# Patient Record
Sex: Male | Born: 1955 | Race: Black or African American | Hispanic: No | Smoking: Former smoker
Health system: Southern US, Community
[De-identification: ages and names within clinical notes are randomized; demographics above are authoritative.]

## PROBLEM LIST (undated history)

## (undated) DIAGNOSIS — M109 Gout, unspecified: Secondary | ICD-10-CM

## (undated) DIAGNOSIS — E119 Type 2 diabetes mellitus without complications: Secondary | ICD-10-CM

## (undated) DIAGNOSIS — N189 Chronic kidney disease, unspecified: Secondary | ICD-10-CM

## (undated) DIAGNOSIS — I10 Essential (primary) hypertension: Secondary | ICD-10-CM

## (undated) DIAGNOSIS — E785 Hyperlipidemia, unspecified: Secondary | ICD-10-CM

## (undated) DIAGNOSIS — N183 Chronic kidney disease, stage 3 (moderate): Secondary | ICD-10-CM

## (undated) DIAGNOSIS — I129 Hypertensive chronic kidney disease with stage 1 through stage 4 chronic kidney disease, or unspecified chronic kidney disease: Secondary | ICD-10-CM

## (undated) HISTORY — PX: PROSTATE SURGERY: SHX751

## (undated) HISTORY — DX: Hyperlipidemia, unspecified: E78.5

## (undated) HISTORY — DX: Type 2 diabetes mellitus without complications: E11.9

## (undated) HISTORY — DX: Essential (primary) hypertension: I10

## (undated) HISTORY — DX: Hypertensive chronic kidney disease with stage 1 through stage 4 chronic kidney disease, or unspecified chronic kidney disease: I12.9

## (undated) HISTORY — DX: Gout, unspecified: M10.9

## (undated) HISTORY — DX: Chronic kidney disease, stage 3 (moderate): N18.3

## (undated) HISTORY — DX: Chronic kidney disease, unspecified: N18.9

## (undated) HISTORY — PX: OTHER SURGICAL HISTORY: SHX169

---

## 2010-09-20 LAB — HM COLONOSCOPY

## 2013-10-26 ENCOUNTER — Ambulatory Visit: Payer: Self-pay | Admitting: Family Medicine

## 2013-10-26 LAB — BASIC METABOLIC PANEL
ANION GAP: 8 (ref 7–16)
BUN: 13 mg/dL (ref 7–18)
CO2: 25 mmol/L (ref 21–32)
CREATININE: 1.46 mg/dL — AB (ref 0.60–1.30)
Calcium, Total: 9 mg/dL (ref 8.5–10.1)
Chloride: 105 mmol/L (ref 98–107)
EGFR (African American): >60
EGFR (Non-African Amer.): 52 — ABNORMAL LOW
Glucose: 113 mg/dL — ABNORMAL HIGH (ref 65–99)
Osmolality: 277 (ref 275–301)
POTASSIUM: 4.2 mmol/L (ref 3.5–5.1)
Sodium: 138 mmol/L (ref 136–145)

## 2014-10-19 LAB — PSA

## 2015-03-06 ENCOUNTER — Other Ambulatory Visit: Payer: Self-pay | Admitting: Unknown Physician Specialty

## 2015-03-07 NOTE — Telephone Encounter (Signed)
Creatinine just under 1.5 for the last year and a half; reviewed last kidney doctor note; last visit Feb; next visit August; Rx approved

## 2015-04-19 ENCOUNTER — Ambulatory Visit: Payer: Self-pay | Admitting: Unknown Physician Specialty

## 2015-04-24 ENCOUNTER — Ambulatory Visit (INDEPENDENT_AMBULATORY_CARE_PROVIDER_SITE_OTHER): Payer: BLUE CROSS/BLUE SHIELD | Admitting: Unknown Physician Specialty

## 2015-04-24 ENCOUNTER — Encounter: Payer: Self-pay | Admitting: Unknown Physician Specialty

## 2015-04-24 VITALS — BP 152/94 | HR 97 | Temp 99.1°F | Ht 69.6 in | Wt 216.0 lb

## 2015-04-24 DIAGNOSIS — N183 Chronic kidney disease, stage 3 unspecified: Secondary | ICD-10-CM | POA: Insufficient documentation

## 2015-04-24 DIAGNOSIS — I129 Hypertensive chronic kidney disease with stage 1 through stage 4 chronic kidney disease, or unspecified chronic kidney disease: Secondary | ICD-10-CM | POA: Insufficient documentation

## 2015-04-24 DIAGNOSIS — R7301 Impaired fasting glucose: Secondary | ICD-10-CM

## 2015-04-24 DIAGNOSIS — E785 Hyperlipidemia, unspecified: Secondary | ICD-10-CM | POA: Diagnosis not present

## 2015-04-24 DIAGNOSIS — N189 Chronic kidney disease, unspecified: Secondary | ICD-10-CM | POA: Diagnosis not present

## 2015-04-24 DIAGNOSIS — E1169 Type 2 diabetes mellitus with other specified complication: Secondary | ICD-10-CM | POA: Insufficient documentation

## 2015-04-24 DIAGNOSIS — N1832 Chronic kidney disease, stage 3b: Secondary | ICD-10-CM | POA: Insufficient documentation

## 2015-04-24 DIAGNOSIS — E119 Type 2 diabetes mellitus without complications: Secondary | ICD-10-CM | POA: Diagnosis not present

## 2015-04-24 DIAGNOSIS — Z8739 Personal history of other diseases of the musculoskeletal system and connective tissue: Secondary | ICD-10-CM | POA: Insufficient documentation

## 2015-04-24 DIAGNOSIS — M109 Gout, unspecified: Secondary | ICD-10-CM

## 2015-04-24 DIAGNOSIS — E1159 Type 2 diabetes mellitus with other circulatory complications: Secondary | ICD-10-CM | POA: Insufficient documentation

## 2015-04-24 DIAGNOSIS — I10 Essential (primary) hypertension: Secondary | ICD-10-CM

## 2015-04-24 DIAGNOSIS — R7309 Other abnormal glucose: Secondary | ICD-10-CM

## 2015-04-24 HISTORY — DX: Hypertensive chronic kidney disease with stage 1 through stage 4 chronic kidney disease, or unspecified chronic kidney disease: I12.9

## 2015-04-24 HISTORY — DX: Type 2 diabetes mellitus without complications: E11.9

## 2015-04-24 HISTORY — DX: Chronic kidney disease, stage 3 unspecified: N18.30

## 2015-04-24 HISTORY — DX: Gout, unspecified: M10.9

## 2015-04-24 LAB — LIPID PANEL PICCOLO, WAIVED
CHOLESTEROL PICCOLO, WAIVED: 173 mg/dL (ref <200)
Chol/HDL Ratio Piccolo,Waive: 4.3 mg/dL
HDL Chol Piccolo, Waived: 40 mg/dL — ABNORMAL LOW (ref >59)
LDL Chol Calc Piccolo Waived: 61 mg/dL (ref <100)
TRIGLYCERIDES PICCOLO,WAIVED: 357 mg/dL — AB (ref <150)
VLDL Chol Calc Piccolo,Waive: 71 mg/dL — ABNORMAL HIGH (ref <30)

## 2015-04-24 LAB — BAYER DCA HB A1C WAIVED: HB A1C (BAYER DCA - WAIVED): 6 % (ref <7.0)

## 2015-04-24 NOTE — Progress Notes (Signed)
BP 152/94 mmHg  Pulse 97  Temp(Src) 99.1 F (37.3 C)  Ht 5' 9.6" (1.768 m)  Wt 216 lb (97.977 kg)  BMI 31.34 kg/m2  SpO2 97%   Subjective:    Patient ID: Fernando Frederick, male    DOB: 26-Nov-1955, 59 y.o.   MRN: [id number]  HPI: Fernando Frederick is a 59 y.o. male  Chief Complaint  Patient presents with  . Diabetes  . Hyperlipidemia  . Hypertension   1.  HYPERTENSION / HYPERLIPIDEMIA BP is elevated.  States it was a stressful day at work.   Patient is requesting refill(s). Satisfied with current treatment?  yes  H6  Duration of hypertension:  chronic  H4  BP monitoring frequency:  not checking.  H5  Duration of hyperlipidemia:  chronic  H4  Aspirin:  Aspirin Therapy Not applicable on 93/57/01 Recent stressors:  no  H6   Recurrent headaches:  no  R10 Visual changes:  no  R2  Palpitations:  no  R4  Dyspnea:  no  R5  Chest pain:  no  R4  Lower extremity edema:  no  R4 I do note a Creatnine of below 60 indicating CKD stage 3  Hyperglycemia Taking Metformin elevated blood sugar.  Hgb A1C is 6.0   Relevant past medical, surgical, family and social history reviewed and updated as indicated. Interim medical history since our last visit reviewed. Allergies and medications reviewed and updated.  Review of Systems  Per HPI unless specifically indicated above     Objective:    BP 152/94 mmHg  Pulse 97  Temp(Src) 99.1 F (37.3 C)  Ht 5' 9.6" (1.768 m)  Wt 216 lb (97.977 kg)  BMI 31.34 kg/m2  SpO2 97%  Wt Readings from Last 3 Encounters:  04/24/15 216 lb (97.977 kg)  10/19/14 211 lb (95.709 kg)    Physical Exam  Constitutional: He is oriented to person, place, and time. He appears well-developed and well-nourished. No distress.  HENT:  Head: Normocephalic and atraumatic.  Eyes: Conjunctivae and lids are normal. Right eye exhibits no discharge. Left eye exhibits no discharge. No scleral icterus.  Cardiovascular: Normal rate, regular rhythm and normal heart sounds.    Pulmonary/Chest: Effort normal and breath sounds normal. No respiratory distress.  Abdominal: Normal appearance and bowel sounds are normal. There is no splenomegaly or hepatomegaly.  Musculoskeletal: Normal range of motion.  Neurological: He is alert and oriented to person, place, and time.  Skin: Skin is intact. No rash noted. No pallor.  Psychiatric: He has a normal mood and affect. His behavior is normal. Judgment and thought content normal.      Assessment & Plan:   Problem List Items Addressed This Visit      Unprioritized   Benign hypertension with chronic kidney disease    Stable today.  Continue present medications      Relevant Orders   Comprehensive metabolic panel   Hyperlipidemia - Primary    Reviewed lipid level.  OK for non-fasting      Relevant Orders   Lipid Panel Piccolo, Waived   CKD (chronic kidney disease), stage III   Relevant Orders   Bayer DCA Hb A1c Waived   Diabetes    Hgb A1C is 6.0.  Continue present treatment       Other Visit Diagnoses    Impaired fasting glucose            Follow up plan: Return in about 6 months (around 10/24/2015) for physical.

## 2015-04-24 NOTE — Assessment & Plan Note (Signed)
Hgb A1C is 6.0.  Continue present treatment

## 2015-04-24 NOTE — Assessment & Plan Note (Signed)
Reviewed lipid level.  OK for non-fasting

## 2015-04-24 NOTE — Assessment & Plan Note (Signed)
Stable today.  Continue present medications 

## 2015-04-25 LAB — COMPREHENSIVE METABOLIC PANEL
A/G RATIO: 1.4 (ref 1.1–2.5)
ALT: 21 IU/L (ref 0–44)
AST: 21 IU/L (ref 0–40)
Albumin: 4.5 g/dL (ref 3.6–4.8)
Alkaline Phosphatase: 90 IU/L (ref 39–117)
BILIRUBIN TOTAL: 0.4 mg/dL (ref 0.0–1.2)
BUN/Creatinine Ratio: 12 (ref 10–22)
BUN: 16 mg/dL (ref 8–27)
CALCIUM: 10 mg/dL (ref 8.6–10.2)
CHLORIDE: 100 mmol/L (ref 97–108)
CO2: 21 mmol/L (ref 18–29)
Creatinine, Ser: 1.39 mg/dL — ABNORMAL HIGH (ref 0.76–1.27)
GFR calc Af Amer: 63 mL/min/{1.73_m2} (ref >59)
GFR, EST NON AFRICAN AMERICAN: 54 mL/min/{1.73_m2} — AB (ref >59)
Globulin, Total: 3.2 g/dL (ref 1.5–4.5)
Glucose: 99 mg/dL (ref 65–99)
POTASSIUM: 4.2 mmol/L (ref 3.5–5.2)
Sodium: 143 mmol/L (ref 134–144)
Total Protein: 7.7 g/dL (ref 6.0–8.5)

## 2015-05-02 ENCOUNTER — Telehealth: Payer: Self-pay | Admitting: Family Medicine

## 2015-05-02 ENCOUNTER — Ambulatory Visit: Payer: BLUE CROSS/BLUE SHIELD | Admitting: Family Medicine

## 2015-05-04 NOTE — Telephone Encounter (Signed)
errenous °

## 2015-06-26 ENCOUNTER — Other Ambulatory Visit: Payer: Self-pay

## 2015-06-26 NOTE — Telephone Encounter (Signed)
Called to ask patient about what pharmacy he uses because we got a refill request from [facility] and I dont have that listed as his pharmacy. A lady answered the phone and I asked for him to please call me back.

## 2015-06-28 MED ORDER — LISINOPRIL-HYDROCHLOROTHIAZIDE 20-12.5 MG PO TABS: 1.0000 | ORAL_TABLET | Freq: Every day | ORAL | Status: DC

## 2015-06-28 MED ORDER — AMLODIPINE BESYLATE 10 MG PO TABS: 10.0000 mg | ORAL_TABLET | Freq: Every day | ORAL | Status: DC

## 2015-06-28 MED ORDER — METFORMIN HCL 500 MG PO TABS: 500.0000 mg | ORAL_TABLET | Freq: Two times a day (BID) | ORAL | Status: DC

## 2015-06-28 NOTE — Telephone Encounter (Signed)
Called and spoke to patient's wife, who is on HIPPA according to practice partner, and she stated that the patient does use [facility].

## 2015-06-28 NOTE — Telephone Encounter (Signed)
Patient was last seen 04/24/15 and has appointment scheduled for 10/24/15. Pharmacy is [facility].

## 2015-10-24 ENCOUNTER — Ambulatory Visit (INDEPENDENT_AMBULATORY_CARE_PROVIDER_SITE_OTHER): Payer: BLUE CROSS/BLUE SHIELD | Admitting: Unknown Physician Specialty

## 2015-10-24 ENCOUNTER — Encounter: Payer: Self-pay | Admitting: Unknown Physician Specialty

## 2015-10-24 VITALS — BP 142/70 | HR 97 | Temp 98.6°F | Ht 69.1 in | Wt 222.8 lb

## 2015-10-24 DIAGNOSIS — E785 Hyperlipidemia, unspecified: Secondary | ICD-10-CM

## 2015-10-24 DIAGNOSIS — N183 Chronic kidney disease, stage 3 (moderate): Secondary | ICD-10-CM | POA: Diagnosis not present

## 2015-10-24 DIAGNOSIS — N189 Chronic kidney disease, unspecified: Secondary | ICD-10-CM

## 2015-10-24 DIAGNOSIS — E1122 Type 2 diabetes mellitus with diabetic chronic kidney disease: Secondary | ICD-10-CM | POA: Diagnosis not present

## 2015-10-24 DIAGNOSIS — I129 Hypertensive chronic kidney disease with stage 1 through stage 4 chronic kidney disease, or unspecified chronic kidney disease: Secondary | ICD-10-CM | POA: Diagnosis not present

## 2015-10-24 LAB — LIPID PANEL PICCOLO, WAIVED

## 2015-10-24 MED ORDER — ROSUVASTATIN CALCIUM 20 MG PO TABS: 20.0000 mg | ORAL_TABLET | Freq: Every day | ORAL | Status: DC

## 2015-10-24 MED ORDER — METFORMIN HCL 500 MG PO TABS: 500.0000 mg | ORAL_TABLET | Freq: Two times a day (BID) | ORAL | Status: DC

## 2015-10-24 MED ORDER — AMLODIPINE BESYLATE 10 MG PO TABS: 10.0000 mg | ORAL_TABLET | Freq: Every day | ORAL | Status: DC

## 2015-10-24 NOTE — Assessment & Plan Note (Signed)
Stable, continue present medications.   

## 2015-10-24 NOTE — Assessment & Plan Note (Signed)
Sample sent out as is lipemic and non-fasting.  Continue present meds

## 2015-10-24 NOTE — Assessment & Plan Note (Signed)
Hgb A1C is 6.1.  Continue present meds

## 2015-10-24 NOTE — Progress Notes (Signed)
Q`-  BP 142/70 mmHg  Pulse 97  Temp(Src) 98.6 F (37 C)  Ht 5' 9.1" (1.755 m)  Wt 222 lb 12.8 oz (101.061 kg)  BMI 32.81 kg/m2  SpO2 96%   Subjective:    Patient ID: Fernando Frederick, male    DOB: 04/25/1956, 60 y.o.   MRN: [id number]  HPI: Fernando Frederick is a 60 y.o. male  Chief Complaint  Patient presents with  . Diabetes  . Hyperlipidemia  . Hypertension   Diabetes:  Using medications without difficulties Non checking blood sugar Feet problems: none Blood Sugars averaging:not checking eye exam within last year  Hypertension:  Using medications without difficulty Average home BPs Not checking   Using medication without problems or lightheadedness No chest pain with exertion or shortness of breath No Edema  Elevated Cholesterol: Using medications without problems: No Muscle aches:    Relevant past medical, surgical, family and social history reviewed and updated as indicated. Interim medical history since our last visit reviewed. Allergies and medications reviewed and updated.  Review of Systems  Constitutional: Negative.   HENT: Negative.   Eyes: Negative.   Respiratory: Negative.   Cardiovascular: Negative.   Gastrointestinal: Negative.   Endocrine: Negative.   Genitourinary: Negative.   Skin: Negative.   Allergic/Immunologic: Negative.   Neurological: Negative.   Hematological: Negative.   Psychiatric/Behavioral: Negative.     Per HPI unless specifically indicated above     Objective:    BP 142/70 mmHg  Pulse 97  Temp(Src) 98.6 F (37 C)  Ht 5' 9.1" (1.755 m)  Wt 222 lb 12.8 oz (101.061 kg)  BMI 32.81 kg/m2  SpO2 96%  Wt Readings from Last 3 Encounters:  10/24/15 222 lb 12.8 oz (101.061 kg)  04/24/15 216 lb (97.977 kg)  10/19/14 211 lb (95.709 kg)    Physical Exam  Constitutional: He is oriented to person, place, and time. He appears well-developed and well-nourished. No distress.  HENT:  Head: Normocephalic and atraumatic.  Eyes:  Conjunctivae and lids are normal. Right eye exhibits no discharge. Left eye exhibits no discharge. No scleral icterus.  Neck: Normal range of motion. Neck supple. No JVD present. Carotid bruit is not present.  Cardiovascular: Normal rate, regular rhythm and normal heart sounds.   Pulmonary/Chest: Effort normal and breath sounds normal. No respiratory distress.  Abdominal: Normal appearance. There is no splenomegaly or hepatomegaly.  Musculoskeletal: Normal range of motion.  Neurological: He is alert and oriented to person, place, and time.  Skin: Skin is warm, dry and intact. No rash noted. No pallor.  Psychiatric: He has a normal mood and affect. His behavior is normal. Judgment and thought content normal.    Results for orders placed or performed in visit on 04/24/15  Lipid Panel Piccolo, [employer]  Result Value Ref Range   Cholesterol Piccolo, Waived 173 <200 mg/dL   HDL Chol Piccolo, Waived 40 (L) >59 mg/dL   Triglycerides Piccolo,Waived 357 (H) <150 mg/dL   Chol/HDL Ratio Piccolo,Waive 4.3 mg/dL   LDL Chol Calc Piccolo Waived 61 <100 mg/dL   VLDL Chol Calc Piccolo,Waive 71 (H) <30 mg/dL  Bayer DCA Hb A1c Waived  Result Value Ref Range   Bayer DCA Hb A1c Waived 6.0 <7.0 %  Comprehensive metabolic panel  Result Value Ref Range   Glucose 99 65 - 99 mg/dL   BUN 16 8 - 27 mg/dL   Creatinine, Ser 1.39 (H) 0.76 - 1.27 mg/dL   GFR calc non Af Amer 54 (L) >  59 mL/min/1.73   GFR calc Af Amer 63 >59 mL/min/1.73   BUN/Creatinine Ratio 12 10 - 22   Sodium 143 134 - 144 mmol/L   Potassium 4.2 3.5 - 5.2 mmol/L   Chloride 100 97 - 108 mmol/L   CO2 21 18 - 29 mmol/L   Calcium 10.0 8.6 - 10.2 mg/dL   Total Protein 7.7 6.0 - 8.5 g/dL   Albumin 4.5 3.6 - 4.8 g/dL   Globulin, Total 3.2 1.5 - 4.5 g/dL   Albumin/Globulin Ratio 1.4 1.1 - 2.5   Bilirubin Total 0.4 0.0 - 1.2 mg/dL   Alkaline Phosphatase 90 39 - 117 IU/L   AST 21 0 - 40 IU/L   ALT 21 0 - 44 IU/L      Assessment & Plan:    Problem List Items Addressed This Visit      Unprioritized   Benign hypertension with chronic kidney disease    Stable, continue present medications.        Relevant Medications   amLODipine (NORVASC) 10 MG tablet   rosuvastatin (CRESTOR) 20 MG tablet   Other Relevant Orders   Uric acid   Hyperlipidemia    Sample sent out as is lipemic and non-fasting.  Continue present meds      Relevant Medications   amLODipine (NORVASC) 10 MG tablet   rosuvastatin (CRESTOR) 20 MG tablet   Other Relevant Orders   Lipid Panel Piccolo, Waived   Diabetes ([place]) - Primary    Hgb A1C is 6.1.  Continue present meds      Relevant Medications   metFORMIN (GLUCOPHAGE) 500 MG tablet   rosuvastatin (CRESTOR) 20 MG tablet   Other Relevant Orders   Comprehensive metabolic panel   Bayer DCA Hb A1c Waived       Follow up plan: Return in about 6 months (around 04/22/2016) for physical.

## 2015-10-25 LAB — LIPID PANEL W/O CHOL/HDL RATIO
Cholesterol, Total: 155 mg/dL (ref 100–199)
HDL: 33 mg/dL — AB (ref >39)
TRIGLYCERIDES: 459 mg/dL — AB (ref 0–149)

## 2015-10-25 LAB — COMPREHENSIVE METABOLIC PANEL
ALK PHOS: 72 IU/L (ref 39–117)
ALT: 36 IU/L (ref 0–44)
AST: 34 IU/L (ref 0–40)
Albumin/Globulin Ratio: 1.6 (ref 1.1–2.5)
Albumin: 4.7 g/dL (ref 3.6–4.8)
BILIRUBIN TOTAL: 0.2 mg/dL (ref 0.0–1.2)
BUN/Creatinine Ratio: 14 (ref 10–22)
BUN: 23 mg/dL (ref 8–27)
CHLORIDE: 103 mmol/L (ref 96–106)
CO2: 21 mmol/L (ref 18–29)
CREATININE: 1.67 mg/dL — AB (ref 0.76–1.27)
Calcium: 9.9 mg/dL (ref 8.6–10.2)
GFR calc Af Amer: 50 mL/min/{1.73_m2} — ABNORMAL LOW (ref >59)
GFR calc non Af Amer: 44 mL/min/{1.73_m2} — ABNORMAL LOW (ref >59)
GLUCOSE: 102 mg/dL — AB (ref 65–99)
Globulin, Total: 3 g/dL (ref 1.5–4.5)
Potassium: 4.8 mmol/L (ref 3.5–5.2)
Sodium: 141 mmol/L (ref 134–144)
Total Protein: 7.7 g/dL (ref 6.0–8.5)

## 2015-10-25 LAB — SPECIMEN STATUS REPORT

## 2015-10-25 LAB — URIC ACID: URIC ACID: 10.7 mg/dL — AB (ref 3.7–8.6)

## 2015-10-26 LAB — BAYER DCA HB A1C WAIVED: HB A1C: 6.1 % (ref <7.0)

## 2016-01-09 ENCOUNTER — Other Ambulatory Visit: Payer: Self-pay | Admitting: Unknown Physician Specialty

## 2016-01-12 ENCOUNTER — Other Ambulatory Visit: Payer: Self-pay

## 2016-01-12 NOTE — Telephone Encounter (Signed)
Opened in error

## 2016-02-29 ENCOUNTER — Other Ambulatory Visit: Payer: Self-pay | Admitting: Unknown Physician Specialty

## 2016-03-02 ENCOUNTER — Other Ambulatory Visit: Payer: Self-pay | Admitting: Unknown Physician Specialty

## 2016-03-11 ENCOUNTER — Other Ambulatory Visit: Payer: Self-pay | Admitting: Unknown Physician Specialty

## 2016-04-23 ENCOUNTER — Other Ambulatory Visit: Payer: Self-pay

## 2016-04-23 ENCOUNTER — Ambulatory Visit (INDEPENDENT_AMBULATORY_CARE_PROVIDER_SITE_OTHER): Payer: BLUE CROSS/BLUE SHIELD | Admitting: Unknown Physician Specialty

## 2016-04-23 ENCOUNTER — Encounter: Payer: Self-pay | Admitting: Unknown Physician Specialty

## 2016-04-23 VITALS — BP 145/88 | HR 93 | Temp 99.0°F | Ht 69.5 in | Wt 213.6 lb

## 2016-04-23 DIAGNOSIS — E1122 Type 2 diabetes mellitus with diabetic chronic kidney disease: Secondary | ICD-10-CM | POA: Diagnosis not present

## 2016-04-23 DIAGNOSIS — N183 Chronic kidney disease, stage 3 unspecified: Secondary | ICD-10-CM

## 2016-04-23 DIAGNOSIS — E785 Hyperlipidemia, unspecified: Secondary | ICD-10-CM | POA: Diagnosis not present

## 2016-04-23 DIAGNOSIS — I10 Essential (primary) hypertension: Secondary | ICD-10-CM

## 2016-04-23 DIAGNOSIS — Z0000 Encounter for general adult medical examination without abnormal findings: Secondary | ICD-10-CM

## 2016-04-23 DIAGNOSIS — I129 Hypertensive chronic kidney disease with stage 1 through stage 4 chronic kidney disease, or unspecified chronic kidney disease: Secondary | ICD-10-CM | POA: Diagnosis not present

## 2016-04-23 DIAGNOSIS — N189 Chronic kidney disease, unspecified: Secondary | ICD-10-CM

## 2016-04-23 LAB — LIPID PANEL PICCOLO, WAIVED
CHOL/HDL RATIO PICCOLO,WAIVE: 2.9 mg/dL
CHOLESTEROL PICCOLO, WAIVED: 152 mg/dL (ref <200)
HDL Chol Piccolo, Waived: 53 mg/dL — ABNORMAL LOW (ref >59)
LDL Chol Calc Piccolo Waived: 66 mg/dL (ref <100)
TRIGLYCERIDES PICCOLO,WAIVED: 164 mg/dL — AB (ref <150)
VLDL Chol Calc Piccolo,Waive: 33 mg/dL — ABNORMAL HIGH (ref <30)

## 2016-04-23 LAB — HEMOGLOBIN A1C: Hemoglobin A1C: 6.4

## 2016-04-23 LAB — BAYER DCA HB A1C WAIVED: HB A1C: 6.4 % (ref <7.0)

## 2016-04-23 NOTE — Assessment & Plan Note (Signed)
Hgb A1C is 6.4.  Continue present medications

## 2016-04-23 NOTE — Assessment & Plan Note (Signed)
Stable, continue present medications.   

## 2016-04-23 NOTE — Progress Notes (Signed)
BP (!) 145/88 (BP Location: Left Arm, Cuff Size: Large)   Pulse 93   Temp 99 F (37.2 C)   Ht 5' 9.5" (1.765 m)   Wt 213 lb 9.6 oz (96.9 kg)   SpO2 98%   BMI 31.09 kg/m    Subjective:    Patient ID: Fernando Frederick, male    DOB: 1955/08/28, 59 y.o.   MRN: [id number]  HPI: Fernando Frederick is a 60 y.o. male  Chief Complaint  Patient presents with  . Annual Exam    HIV and Hep C orders entered   . Diabetes    pt states last eye exam in chart is correct    Pt is here for annual exam  Diabetes: Using medications without difficulties No hypoglycemic episodes No hyperglycemic episodes Feet problems: none Blood Sugars averaging:Not checking eye exam within last year no Last Hgb A1C: 6.1  Hypertension  Using medications without difficulty Average home BPs: not checking  Using medication without problems or lightheadedness No chest pain with exertion or shortness of breath No Edema  Elevated Cholesterol Using medications without problems No Muscle aches  Diet: Regular Exercise: Bicycle    Depression screen PHQ 2/9 04/23/2016  Decreased Interest 0  Down, Depressed, Hopeless 0  PHQ - 2 Score 0     Relevant past medical, surgical, family and social history reviewed and updated as indicated. Interim medical history since our last visit reviewed. Allergies and medications reviewed and updated.  Review of Systems  Constitutional: Negative.   HENT: Negative.   Eyes: Negative.   Respiratory: Negative.   Cardiovascular: Negative.   Gastrointestinal: Negative.   Endocrine: Negative.   Genitourinary: Negative.   Skin: Negative.   Allergic/Immunologic: Negative.   Neurological: Negative.   Hematological: Negative.   Psychiatric/Behavioral: Negative.     Per HPI unless specifically indicated above     Objective:    BP (!) 145/88 (BP Location: Left Arm, Cuff Size: Large)   Pulse 93   Temp 99 F (37.2 C)   Ht 5' 9.5" (1.765 m)   Wt 213 lb 9.6 oz (96.9 kg)    SpO2 98%   BMI 31.09 kg/m   Wt Readings from Last 3 Encounters:  04/23/16 213 lb 9.6 oz (96.9 kg)  10/24/15 222 lb 12.8 oz (101.1 kg)  04/24/15 216 lb (98 kg)    Physical Exam  Constitutional: He is oriented to person, place, and time. He appears well-developed and well-nourished.  HENT:  Head: Normocephalic.  Right Ear: Tympanic membrane, external ear and ear canal normal.  Left Ear: Tympanic membrane, external ear and ear canal normal.  Mouth/Throat: Uvula is midline, oropharynx is clear and moist and mucous membranes are normal.  Eyes: Pupils are equal, round, and reactive to light.  Cardiovascular: Normal rate, regular rhythm and normal heart sounds.  Exam reveals no gallop and no friction rub.   No murmur heard. Pulmonary/Chest: Effort normal and breath sounds normal. No respiratory distress.  Abdominal: Soft. Bowel sounds are normal. He exhibits no distension. There is no tenderness.  Genitourinary: Rectum normal and prostate normal.  Musculoskeletal: Normal range of motion.  Neurological: He is alert and oriented to person, place, and time. He has normal reflexes.  Skin: Skin is warm and dry.  Psychiatric: He has a normal mood and affect. His behavior is normal. Judgment and thought content normal.    Results for orders placed or performed in visit on 10/24/15  Uric acid  Result Value Ref Range  Uric Acid 10.7 (H) 3.7 - 8.6 mg/dL  Comprehensive metabolic panel  Result Value Ref Range   Glucose 102 (H) 65 - 99 mg/dL   BUN 23 8 - 27 mg/dL   Creatinine, Ser 1.67 (H) 0.76 - 1.27 mg/dL   GFR calc non Af Amer 44 (L) >59 mL/min/1.73   GFR calc Af Amer 50 (L) >59 mL/min/1.73   BUN/Creatinine Ratio 14 10 - 22   Sodium 141 134 - 144 mmol/L   Potassium 4.8 3.5 - 5.2 mmol/L   Chloride 103 96 - 106 mmol/L   CO2 21 18 - 29 mmol/L   Calcium 9.9 8.6 - 10.2 mg/dL   Total Protein 7.7 6.0 - 8.5 g/dL   Albumin 4.7 3.6 - 4.8 g/dL   Globulin, Total 3.0 1.5 - 4.5 g/dL    Albumin/Globulin Ratio 1.6 1.1 - 2.5   Bilirubin Total 0.2 0.0 - 1.2 mg/dL   Alkaline Phosphatase 72 39 - 117 IU/L   AST 34 0 - 40 IU/L   ALT 36 0 - 44 IU/L  Bayer DCA Hb A1c Waived  Result Value Ref Range   Bayer DCA Hb A1c Waived 6.1 <7.0 %  Lipid Panel Piccolo, Waived  Result Value Ref Range   Cholesterol Piccolo, Waived WILL FOLLOW    HDL Chol Piccolo, Waived WILL FOLLOW    Triglycerides Piccolo,Waived WILL FOLLOW    Chol/HDL Ratio Piccolo,Waive WILL FOLLOW    LDL Chol Calc Piccolo Waived WILL FOLLOW    VLDL Chol Calc Piccolo,Waive WILL FOLLOW   Lipid Panel w/o Chol/HDL Ratio  Result Value Ref Range   Cholesterol, Total 155 100 - 199 mg/dL   Triglycerides 459 (H) 0 - 149 mg/dL   HDL 33 (L) >39 mg/dL   VLDL Cholesterol Cal Comment 5 - 40 mg/dL   LDL Calculated Comment 0 - 99 mg/dL  Specimen status report  Result Value Ref Range   specimen status report Comment       Assessment & Plan:   Problem List Items Addressed This Visit      Unprioritized   Benign hypertension with chronic kidney disease    Stable, continue present medications.        CKD (chronic kidney disease), stage III    Lost to f/u with Nephrology.  Check GFR      Relevant Orders   Comprehensive metabolic panel   Diabetes ([facility])    Hgb A1C is 6.4.  Continue present medications      Relevant Orders   Bayer DCA Hb A1c Waived   Hyperlipidemia    Stable, continue present medications.        Relevant Orders   Lipid Panel Piccolo, Waived    Other Visit Diagnoses    Health care maintenance    -  Primary   Relevant Orders   Hepatitis C antibody   HIV antibody   TSH   Comprehensive metabolic panel   CBC with Differential/Platelet   PSA   Essential hypertension           Follow up plan: Return in about 6 months (around 10/23/2016).

## 2016-04-23 NOTE — Assessment & Plan Note (Signed)
Lost to f/u with Nephrology.  Check GFR

## 2016-04-24 ENCOUNTER — Encounter: Payer: Self-pay | Admitting: Family Medicine

## 2016-04-24 LAB — COMPREHENSIVE METABOLIC PANEL
ALBUMIN: 4.8 g/dL (ref 3.6–4.8)
ALT: 19 IU/L (ref 0–44)
AST: 25 IU/L (ref 0–40)
Albumin/Globulin Ratio: 1.7 (ref 1.2–2.2)
Alkaline Phosphatase: 72 IU/L (ref 39–117)
BUN / CREAT RATIO: 13 (ref 10–24)
BUN: 20 mg/dL (ref 8–27)
Bilirubin Total: 0.3 mg/dL (ref 0.0–1.2)
CALCIUM: 9.9 mg/dL (ref 8.6–10.2)
CO2: 22 mmol/L (ref 18–29)
CREATININE: 1.56 mg/dL — AB (ref 0.76–1.27)
Chloride: 101 mmol/L (ref 96–106)
GFR, EST AFRICAN AMERICAN: 54 mL/min/{1.73_m2} — AB (ref >59)
GFR, EST NON AFRICAN AMERICAN: 47 mL/min/{1.73_m2} — AB (ref >59)
GLOBULIN, TOTAL: 2.8 g/dL (ref 1.5–4.5)
Glucose: 103 mg/dL — ABNORMAL HIGH (ref 65–99)
Potassium: 5 mmol/L (ref 3.5–5.2)
SODIUM: 140 mmol/L (ref 134–144)
Total Protein: 7.6 g/dL (ref 6.0–8.5)

## 2016-04-24 LAB — CBC WITH DIFFERENTIAL/PLATELET
BASOS: 0 %
Basophils Absolute: 0 10*3/uL (ref 0.0–0.2)
EOS (ABSOLUTE): 0.3 10*3/uL (ref 0.0–0.4)
EOS: 5 %
HEMATOCRIT: 38 % (ref 37.5–51.0)
Hemoglobin: 12.7 g/dL (ref 12.6–17.7)
IMMATURE GRANULOCYTES: 0 %
Immature Grans (Abs): 0 10*3/uL (ref 0.0–0.1)
LYMPHS ABS: 2 10*3/uL (ref 0.7–3.1)
Lymphs: 36 %
MCH: 27.1 pg (ref 26.6–33.0)
MCHC: 33.4 g/dL (ref 31.5–35.7)
MCV: 81 fL (ref 79–97)
MONOS ABS: 0.5 10*3/uL (ref 0.1–0.9)
Monocytes: 9 %
NEUTROS ABS: 2.6 10*3/uL (ref 1.4–7.0)
Neutrophils: 50 %
Platelets: 320 10*3/uL (ref 150–379)
RBC: 4.68 x10E6/uL (ref 4.14–5.80)
RDW: 14.1 % (ref 12.3–15.4)
WBC: 5.4 10*3/uL (ref 3.4–10.8)

## 2016-04-24 LAB — TSH: TSH: 0.948 u[IU]/mL (ref 0.450–4.500)

## 2016-04-24 LAB — HIV ANTIBODY (ROUTINE TESTING W REFLEX): HIV Screen 4th Generation wRfx: NONREACTIVE

## 2016-04-24 LAB — PSA: Prostate Specific Ag, Serum: 3.1 ng/mL (ref 0.0–4.0)

## 2016-04-24 LAB — HEPATITIS C ANTIBODY: Hep C Virus Ab: <0.1 s/co ratio (ref 0.0–0.9)

## 2016-06-05 ENCOUNTER — Other Ambulatory Visit: Payer: Self-pay | Admitting: Unknown Physician Specialty

## 2016-06-05 MED ORDER — LISINOPRIL-HYDROCHLOROTHIAZIDE 20-12.5 MG PO TABS: 1.0000 | ORAL_TABLET | Freq: Every day | ORAL | 1 refills | Status: DC

## 2016-06-05 MED ORDER — ROSUVASTATIN CALCIUM 20 MG PO TABS: 20.0000 mg | ORAL_TABLET | Freq: Every day | ORAL | 1 refills | Status: DC

## 2016-06-05 MED ORDER — METFORMIN HCL 500 MG PO TABS: 500.0000 mg | ORAL_TABLET | Freq: Two times a day (BID) | ORAL | 1 refills | Status: DC

## 2016-06-05 MED ORDER — AMLODIPINE BESYLATE 10 MG PO TABS: 10.0000 mg | ORAL_TABLET | Freq: Every day | ORAL | 1 refills | Status: DC

## 2016-06-05 NOTE — Telephone Encounter (Signed)
Pt's wife called stated the patient would like all of his prescriptions sent to [facility]. They are no longer using [facility] due to insurance purposes. Thanks.

## 2016-06-05 NOTE — Telephone Encounter (Signed)
Routing to provider  

## 2016-10-25 ENCOUNTER — Ambulatory Visit: Payer: BLUE CROSS/BLUE SHIELD | Admitting: Unknown Physician Specialty

## 2016-10-28 ENCOUNTER — Encounter: Payer: Self-pay | Admitting: Unknown Physician Specialty

## 2016-10-28 ENCOUNTER — Ambulatory Visit (INDEPENDENT_AMBULATORY_CARE_PROVIDER_SITE_OTHER): Payer: BLUE CROSS/BLUE SHIELD | Admitting: Unknown Physician Specialty

## 2016-10-28 VITALS — BP 138/87 | HR 87 | Temp 99.2°F | Ht 69.5 in | Wt 214.7 lb

## 2016-10-28 DIAGNOSIS — E785 Hyperlipidemia, unspecified: Secondary | ICD-10-CM | POA: Diagnosis not present

## 2016-10-28 DIAGNOSIS — N183 Chronic kidney disease, stage 3 (moderate): Secondary | ICD-10-CM | POA: Diagnosis not present

## 2016-10-28 DIAGNOSIS — E1122 Type 2 diabetes mellitus with diabetic chronic kidney disease: Secondary | ICD-10-CM

## 2016-10-28 DIAGNOSIS — I129 Hypertensive chronic kidney disease with stage 1 through stage 4 chronic kidney disease, or unspecified chronic kidney disease: Secondary | ICD-10-CM | POA: Diagnosis not present

## 2016-10-28 LAB — BAYER DCA HB A1C WAIVED: HB A1C (BAYER DCA - WAIVED): 6.4 % (ref <7.0)

## 2016-10-28 NOTE — Assessment & Plan Note (Signed)
Check lipid panel  

## 2016-10-28 NOTE — Progress Notes (Signed)
BP 138/87 (BP Location: Left Arm, Patient Position: Sitting, Cuff Size: Large)   Pulse 87   Temp 99.2 F (37.3 C)   Ht 5' 9.5" (1.765 m) Comment: pt had shoes on  Wt 214 lb 11.2 oz (97.4 kg) Comment: pt had shoes on  SpO2 97%   BMI 31.25 kg/m    Subjective:    Patient ID: Fernando Frederick, male    DOB: 1955-12-18, 61 y.o.   MRN: [id number]  HPI: Fernando Frederick is a 61 y.o. male  Chief Complaint  Patient presents with  . Diabetes    pt states he had eye exam in January, will fax form to [facility]   . Hyperlipidemia  . Hypertension   Diabetes: Using medications without difficulties No hypoglycemic episodes No hyperglycemic episodes Feet problems: Blood Sugars averaging: Not checking eye exam within last year Last Hgb A1C: 6.4  Hypertension  Using medications without difficulty Average home BPs Not checking   Using medication without problems or lightheadedness No chest pain with exertion or shortness of breath No Edema  Elevated Cholesterol Using medications without problems No Muscle aches  Diet/Exercise: Doing something every once in a while  Relevant past medical, surgical, family and social history reviewed and updated as indicated. Interim medical history since our last visit reviewed. Allergies and medications reviewed and updated.  Review of Systems  Per HPI unless specifically indicated above     Objective:    BP 138/87 (BP Location: Left Arm, Patient Position: Sitting, Cuff Size: Large)   Pulse 87   Temp 99.2 F (37.3 C)   Ht 5' 9.5" (1.765 m) Comment: pt had shoes on  Wt 214 lb 11.2 oz (97.4 kg) Comment: pt had shoes on  SpO2 97%   BMI 31.25 kg/m   Wt Readings from Last 3 Encounters:  10/28/16 214 lb 11.2 oz (97.4 kg)  04/23/16 213 lb 9.6 oz (96.9 kg)  10/24/15 222 lb 12.8 oz (101.1 kg)    Physical Exam  Constitutional: He is oriented to person, place, and time. He appears well-developed and well-nourished. No distress.  HENT:    Head: Normocephalic and atraumatic.  Eyes: Conjunctivae and lids are normal. Right eye exhibits no discharge. Left eye exhibits no discharge. No scleral icterus.  Neck: Normal range of motion. Neck supple. No JVD present. Carotid bruit is not present.  Cardiovascular: Normal rate, regular rhythm and normal heart sounds.   Pulmonary/Chest: Effort normal and breath sounds normal. No respiratory distress.  Abdominal: Normal appearance. There is no splenomegaly or hepatomegaly.  Musculoskeletal: Normal range of motion.  Neurological: He is alert and oriented to person, place, and time.  Skin: Skin is warm, dry and intact. No rash noted. No pallor.  Psychiatric: He has a normal mood and affect. His behavior is normal. Judgment and thought content normal.    Results for orders placed or performed in visit on 04/23/16  Hemoglobin A1c  Result Value Ref Range   Hemoglobin A1C 6.4%       Assessment & Plan:   Problem List Items Addressed This Visit      Unprioritized   Benign hypertension with chronic kidney disease - Primary   Relevant Orders   Comprehensive metabolic panel   Lipid Panel w/o Chol/HDL Ratio   Diabetes ([place])   Relevant Orders   Comprehensive metabolic panel   Bayer DCA Hb A1c Waived   CBC with Differential/Platelet   Hyperlipidemia    Check lipid panel  Follow up plan: Return in about 6 months (around 04/30/2017) for physical.

## 2016-10-29 ENCOUNTER — Encounter: Payer: Self-pay | Admitting: Unknown Physician Specialty

## 2016-10-29 LAB — COMPREHENSIVE METABOLIC PANEL
A/G RATIO: 1.4 (ref 1.2–2.2)
ALK PHOS: 69 IU/L (ref 39–117)
ALT: 15 IU/L (ref 0–44)
AST: 22 IU/L (ref 0–40)
Albumin: 4.5 g/dL (ref 3.6–4.8)
BILIRUBIN TOTAL: 0.3 mg/dL (ref 0.0–1.2)
BUN/Creatinine Ratio: 15 (ref 10–24)
BUN: 19 mg/dL (ref 8–27)
CHLORIDE: 101 mmol/L (ref 96–106)
CO2: 27 mmol/L (ref 18–29)
Calcium: 10 mg/dL (ref 8.6–10.2)
Creatinine, Ser: 1.31 mg/dL — ABNORMAL HIGH (ref 0.76–1.27)
GFR calc Af Amer: 67 mL/min/{1.73_m2} (ref >59)
GFR calc non Af Amer: 58 mL/min/{1.73_m2} — ABNORMAL LOW (ref >59)
GLOBULIN, TOTAL: 3.2 g/dL (ref 1.5–4.5)
Glucose: 92 mg/dL (ref 65–99)
POTASSIUM: 4.4 mmol/L (ref 3.5–5.2)
SODIUM: 143 mmol/L (ref 134–144)
Total Protein: 7.7 g/dL (ref 6.0–8.5)

## 2016-10-29 LAB — CBC WITH DIFFERENTIAL/PLATELET
BASOS: 0 %
Basophils Absolute: 0 10*3/uL (ref 0.0–0.2)
EOS (ABSOLUTE): 0.2 10*3/uL (ref 0.0–0.4)
EOS: 4 %
HEMATOCRIT: 39.7 % (ref 37.5–51.0)
HEMOGLOBIN: 13.3 g/dL (ref 13.0–17.7)
IMMATURE GRANULOCYTES: 0 %
Immature Grans (Abs): 0 10*3/uL (ref 0.0–0.1)
LYMPHS ABS: 2.4 10*3/uL (ref 0.7–3.1)
Lymphs: 43 %
MCH: 26.6 pg (ref 26.6–33.0)
MCHC: 33.5 g/dL (ref 31.5–35.7)
MCV: 79 fL (ref 79–97)
MONOCYTES: 10 %
MONOS ABS: 0.5 10*3/uL (ref 0.1–0.9)
Neutrophils Absolute: 2.4 10*3/uL (ref 1.4–7.0)
Neutrophils: 43 %
Platelets: 333 10*3/uL (ref 150–379)
RBC: 5 x10E6/uL (ref 4.14–5.80)
RDW: 14.3 % (ref 12.3–15.4)
WBC: 5.6 10*3/uL (ref 3.4–10.8)

## 2016-10-29 LAB — LIPID PANEL W/O CHOL/HDL RATIO
CHOLESTEROL TOTAL: 135 mg/dL (ref 100–199)
HDL: 42 mg/dL (ref >39)
LDL Calculated: 68 mg/dL (ref 0–99)
TRIGLYCERIDES: 125 mg/dL (ref 0–149)
VLDL Cholesterol Cal: 25 mg/dL (ref 5–40)

## 2016-12-18 ENCOUNTER — Other Ambulatory Visit: Payer: Self-pay | Admitting: Unknown Physician Specialty

## 2017-01-07 ENCOUNTER — Other Ambulatory Visit: Payer: Self-pay | Admitting: Unknown Physician Specialty

## 2017-01-29 ENCOUNTER — Other Ambulatory Visit: Payer: Self-pay

## 2017-01-29 MED ORDER — LISINOPRIL-HYDROCHLOROTHIAZIDE 20-12.5 MG PO TABS: 1.0000 | ORAL_TABLET | Freq: Every day | ORAL | 1 refills | Status: DC

## 2017-03-31 ENCOUNTER — Other Ambulatory Visit: Payer: Self-pay | Admitting: Unknown Physician Specialty

## 2017-04-07 ENCOUNTER — Other Ambulatory Visit: Payer: Self-pay | Admitting: Unknown Physician Specialty

## 2017-05-02 ENCOUNTER — Ambulatory Visit (INDEPENDENT_AMBULATORY_CARE_PROVIDER_SITE_OTHER): Payer: BLUE CROSS/BLUE SHIELD | Admitting: Unknown Physician Specialty

## 2017-05-02 ENCOUNTER — Encounter: Payer: Self-pay | Admitting: Unknown Physician Specialty

## 2017-05-02 ENCOUNTER — Encounter: Payer: BLUE CROSS/BLUE SHIELD | Admitting: Unknown Physician Specialty

## 2017-05-02 VITALS — BP 136/90 | HR 86 | Temp 99.4°F | Ht 68.7 in | Wt 208.8 lb

## 2017-05-02 DIAGNOSIS — E1122 Type 2 diabetes mellitus with diabetic chronic kidney disease: Secondary | ICD-10-CM | POA: Diagnosis not present

## 2017-05-02 DIAGNOSIS — E785 Hyperlipidemia, unspecified: Secondary | ICD-10-CM

## 2017-05-02 DIAGNOSIS — I129 Hypertensive chronic kidney disease with stage 1 through stage 4 chronic kidney disease, or unspecified chronic kidney disease: Secondary | ICD-10-CM

## 2017-05-02 DIAGNOSIS — N183 Chronic kidney disease, stage 3 unspecified: Secondary | ICD-10-CM

## 2017-05-02 DIAGNOSIS — Z0000 Encounter for general adult medical examination without abnormal findings: Secondary | ICD-10-CM

## 2017-05-02 NOTE — Assessment & Plan Note (Signed)
Check CMP.  ?

## 2017-05-02 NOTE — Assessment & Plan Note (Signed)
This has been stable.  Check Hgb AiC

## 2017-05-02 NOTE — Assessment & Plan Note (Signed)
Stable, continue present medications.   

## 2017-05-02 NOTE — Assessment & Plan Note (Signed)
Check Lipid panel 

## 2017-05-02 NOTE — Progress Notes (Signed)
BP 136/90 (BP Location: Left Arm, Cuff Size: Large)   Pulse 86   Temp 99.4 F (37.4 C)   Ht 5' 8.7" (1.745 m)   Wt 208 lb 12.8 oz (94.7 kg)   SpO2 98%   BMI 31.10 kg/m    Subjective:    Patient ID: Fernando Frederick, male    DOB: 1956/03/05, 61 y.o.   MRN: [id number]  HPI: Fernando Frederick is a 61 y.o. male  Chief Complaint  Patient presents with  . Annual Exam   Diabetes: Using medications without difficulties No hypoglycemic episodes No hyperglycemic episodes Feet problems: Blood Sugars averaging: not checking eye exam within last year Last Hgb A1C: 6.4  Hypertension  Using medications without difficulty Average home BPs not checking  Using medication without problems or lightheadedness No chest pain with exertion or shortness of breath No Edema  Elevated Cholesterol Using medications without problems No Muscle aches  Diet: Exercise: Down a few pounds  Social History   Social History  . Marital status: Married    Spouse name: N/A  . Number of children: N/A  . Years of education: N/A   Occupational History  . Not on file.   Social History Main Topics  . Smoking status: Former Smoker    Quit date: 08/26/2004  . Smokeless tobacco: Never Used  . Alcohol use 3.6 oz/week    6 Cans of beer per week  . Drug use: No  . Sexual activity: Yes   Other Topics Concern  . Not on file   Social History Narrative  . No narrative on file   Family History  Problem Relation Age of Onset  . Cancer Mother        lung  . Hypertension Mother   . Diabetes Father   . Hypertension Father   . Kidney disease Father   . Kidney disease Brother   . Hypertension Sister   . Hypertension Sister   . Hypertension Sister    Past Medical History:  Diagnosis Date  . Chronic kidney disease   . Diabetes mellitus without complication ([place])   . Hyperlipidemia   . Hypertension    Past Surgical History:  Procedure Laterality Date  . partial thumb amputation       Relevant  past medical, surgical, family and social history reviewed and updated as indicated. Interim medical history since our last visit reviewed. Allergies and medications reviewed and updated.  Review of Systems  Constitutional: Negative.   HENT: Negative.   Eyes: Negative.   Respiratory: Negative.   Cardiovascular: Negative.   Gastrointestinal: Negative.   Endocrine: Negative.   Genitourinary: Negative.   Skin: Negative.   Allergic/Immunologic: Negative.   Neurological: Negative.   Hematological: Negative.   Psychiatric/Behavioral: Negative.     Per HPI unless specifically indicated above     Objective:    BP 136/90 (BP Location: Left Arm, Cuff Size: Large)   Pulse 86   Temp 99.4 F (37.4 C)   Ht 5' 8.7" (1.745 m)   Wt 208 lb 12.8 oz (94.7 kg)   SpO2 98%   BMI 31.10 kg/m   Wt Readings from Last 3 Encounters:  05/02/17 208 lb 12.8 oz (94.7 kg)  10/28/16 214 lb 11.2 oz (97.4 kg)  04/23/16 213 lb 9.6 oz (96.9 kg)    Physical Exam  Constitutional: He is oriented to person, place, and time. He appears well-developed and well-nourished.  HENT:  Head: Normocephalic.  Right Ear: Tympanic membrane, external ear  and ear canal normal.  Left Ear: Tympanic membrane, external ear and ear canal normal.  Mouth/Throat: Uvula is midline, oropharynx is clear and moist and mucous membranes are normal.  Eyes: Pupils are equal, round, and reactive to light.  Cardiovascular: Normal rate, regular rhythm and normal heart sounds.  Exam reveals no gallop and no friction rub.   No murmur heard. Pulmonary/Chest: Effort normal and breath sounds normal. No respiratory distress.  Abdominal: Soft. Bowel sounds are normal. He exhibits no distension. There is no tenderness.  Musculoskeletal: Normal range of motion.  Neurological: He is alert and oriented to person, place, and time. He has normal reflexes.  Skin: Skin is warm and dry.  Psychiatric: He has a normal mood and affect. His behavior is  normal. Judgment and thought content normal.    Results for orders placed or performed in visit on 10/28/16  Comprehensive metabolic panel  Result Value Ref Range   Glucose 92 65 - 99 mg/dL   BUN 19 8 - 27 mg/dL   Creatinine, Ser 1.31 (H) 0.76 - 1.27 mg/dL   GFR calc non Af Amer 58 (L) >59 mL/min/1.73   GFR calc Af Amer 67 >59 mL/min/1.73   BUN/Creatinine Ratio 15 10 - 24   Sodium 143 134 - 144 mmol/L   Potassium 4.4 3.5 - 5.2 mmol/L   Chloride 101 96 - 106 mmol/L   CO2 27 18 - 29 mmol/L   Calcium 10.0 8.6 - 10.2 mg/dL   Total Protein 7.7 6.0 - 8.5 g/dL   Albumin 4.5 3.6 - 4.8 g/dL   Globulin, Total 3.2 1.5 - 4.5 g/dL   Albumin/Globulin Ratio 1.4 1.2 - 2.2   Bilirubin Total 0.3 0.0 - 1.2 mg/dL   Alkaline Phosphatase 69 39 - 117 IU/L   AST 22 0 - 40 IU/L   ALT 15 0 - 44 IU/L  Bayer DCA Hb A1c Waived  Result Value Ref Range   Bayer DCA Hb A1c Waived 6.4 <7.0 %  Lipid Panel w/o Chol/HDL Ratio  Result Value Ref Range   Cholesterol, Total 135 100 - 199 mg/dL   Triglycerides 125 0 - 149 mg/dL   HDL 42 >39 mg/dL   VLDL Cholesterol Cal 25 5 - 40 mg/dL   LDL Calculated 68 0 - 99 mg/dL  CBC with Differential/Platelet  Result Value Ref Range   WBC 5.6 3.4 - 10.8 x10E3/uL   RBC 5.00 4.14 - 5.80 x10E6/uL   Hemoglobin 13.3 13.0 - 17.7 g/dL   Hematocrit 39.7 37.5 - 51.0 %   MCV 79 79 - 97 fL   MCH 26.6 26.6 - 33.0 pg   MCHC 33.5 31.5 - 35.7 g/dL   RDW 14.3 12.3 - 15.4 %   Platelets 333 150 - 379 x10E3/uL   Neutrophils 43 Not Estab. %   Lymphs 43 Not Estab. %   Monocytes 10 Not Estab. %   Eos 4 Not Estab. %   Basos 0 Not Estab. %   Neutrophils Absolute 2.4 1.4 - 7.0 x10E3/uL   Lymphocytes Absolute 2.4 0.7 - 3.1 x10E3/uL   Monocytes Absolute 0.5 0.1 - 0.9 x10E3/uL   EOS (ABSOLUTE) 0.2 0.0 - 0.4 x10E3/uL   Basophils Absolute 0.0 0.0 - 0.2 x10E3/uL   Immature Granulocytes 0 Not Estab. %   Immature Grans (Abs) 0.0 0.0 - 0.1 x10E3/uL      Assessment & Plan:   Problem List  Items Addressed This Visit      Unprioritized   Benign  hypertension with chronic kidney disease    Stable, continue present medications.        Relevant Orders   Comprehensive metabolic panel   CKD (chronic kidney disease), stage III    Check CMP      Diabetes ([place])    This has been stable.  Check Hgb AiC      Relevant Orders   Hgb A1c w/o eAG   Hyperlipidemia    Check Lipid panel      Relevant Orders   Lipid Panel w/o Chol/HDL Ratio    Other Visit Diagnoses    Annual physical exam    -  Primary   Relevant Orders   CBC with Differential/Platelet   PSA   TSH       Follow up plan: Return in about 6 months (around 10/30/2017).

## 2017-05-03 LAB — CBC WITH DIFFERENTIAL/PLATELET
BASOS ABS: 0 10*3/uL (ref 0.0–0.2)
BASOS: 0 %
EOS (ABSOLUTE): 0.4 10*3/uL (ref 0.0–0.4)
Eos: 8 %
HEMATOCRIT: 39.3 % (ref 37.5–51.0)
HEMOGLOBIN: 13 g/dL (ref 13.0–17.7)
IMMATURE GRANS (ABS): 0 10*3/uL (ref 0.0–0.1)
Immature Granulocytes: 0 %
LYMPHS ABS: 2.6 10*3/uL (ref 0.7–3.1)
LYMPHS: 47 %
MCH: 26.5 pg — AB (ref 26.6–33.0)
MCHC: 33.1 g/dL (ref 31.5–35.7)
MCV: 80 fL (ref 79–97)
MONOCYTES: 8 %
Monocytes Absolute: 0.5 10*3/uL (ref 0.1–0.9)
NEUTROS ABS: 2 10*3/uL (ref 1.4–7.0)
Neutrophils: 37 %
Platelets: 307 10*3/uL (ref 150–379)
RBC: 4.9 x10E6/uL (ref 4.14–5.80)
RDW: 14.5 % (ref 12.3–15.4)
WBC: 5.5 10*3/uL (ref 3.4–10.8)

## 2017-05-03 LAB — COMPREHENSIVE METABOLIC PANEL
ALK PHOS: 73 IU/L (ref 39–117)
ALT: 17 IU/L (ref 0–44)
AST: 23 IU/L (ref 0–40)
Albumin/Globulin Ratio: 1.4 (ref 1.2–2.2)
Albumin: 4.2 g/dL (ref 3.6–4.8)
BILIRUBIN TOTAL: 0.4 mg/dL (ref 0.0–1.2)
BUN/Creatinine Ratio: 12 (ref 10–24)
BUN: 16 mg/dL (ref 8–27)
CHLORIDE: 108 mmol/L — AB (ref 96–106)
CO2: 21 mmol/L (ref 20–29)
CREATININE: 1.35 mg/dL — AB (ref 0.76–1.27)
Calcium: 9.3 mg/dL (ref 8.6–10.2)
GFR calc Af Amer: 64 mL/min/{1.73_m2} (ref >59)
GFR calc non Af Amer: 55 mL/min/{1.73_m2} — ABNORMAL LOW (ref >59)
GLUCOSE: 91 mg/dL (ref 65–99)
Globulin, Total: 2.9 g/dL (ref 1.5–4.5)
Potassium: 4.5 mmol/L (ref 3.5–5.2)
SODIUM: 146 mmol/L — AB (ref 134–144)
Total Protein: 7.1 g/dL (ref 6.0–8.5)

## 2017-05-03 LAB — LIPID PANEL W/O CHOL/HDL RATIO
Cholesterol, Total: 121 mg/dL (ref 100–199)
HDL: 40 mg/dL (ref >39)
LDL CALC: 36 mg/dL (ref 0–99)
TRIGLYCERIDES: 226 mg/dL — AB (ref 0–149)
VLDL Cholesterol Cal: 45 mg/dL — ABNORMAL HIGH (ref 5–40)

## 2017-05-03 LAB — HGB A1C W/O EAG: Hgb A1c MFr Bld: 5.9 % — ABNORMAL HIGH (ref 4.8–5.6)

## 2017-05-03 LAB — TSH: TSH: 0.57 u[IU]/mL (ref 0.450–4.500)

## 2017-05-03 LAB — PSA: PROSTATE SPECIFIC AG, SERUM: 4.1 ng/mL — AB (ref 0.0–4.0)

## 2017-05-05 ENCOUNTER — Other Ambulatory Visit: Payer: Self-pay | Admitting: Unknown Physician Specialty

## 2017-05-05 DIAGNOSIS — R972 Elevated prostate specific antigen [PSA]: Secondary | ICD-10-CM

## 2017-06-13 ENCOUNTER — Encounter: Payer: Self-pay | Admitting: Urology

## 2017-06-13 ENCOUNTER — Ambulatory Visit (INDEPENDENT_AMBULATORY_CARE_PROVIDER_SITE_OTHER): Payer: BLUE CROSS/BLUE SHIELD | Admitting: Urology

## 2017-06-13 VITALS — BP 143/83 | HR 93 | Ht 70.0 in | Wt 210.0 lb

## 2017-06-13 DIAGNOSIS — N401 Enlarged prostate with lower urinary tract symptoms: Secondary | ICD-10-CM | POA: Diagnosis not present

## 2017-06-13 DIAGNOSIS — N138 Other obstructive and reflux uropathy: Secondary | ICD-10-CM | POA: Diagnosis not present

## 2017-06-13 DIAGNOSIS — R972 Elevated prostate specific antigen [PSA]: Secondary | ICD-10-CM | POA: Diagnosis not present

## 2017-06-13 MED ORDER — TAMSULOSIN HCL 0.4 MG PO CAPS: 0.4000 mg | ORAL_CAPSULE | Freq: Every day | ORAL | 11 refills | Status: DC

## 2017-06-13 NOTE — Progress Notes (Signed)
06/13/2017 10:15 AM   [name] [birth year] [id number]  Referring provider: Kathrine Haddock, NP [address], [place] [address]  Chief Complaint  Patient presents with  . Elevated PSA    New Patient    HPI: 61 year old  AA male referred for further evaluation of elevated PSA to 4.1 drawn on 05/02/2017. This is up significantly from 1 year ago which time his PSA was 3.1.  He denies ever seeing a urologist in the past. No previous history of elevated PSA. No history of abnormal rectal exam.  He does get up 2-3x night to void.  His stream is weak and does not feel like he is able to empty completely.  He denies intermittency.  No frequency or urgency.   No gross hematuria.  He did have a UTI x 1 about 2 years ago.    He's never tried any medications for BPH.   No family history prostate cancer.    PMH: Past Medical History:  Diagnosis Date  . Benign hypertension with chronic kidney disease 04/24/2015  . Chronic kidney disease   . CKD (chronic kidney disease), stage III ([place]) 04/24/2015  . Diabetes ([place]) 04/24/2015  . Diabetes mellitus without complication ([place])   . Gout 04/24/2015  . Hyperlipidemia   . Hypertension     Surgical History: Past Surgical History:  Procedure Laterality Date  . partial thumb amputation      Home Medications:  Allergies as of 06/13/2017   No Known Allergies     Medication List       Accurate as of 06/13/17 10:15 AM. Always use your most recent med list.          amLODipine 10 MG tablet Commonly known as:  NORVASC Take 1 tablet (10 mg total) by mouth daily.   aspirin 81 MG tablet Take 81 mg by mouth daily.   Fish Oil 1000 MG Caps Take 1,000 mg by mouth daily.   lisinopril-hydrochlorothiazide 20-12.5 MG tablet Commonly known as:  PRINZIDE,ZESTORETIC Take 1 tablet by mouth daily.   metFORMIN 500 MG tablet Commonly known as:  GLUCOPHAGE Take 1 tablet (500 mg total) by mouth 2 (two) times daily with a meal.   multivitamin  tablet Take 1 tablet by mouth daily.   rosuvastatin 20 MG tablet Commonly known as:  CRESTOR Take 1 tablet (20 mg total) by mouth daily.   tamsulosin 0.4 MG Caps capsule Commonly known as:  FLOMAX Take 1 capsule (0.4 mg total) by mouth daily.   Vitamin D-3 1000 units Caps Take 1,000 Units by mouth daily.       Allergies: No Known Allergies  Family History: Family History  Problem Relation Age of Onset  . Cancer Mother        lung  . Hypertension Mother   . Diabetes Father   . Hypertension Father   . Kidney disease Father   . Kidney disease Brother   . Hypertension Sister   . Hypertension Sister   . Hypertension Sister     Social History:  reports that he quit smoking about 12 years ago. He has never used smokeless tobacco. He reports that he drinks about 3.6 oz of alcohol per week . He reports that he does not use drugs.  ROS: UROLOGY Frequent Urination?: No Hard to postpone urination?: No Burning/pain with urination?: No Get up at night to urinate?: Yes Leakage of urine?: No Urine stream starts and stops?: No Trouble starting stream?: No Do you have to strain to  urinate?: No Blood in urine?: No Urinary tract infection?: No Sexually transmitted disease?: No Injury to kidneys or bladder?: No Painful intercourse?: No Weak stream?: Yes Erection problems?: No Penile pain?: No  Gastrointestinal Nausea?: No Vomiting?: No Indigestion/heartburn?: No Diarrhea?: No Constipation?: No  Constitutional Fever: No Night sweats?: No Weight loss?: No Fatigue?: No  Skin Skin rash/lesions?: No Itching?: No  Eyes Blurred vision?: No Double vision?: No  Ears/Nose/Throat Sore throat?: No Sinus problems?: No  Hematologic/Lymphatic Swollen glands?: No Easy bruising?: No  Cardiovascular Leg swelling?: No Chest pain?: No  Respiratory Cough?: No Shortness of breath?: No  Endocrine Excessive thirst?: No  Musculoskeletal Back pain?: No Joint pain?:  No  Neurological Headaches?: No Dizziness?: No  Psychologic Depression?: No Anxiety?: No  Physical Exam: BP (!) 143/83   Pulse 93   Ht 5\' 10"  (1.778 m)   Wt 210 lb (95.3 kg)   BMI 30.13 kg/m   Constitutional:  Alert and oriented, No acute distress. HEENT: [place] AT, moist mucus membranes.  Trachea midline, no masses. Cardiovascular: No clubbing, cyanosis, or edema. Respiratory: Normal respiratory effort, no increased work of breathing. GI: Abdomen is soft, nontender, nondistended, no abdominal masses GU: No CVA tenderness.  Rectal: Normal tone. 50 cc prostate, rubbery lateral lobes without nodules. Skin: No rashes, bruises or suspicious lesions. Neurologic: Grossly intact, no focal deficits, moving all 4 extremities. Psychiatric: Normal mood and affect.  Laboratory Data: Lab Results  Component Value Date   WBC 5.5 05/02/2017   HGB 13.0 05/02/2017   HCT 39.3 05/02/2017   MCV 80 05/02/2017   PLT 307 05/02/2017    Lab Results  Component Value Date   CREATININE 1.35 (H) 05/02/2017    Lab Results  Component Value Date   PSA1 4.1 (H) 05/02/2017   PSA1 3.1 04/23/2016    Lab Results  Component Value Date   HGBA1C 5.9 (H) 05/02/2017    Urinalysis N/a  Pertinent Imaging: n/a  Assessment & Plan:   1. Elevated PSA  We reviewed the implications of an elevated PSA and the uncertainty surrounding it. In general, a man's PSA increases with age and is produced by both normal and cancerous prostate tissue. Differential for elevated PSA is BPH, prostate cancer, infection, recent intercourse/ejaculation, prostate infarction, recent urethroscopic manipulation (foley placement/cystoscopy) and prostatitis. Management of an elevated PSA can include observation or prostate biopsy and wediscussed this in detail.  We discussed that indications for prostate biopsy are defined by age and race specific PSA cutoffs as well as a PSA velocity of 0.75/year.  At this point in time, like  to repeat his PSA to ensure that this is real. If his PSA is back down to baseline, we'll continue to follow annually. If his PSA is stable, consider recheck in 6 months versus proceeding with biopsy. If more elevated, which strongly consider biopsy.  We discussed prostate biopsy in detail including the procedure itself, the risks of blood in the urine, stool, and ejaculate, serious infection, and discomfort. He is willing to proceed with this as discussed.  We will call him with his PSA results and plan based on repeat value.  - PSA  2. BPH with obstruction/lower urinary tract symptoms Discuss trial of Flomax for obstructive voiding symptoms Side effects including orthostatic hypotension and retrograde ejaculation reviewed reassess in 6 months   Return for PSA/ IPSS/ PVR.  Hollice Espy, MD  [facility] [address], [place], [place] [address] [phone]

## 2017-06-14 LAB — PSA: Prostate Specific Ag, Serum: 3.6 ng/mL (ref 0.0–4.0)

## 2017-06-16 ENCOUNTER — Telehealth: Payer: Self-pay

## 2017-06-16 NOTE — Telephone Encounter (Signed)
Called pt. No answer. LTA says she will have pt retrun call.

## 2017-06-16 NOTE — Telephone Encounter (Signed)
Gave patient his PSA results and answered all of his questions.  Sharyn Lull

## 2017-06-16 NOTE — Telephone Encounter (Signed)
-----   Message from Hollice Espy, MD sent at 06/15/2017  9:35 AM EDT ----- Repeat PSA now 3.6, trending back down towards your baseline.  I recommend following up as discussed in 6 months for repeat PSA and to discuss your urinary symptoms.  Hollice Espy, MD

## 2017-06-17 NOTE — Telephone Encounter (Signed)
This encounter was created in error - please disregard.

## 2017-07-14 ENCOUNTER — Other Ambulatory Visit: Payer: Self-pay | Admitting: Unknown Physician Specialty

## 2017-09-05 LAB — HM DIABETES EYE EXAM

## 2017-10-14 ENCOUNTER — Other Ambulatory Visit: Payer: Self-pay | Admitting: Unknown Physician Specialty

## 2017-10-31 ENCOUNTER — Ambulatory Visit: Payer: BLUE CROSS/BLUE SHIELD | Admitting: Unknown Physician Specialty

## 2017-11-14 ENCOUNTER — Encounter: Payer: Self-pay | Admitting: Unknown Physician Specialty

## 2017-11-14 ENCOUNTER — Ambulatory Visit (INDEPENDENT_AMBULATORY_CARE_PROVIDER_SITE_OTHER): Payer: Managed Care, Other (non HMO) | Admitting: Unknown Physician Specialty

## 2017-11-14 DIAGNOSIS — E119 Type 2 diabetes mellitus without complications: Secondary | ICD-10-CM

## 2017-11-14 DIAGNOSIS — E785 Hyperlipidemia, unspecified: Secondary | ICD-10-CM | POA: Diagnosis not present

## 2017-11-14 DIAGNOSIS — E1121 Type 2 diabetes mellitus with diabetic nephropathy: Secondary | ICD-10-CM | POA: Insufficient documentation

## 2017-11-14 DIAGNOSIS — I129 Hypertensive chronic kidney disease with stage 1 through stage 4 chronic kidney disease, or unspecified chronic kidney disease: Secondary | ICD-10-CM

## 2017-11-14 LAB — BAYER DCA HB A1C WAIVED: HB A1C (BAYER DCA - WAIVED): 5.8 % (ref <7.0)

## 2017-11-14 NOTE — Assessment & Plan Note (Signed)
Stable, continue present medications.   

## 2017-11-14 NOTE — Assessment & Plan Note (Signed)
Hgb A1C is 5.8.  Continue Metformin

## 2017-11-14 NOTE — Progress Notes (Signed)
BP 136/86 (BP Location: Left Arm, Cuff Size: Large)   Pulse 83   Temp 99.1 F (37.3 C) (Oral)   Ht 5\' 10"  (1.778 m)   Wt 219 lb 4.8 oz (99.5 kg)   SpO2 97%   BMI 31.47 kg/m    Subjective:    Patient ID: Fernando Frederick, male    DOB: Nov 06, 1955, 62 y.o.   MRN: [id number]  HPI: Fernando Frederick is a 62 y.o. male  Chief Complaint  Patient presents with  . Diabetes  . Hypertension  . Hyperlipidemia   Diabetes: Using medications without difficulties No hypoglycemic episodes No hyperglycemic episodes Feet problems: none Blood Sugars averaging: 147 last week eye exam within last year Last Hgb A1C: 5/9  Hypertension  Using medications without difficulty Average home BPs Not checking  Using medication without problems or lightheadedness No chest pain with exertion or shortness of breath No Edema  Elevated Cholesterol Using medications without problems No Muscle aches  Diet: Exercise: not doing well lately   Relevant past medical, surgical, family and social history reviewed and updated as indicated. Interim medical history since our last visit reviewed. Allergies and medications reviewed and updated.  Review of Systems  Per HPI unless specifically indicated above     Objective:    BP 136/86 (BP Location: Left Arm, Cuff Size: Large)   Pulse 83   Temp 99.1 F (37.3 C) (Oral)   Ht 5\' 10"  (1.778 m)   Wt 219 lb 4.8 oz (99.5 kg)   SpO2 97%   BMI 31.47 kg/m   Wt Readings from Last 3 Encounters:  11/14/17 219 lb 4.8 oz (99.5 kg)  06/13/17 210 lb (95.3 kg)  05/02/17 208 lb 12.8 oz (94.7 kg)    Physical Exam  Constitutional: He is oriented to person, place, and time. He appears well-developed and well-nourished. No distress.  HENT:  Head: Normocephalic and atraumatic.  Eyes: Conjunctivae and lids are normal. Right eye exhibits no discharge. Left eye exhibits no discharge. No scleral icterus.  Neck: Normal range of motion. Neck supple. No JVD present. Carotid  bruit is not present.  Cardiovascular: Normal rate, regular rhythm and normal heart sounds.  Pulmonary/Chest: Effort normal and breath sounds normal. No respiratory distress.  Abdominal: Normal appearance. There is no splenomegaly or hepatomegaly.  Musculoskeletal: Normal range of motion.  Neurological: He is alert and oriented to person, place, and time.  Skin: Skin is warm, dry and intact. No rash noted. No pallor.  Psychiatric: He has a normal mood and affect. His behavior is normal. Judgment and thought content normal.    Results for orders placed or performed in visit on 09/22/17  HM DIABETES EYE EXAM  Result Value Ref Range   HM Diabetic Eye Exam No Retinopathy No Retinopathy      Assessment & Plan:   Problem List Items Addressed This Visit      Unprioritized   Benign hypertension with chronic kidney disease    Not at goal of 130/80.  Discussed DASH diet      Hyperlipidemia    Stable, continue present medications.        Type 2 diabetes mellitus without complication, without long-term current use of insulin ([facility])    Hgb A1C is 5.8.  Continue Metformin      Relevant Orders   Comprehensive metabolic panel   Bayer DCA Hb A1c Waived       Follow up plan: Return in about 6 months (around 05/17/2018) for physical.

## 2017-11-14 NOTE — Patient Instructions (Signed)
DASH Eating Plan DASH stands for "Dietary Approaches to Stop Hypertension." The DASH eating plan is a healthy eating plan that has been shown to reduce high blood pressure (hypertension). It may also reduce your risk for type 2 diabetes, heart disease, and stroke. The DASH eating plan may also help with weight loss. What are tips for following this plan? General guidelines  Avoid eating more than 2,300 mg (milligrams) of salt (sodium) a day. If you have hypertension, you may need to reduce your sodium intake to 1,500 mg a day.  Limit alcohol intake to no more than 1 drink a day for nonpregnant women and 2 drinks a day for men. One drink equals 12 oz of beer, 5 oz of wine, or 1 oz of hard liquor.  Work with your health care provider to maintain a healthy body weight or to lose weight. Ask what an ideal weight is for you.  Get at least 30 minutes of exercise that causes your heart to beat faster (aerobic exercise) most days of the week. Activities may include walking, swimming, or biking.  Work with your health care provider or diet and nutrition specialist (dietitian) to adjust your eating plan to your individual calorie needs. Reading food labels  Check food labels for the amount of sodium per serving. Choose foods with less than 5 percent of the Daily Value of sodium. Generally, foods with less than 300 mg of sodium per serving fit into this eating plan.  To find whole grains, look for the word "whole" as the first word in the ingredient list. Shopping  Buy products labeled as "low-sodium" or "no salt added."  Buy fresh foods. Avoid canned foods and premade or frozen meals. Cooking  Avoid adding salt when cooking. Use salt-free seasonings or herbs instead of table salt or sea salt. Check with your health care provider or pharmacist before using salt substitutes.  Do not fry foods. Cook foods using healthy methods such as baking, boiling, grilling, and broiling instead.  Cook with  heart-healthy oils, such as olive, canola, soybean, or sunflower oil. Meal planning   Eat a balanced diet that includes: ? 5 or more servings of fruits and vegetables each day. At each meal, try to fill half of your plate with fruits and vegetables. ? Up to 6-8 servings of whole grains each day. ? Less than 6 oz of lean meat, poultry, or fish each day. A 3-oz serving of meat is about the same size as a deck of cards. One egg equals 1 oz. ? 2 servings of low-fat dairy each day. ? A serving of nuts, seeds, or beans 5 times each week. ? Heart-healthy fats. Healthy fats called Omega-3 fatty acids are found in foods such as flaxseeds and coldwater fish, like sardines, salmon, and mackerel.  Limit how much you eat of the following: ? Canned or prepackaged foods. ? Food that is high in trans fat, such as fried foods. ? Food that is high in saturated fat, such as fatty meat. ? Sweets, desserts, sugary drinks, and other foods with added sugar. ? Full-fat dairy products.  Do not salt foods before eating.  Try to eat at least 2 vegetarian meals each week.  Eat more home-cooked food and less restaurant, buffet, and fast food.  When eating at a restaurant, ask that your food be prepared with less salt or no salt, if possible. What foods are recommended? The items listed may not be a complete list. Talk with your dietitian about what   dietary choices are best for you. Grains Whole-grain or whole-wheat bread. Whole-grain or whole-wheat pasta. Brown rice. Oatmeal. Quinoa. Bulgur. Whole-grain and low-sodium cereals. Pita bread. Low-fat, low-sodium crackers. Whole-wheat flour tortillas. Vegetables Fresh or frozen vegetables (raw, steamed, roasted, or grilled). Low-sodium or reduced-sodium tomato and vegetable juice. Low-sodium or reduced-sodium tomato sauce and tomato paste. Low-sodium or reduced-sodium canned vegetables. Fruits All fresh, dried, or frozen fruit. Canned fruit in natural juice (without  added sugar). Meat and other protein foods Skinless chicken or turkey. Ground chicken or turkey. Pork with fat trimmed off. Fish and seafood. Egg whites. Dried beans, peas, or lentils. Unsalted nuts, nut butters, and seeds. Unsalted canned beans. Lean cuts of beef with fat trimmed off. Low-sodium, lean deli meat. Dairy Low-fat (1%) or fat-free (skim) milk. Fat-free, low-fat, or reduced-fat cheeses. Nonfat, low-sodium ricotta or cottage cheese. Low-fat or nonfat yogurt. Low-fat, low-sodium cheese. Fats and oils Soft margarine without trans fats. Vegetable oil. Low-fat, reduced-fat, or light mayonnaise and salad dressings (reduced-sodium). Canola, safflower, olive, soybean, and sunflower oils. Avocado. Seasoning and other foods Herbs. Spices. Seasoning mixes without salt. Unsalted popcorn and pretzels. Fat-free sweets. What foods are not recommended? The items listed may not be a complete list. Talk with your dietitian about what dietary choices are best for you. Grains Baked goods made with fat, such as croissants, muffins, or some breads. Dry pasta or rice meal packs. Vegetables Creamed or fried vegetables. Vegetables in a cheese sauce. Regular canned vegetables (not low-sodium or reduced-sodium). Regular canned tomato sauce and paste (not low-sodium or reduced-sodium). Regular tomato and vegetable juice (not low-sodium or reduced-sodium). Pickles. Olives. Fruits Canned fruit in a light or heavy syrup. Fried fruit. Fruit in cream or butter sauce. Meat and other protein foods Fatty cuts of meat. Ribs. Fried meat. Bacon. Sausage. Bologna and other processed lunch meats. Salami. Fatback. Hotdogs. Bratwurst. Salted nuts and seeds. Canned beans with added salt. Canned or smoked fish. Whole eggs or egg yolks. Chicken or turkey with skin. Dairy Whole or 2% milk, cream, and half-and-half. Whole or full-fat cream cheese. Whole-fat or sweetened yogurt. Full-fat cheese. Nondairy creamers. Whipped toppings.  Processed cheese and cheese spreads. Fats and oils Butter. Stick margarine. Lard. Shortening. Ghee. Bacon fat. Tropical oils, such as coconut, palm kernel, or palm oil. Seasoning and other foods Salted popcorn and pretzels. Onion salt, garlic salt, seasoned salt, table salt, and sea salt. Worcestershire sauce. Tartar sauce. Barbecue sauce. Teriyaki sauce. Soy sauce, including reduced-sodium. Steak sauce. Canned and packaged gravies. Fish sauce. Oyster sauce. Cocktail sauce. Horseradish that you find on the shelf. Ketchup. Mustard. Meat flavorings and tenderizers. Bouillon cubes. Hot sauce and Tabasco sauce. Premade or packaged marinades. Premade or packaged taco seasonings. Relishes. Regular salad dressings. Where to find more information:  National Heart, Lung, and Blood Institute: www.nhlbi.nih.gov  American Heart Association: www.heart.org Summary  The DASH eating plan is a healthy eating plan that has been shown to reduce high blood pressure (hypertension). It may also reduce your risk for type 2 diabetes, heart disease, and stroke.  With the DASH eating plan, you should limit salt (sodium) intake to 2,300 mg a day. If you have hypertension, you may need to reduce your sodium intake to 1,500 mg a day.  When on the DASH eating plan, aim to eat more fresh fruits and vegetables, whole grains, lean proteins, low-fat dairy, and heart-healthy fats.  Work with your health care provider or diet and nutrition specialist (dietitian) to adjust your eating plan to your individual   calorie needs. This information is not intended to replace advice given to you by your health care provider. Make sure you discuss any questions you have with your health care provider. Document Released: 08/01/2011 Document Revised: 08/05/2016 Document Reviewed: 08/05/2016 Elsevier Interactive Patient Education  2018 Elsevier Inc.  

## 2017-11-14 NOTE — Assessment & Plan Note (Signed)
Not at goal of 130/80.  Discussed DASH diet

## 2017-11-15 LAB — COMPREHENSIVE METABOLIC PANEL
A/G RATIO: 1.6 (ref 1.2–2.2)
ALT: 22 IU/L (ref 0–44)
AST: 32 IU/L (ref 0–40)
Albumin: 4.5 g/dL (ref 3.6–4.8)
Alkaline Phosphatase: 71 IU/L (ref 39–117)
BILIRUBIN TOTAL: 0.3 mg/dL (ref 0.0–1.2)
BUN/Creatinine Ratio: 13 (ref 10–24)
BUN: 17 mg/dL (ref 8–27)
CHLORIDE: 105 mmol/L (ref 96–106)
CO2: 23 mmol/L (ref 20–29)
Calcium: 9.7 mg/dL (ref 8.6–10.2)
Creatinine, Ser: 1.31 mg/dL — ABNORMAL HIGH (ref 0.76–1.27)
GFR calc Af Amer: 67 mL/min/{1.73_m2} (ref >59)
GFR calc non Af Amer: 58 mL/min/{1.73_m2} — ABNORMAL LOW (ref >59)
Globulin, Total: 2.9 g/dL (ref 1.5–4.5)
Glucose: 98 mg/dL (ref 65–99)
POTASSIUM: 4.5 mmol/L (ref 3.5–5.2)
SODIUM: 144 mmol/L (ref 134–144)
Total Protein: 7.4 g/dL (ref 6.0–8.5)

## 2017-11-18 ENCOUNTER — Encounter: Payer: Self-pay | Admitting: Family Medicine

## 2017-11-26 ENCOUNTER — Other Ambulatory Visit: Payer: Self-pay | Admitting: Unknown Physician Specialty

## 2017-11-26 MED ORDER — LISINOPRIL-HYDROCHLOROTHIAZIDE 20-12.5 MG PO TABS: 1.0000 | ORAL_TABLET | Freq: Every day | ORAL | 1 refills | Status: DC

## 2017-11-26 NOTE — Telephone Encounter (Signed)
Patient is completely out of his lisinopril. Could you send refill ASAP to [facility].  Thanks

## 2017-11-26 NOTE — Telephone Encounter (Signed)
[facility] in [place]. Rx was filled and pt has picked med up.

## 2017-12-11 ENCOUNTER — Other Ambulatory Visit: Payer: Managed Care, Other (non HMO)

## 2017-12-11 ENCOUNTER — Other Ambulatory Visit: Payer: Self-pay

## 2017-12-11 DIAGNOSIS — R972 Elevated prostate specific antigen [PSA]: Secondary | ICD-10-CM

## 2017-12-12 ENCOUNTER — Other Ambulatory Visit: Payer: BLUE CROSS/BLUE SHIELD

## 2017-12-12 LAB — PSA: Prostate Specific Ag, Serum: 4.5 ng/mL — ABNORMAL HIGH (ref 0.0–4.0)

## 2017-12-15 ENCOUNTER — Telehealth: Payer: Self-pay

## 2017-12-15 NOTE — Telephone Encounter (Signed)
-----   Message from Hollice Espy, MD sent at 12/15/2017  1:17 PM EDT ----- PSA up to 4.5.  Options at this point include repeat PSA with visit in 6 months to assess for rising trend or going ahead and pursuing biopsy.  If he does elect to follow-up in 6 months, I would strongly emphasize the importance of timely follow-up.  Hollice Espy, MD

## 2017-12-15 NOTE — Telephone Encounter (Signed)
Left pt mess to call 

## 2017-12-16 ENCOUNTER — Encounter: Payer: Self-pay | Admitting: Urology

## 2017-12-16 ENCOUNTER — Ambulatory Visit (INDEPENDENT_AMBULATORY_CARE_PROVIDER_SITE_OTHER): Payer: Managed Care, Other (non HMO) | Admitting: Urology

## 2017-12-16 VITALS — BP 137/83 | HR 89 | Resp 16 | Ht 71.0 in | Wt 223.8 lb

## 2017-12-16 DIAGNOSIS — R972 Elevated prostate specific antigen [PSA]: Secondary | ICD-10-CM | POA: Diagnosis not present

## 2017-12-16 DIAGNOSIS — N138 Other obstructive and reflux uropathy: Secondary | ICD-10-CM | POA: Diagnosis not present

## 2017-12-16 DIAGNOSIS — N401 Enlarged prostate with lower urinary tract symptoms: Secondary | ICD-10-CM | POA: Diagnosis not present

## 2017-12-16 LAB — BLADDER SCAN AMB NON-IMAGING

## 2017-12-16 MED ORDER — FINASTERIDE 5 MG PO TABS: 5.0000 mg | ORAL_TABLET | Freq: Every day | ORAL | 3 refills | Status: DC

## 2017-12-16 NOTE — Progress Notes (Signed)
12/16/2017 4:10 PM   Fernando Frederick 11-03-1955 [id number]  Referring provider: Kathrine Haddock, NP [address], [place] [address]  Chief Complaint  Patient presents with  . Elevated PSA    HPI: 62 year old male with history of fluctuating PSA/ BPH returns today for six-month follow-up.  He has history of fluctuating PSA.  PSA trend as below.  His most recent PSA is up to 4.5.  Rectal exam on 05/2017 enlarged but otherwise unremarkable.  He also has a CT scan from 2014 indicating mild prostamegaly.  This is a personal history of symptoms including nocturia x3, urinary hesitancy and weak stream.  Last visit, he was given Flomax about 3 days.  He reports that the medication made him feel "funny" alluding to.  He is to stop this medication.  He did not notice much improved after only 3 days of medication.  IPSS below.  PVR.   IPSS    Row Name 12/16/17 1500         International Prostate Symptom Score   How often have you had the sensation of not emptying your bladder?  Less than 1 in 5     How often have you had to urinate less than every two hours?  Less than 1 in 5 times     How often have you found you stopped and started again several times when you urinated?  Less than 1 in 5 times     How often have you found it difficult to postpone urination?  Less than 1 in 5 times     How often have you had a weak urinary stream?  About half the time     How often have you had to strain to start urination?  Less than 1 in 5 times     How many times did you typically get up at night to urinate?  3 Times     Total IPSS Score  11       Quality of Life due to urinary symptoms   If you were to spend the rest of your life with your urinary condition just the way it is now how would you feel about that?  Mixed        Score:  1-7 Mild 8-19 Moderate 20-35 Severe  PMH: Past Medical History:  Diagnosis Date  . Benign hypertension with chronic kidney disease 04/24/2015  . Chronic kidney  disease   . CKD (chronic kidney disease), stage III ([place]) 04/24/2015  . Diabetes ([place]) 04/24/2015  . Diabetes mellitus without complication ([place])   . Gout 04/24/2015  . Hyperlipidemia   . Hypertension     Surgical History: Past Surgical History:  Procedure Laterality Date  . partial thumb amputation      Home Medications:  Allergies as of 12/16/2017   No Known Allergies     Medication List        Accurate as of 12/16/17  4:10 PM. Always use your most recent med list.          amLODipine 10 MG tablet Commonly known as:  NORVASC Take 1 tablet (10 mg total) by mouth daily.   aspirin 81 MG tablet Take 81 mg by mouth daily.   finasteride 5 MG tablet Commonly known as:  PROSCAR Take 1 tablet (5 mg total) by mouth daily.   Fish Oil 1000 MG Caps Take 1,000 mg by mouth daily.   lisinopril-hydrochlorothiazide 20-12.5 MG tablet Commonly known as:  PRINZIDE,ZESTORETIC Take 1 tablet by mouth  daily.   lisinopril-hydrochlorothiazide 20-12.5 MG tablet Commonly known as:  PRINZIDE,ZESTORETIC Take 1 tablet by mouth daily.   metFORMIN 500 MG tablet Commonly known as:  GLUCOPHAGE Take 1 tablet (500 mg total) by mouth 2 (two) times daily with a meal.   multivitamin tablet Take 1 tablet by mouth daily.   rosuvastatin 20 MG tablet Commonly known as:  CRESTOR Take 1 tablet (20 mg total) by mouth daily.   Vitamin D-3 1000 units Caps Take 1,000 Units by mouth daily.       Allergies: No Known Allergies  Family History: Family History  Problem Relation Age of Onset  . Cancer Mother        lung  . Hypertension Mother   . Diabetes Father   . Hypertension Father   . Kidney disease Father   . Kidney disease Brother   . Hypertension Sister   . Hypertension Sister   . Hypertension Sister     Social History:  reports that he quit smoking about 13 years ago. He has never used smokeless tobacco. He reports that he drinks about 3.6 oz of alcohol per week. He reports that he  does not use drugs.  ROS: UROLOGY Frequent Urination?: Yes Hard to postpone urination?: No Burning/pain with urination?: No Get up at night to urinate?: Yes Leakage of urine?: No Urine stream starts and stops?: No Trouble starting stream?: No Do you have to strain to urinate?: No Blood in urine?: No Urinary tract infection?: No Sexually transmitted disease?: No Injury to kidneys or bladder?: No Painful intercourse?: No Weak stream?: No Erection problems?: Yes Penile pain?: No  Gastrointestinal Nausea?: No Vomiting?: No Indigestion/heartburn?: No Diarrhea?: No Constipation?: No  Constitutional Fever: No Night sweats?: No Weight loss?: No Fatigue?: No  Skin Skin rash/lesions?: No Itching?: No  Eyes Blurred vision?: No Double vision?: No  Ears/Nose/Throat Sore throat?: No Sinus problems?: No  Hematologic/Lymphatic Swollen glands?: No Easy bruising?: No  Cardiovascular Leg swelling?: No Chest pain?: No  Respiratory Cough?: No Shortness of breath?: No  Endocrine Excessive thirst?: No  Musculoskeletal Back pain?: No Joint pain?: No  Neurological Headaches?: No Dizziness?: No  Psychologic Depression?: No Anxiety?: No  Physical Exam: BP 137/83   Pulse 89   Resp 16   Ht 5\' 11"  (1.803 m)   Wt 223 lb 12.8 oz (101.5 kg)   SpO2 98%   BMI 31.21 kg/m   Constitutional:  Alert and oriented, No acute distress. HEENT: [place] AT, moist mucus membranes.  Trachea midline, no masses. Cardiovascular: No clubbing, cyanosis, or edema. Respiratory: Normal respiratory effort, no increased work of breathing. Skin: No rashes, bruises or suspicious lesions. Neurologic: Grossly intact, no focal deficits, moving all 4 extremities. Psychiatric: Normal mood and affect.  Laboratory Data: Lab Results  Component Value Date   WBC 5.5 05/02/2017   HGB 13.0 05/02/2017   HCT 39.3 05/02/2017   MCV 80 05/02/2017   PLT 307 05/02/2017    Lab Results  Component Value  Date   CREATININE 1.31 (H) 11/14/2017     Lab Results  Component Value Date   HGBA1C 5.9 (H) 05/02/2017   Component     Latest Ref Rng & Units 04/23/2016 05/02/2017 06/13/2017 12/11/2017  Prostate Specific Ag, Serum     0.0 - 4.0 ng/mL 3.1 4.1 (H) 3.6 4.5 (H)    Urinalysis N/a  Pertinent Imaging: Results for orders placed or performed in visit on 12/16/17  BLADDER SCAN AMB NON-IMAGING  Result Value Ref Range  Scan Result 55ml    Assessment & Plan:    1. Benign prostatic hyperplasia with urinary obstruction Persistent obstructive voiding symptoms which are bothersome Did not tolerate Flomax We discussed alternative of finasteride including discussion of side effects-he is agreeable and will start this medication, he understands that he has to take his medication for a long period of time before he will see any benefit in his voiding We also discuss surgery as an option especially in the setting of prostamegaly and voiding symptoms-it is not interested in a surgical intervention  - BLADDER SCAN AMB NON-IMAGING  2. Elevated PSA History of fluctuating PSA although overall trend is upwards  We discussed options including going ahead and pursuing biopsy versus when additional data point in 6 months prior to pursuing this next line he is most interested in weaning an additional 6 months and [place] if his PSA continues to rise, he will undergo prostate biopsy   We will also start him on finasteride and expect a reduction in his PSA, otherwise   Return in about 6 months (around 06/17/2018) for PSA, IPSS, PVR.  Hollice Espy, MD  [facility] [address], [place], [place] [address] [phone]

## 2018-01-26 ENCOUNTER — Other Ambulatory Visit: Payer: Self-pay | Admitting: Unknown Physician Specialty

## 2018-01-27 NOTE — Telephone Encounter (Signed)
Patient is out of his metformin and needs it sent this afternoon ASAP.    [employer]

## 2018-03-30 ENCOUNTER — Other Ambulatory Visit: Payer: Self-pay

## 2018-03-30 MED ORDER — AMLODIPINE BESYLATE 10 MG PO TABS: 10.0000 mg | ORAL_TABLET | Freq: Every day | ORAL | 0 refills | Status: DC

## 2018-03-30 NOTE — Telephone Encounter (Signed)
Patient last seen 11/14/17 and has appointment 05/22/18.

## 2018-04-04 ENCOUNTER — Other Ambulatory Visit: Payer: Self-pay | Admitting: Unknown Physician Specialty

## 2018-04-29 ENCOUNTER — Other Ambulatory Visit: Payer: Self-pay

## 2018-04-29 MED ORDER — ROSUVASTATIN CALCIUM 20 MG PO TABS: 20.0000 mg | ORAL_TABLET | Freq: Every day | ORAL | 0 refills | Status: DC

## 2018-04-29 NOTE — Telephone Encounter (Signed)
Patient last seen 11/14/17 and has appointment scheduled for 05/22/18.

## 2018-05-22 ENCOUNTER — Encounter: Payer: Managed Care, Other (non HMO) | Admitting: Unknown Physician Specialty

## 2018-05-22 ENCOUNTER — Other Ambulatory Visit: Payer: Self-pay | Admitting: Physician Assistant

## 2018-05-25 ENCOUNTER — Other Ambulatory Visit: Payer: Self-pay

## 2018-05-25 MED ORDER — LISINOPRIL-HYDROCHLOROTHIAZIDE 20-12.5 MG PO TABS: 1.0000 | ORAL_TABLET | Freq: Every day | ORAL | 1 refills | Status: DC

## 2018-05-25 NOTE — Telephone Encounter (Signed)
Patient last seen 11/14/17 and has appointment 06/05/18.

## 2018-06-05 ENCOUNTER — Ambulatory Visit (INDEPENDENT_AMBULATORY_CARE_PROVIDER_SITE_OTHER): Payer: Managed Care, Other (non HMO) | Admitting: Nurse Practitioner

## 2018-06-05 ENCOUNTER — Encounter: Payer: Self-pay | Admitting: Nurse Practitioner

## 2018-06-05 VITALS — BP 119/73 | HR 75 | Temp 97.9°F | Ht 69.0 in | Wt 222.4 lb

## 2018-06-05 DIAGNOSIS — N183 Chronic kidney disease, stage 3 unspecified: Secondary | ICD-10-CM

## 2018-06-05 DIAGNOSIS — Z0000 Encounter for general adult medical examination without abnormal findings: Secondary | ICD-10-CM | POA: Diagnosis not present

## 2018-06-05 DIAGNOSIS — I129 Hypertensive chronic kidney disease with stage 1 through stage 4 chronic kidney disease, or unspecified chronic kidney disease: Secondary | ICD-10-CM

## 2018-06-05 DIAGNOSIS — E119 Type 2 diabetes mellitus without complications: Secondary | ICD-10-CM | POA: Diagnosis not present

## 2018-06-05 LAB — MICROALBUMIN, URINE WAIVED
CREATININE, URINE WAIVED: 100 mg/dL (ref 10–300)
Microalb, Ur Waived: 80 mg/L — ABNORMAL HIGH (ref 0–19)

## 2018-06-05 NOTE — Patient Instructions (Signed)

## 2018-06-05 NOTE — Assessment & Plan Note (Signed)
Chronic, ongoing.  On Lisinopril for kidney protection.  CMP and urine check today.

## 2018-06-05 NOTE — Progress Notes (Signed)
BP 119/73 (BP Location: Left Arm, Patient Position: Sitting, Cuff Size: Normal)   Pulse 75   Temp 97.9 F (36.6 C) (Oral)   Ht '5\' 9"'  (1.753 m)   Wt 222 lb 6.4 oz (100.9 kg)   SpO2 99%   BMI 32.84 kg/m    Subjective:    Patient ID: Fernando Frederick, male    DOB: [birth year], 62 y.o.   MRN: [id number]  HPI: Fernando Frederick is a 62 y.o. male presents for annual physical.  Chief Complaint  Patient presents with  . Annual Exam    No Complaints   HYPERTENSION WITH CKD Hypertension status: controlled  Satisfied with current treatment? yes Duration of hypertension: chronic BP monitoring frequency:  not checking BP range:  BP medication side effects:  no Medication compliance: excellent compliance Previous BP meds: does not recall Aspirin: yes Recurrent headaches: no Visual changes: no Palpitations: no Dyspnea: no Chest pain: no Lower extremity edema: no Dizzy/lightheaded: no  Has a bicycle that he "rides up and down the road".  Does this about 2-3 times a week.    DIABETES Hypoglycemic episodes:no Polydipsia/polyuria: no Visual disturbance: no Chest pain: no Paresthesias: no Glucose Monitoring: yes  Accucheck frequency: every once and awhile  Fasting glucose:  Post prandial:  Evening:  Before meals: Taking Insulin?: no  Long acting insulin:  Short acting insulin: Blood Pressure Monitoring: not checking Retinal Examination: Up to Date Foot Exam: Up to Date Diabetic Education: Completed Pneumovax: Up to Date Influenza: Up to Date Aspirin: yes  Blood sugars when checked at home run 95-134.  Does not follow diabetic diet at home, "eats what he likes to eat".    Relevant past medical, surgical, family and social history reviewed and updated as indicated. Interim medical history since our last visit reviewed. Allergies and medications reviewed and updated.  Review of Systems  Constitutional: Negative for activity change, fatigue and fever.  HENT: Negative for  congestion, ear pain, rhinorrhea, sinus pressure, sinus pain and sore throat.   Eyes: Negative for pain, discharge and redness.  Respiratory: Negative for cough, chest tightness, shortness of breath and wheezing.   Cardiovascular: Negative for chest pain, palpitations and leg swelling.  Gastrointestinal: Negative for abdominal distention and abdominal pain.  Endocrine: Negative for cold intolerance, polydipsia, polyphagia and polyuria.  Genitourinary: Negative for difficulty urinating.  Musculoskeletal: Negative for back pain, myalgias and neck pain.  Skin: Negative.   Allergic/Immunologic: Negative.   Neurological: Negative for dizziness, syncope, numbness and headaches.  Hematological: Negative.   Psychiatric/Behavioral: Negative for behavioral problems.   Per HPI unless specifically indicated above     Objective:    BP 119/73 (BP Location: Left Arm, Patient Position: Sitting, Cuff Size: Normal)   Pulse 75   Temp 97.9 F (36.6 C) (Oral)   Ht '5\' 9"'  (1.753 m)   Wt 222 lb 6.4 oz (100.9 kg)   SpO2 99%   BMI 32.84 kg/m   Wt Readings from Last 3 Encounters:  06/05/18 222 lb 6.4 oz (100.9 kg)  12/16/17 223 lb 12.8 oz (101.5 kg)  11/14/17 219 lb 4.8 oz (99.5 kg)    Physical Exam  Constitutional: He is oriented to person, place, and time. He appears well-developed and well-nourished.  HENT:  Head: Normocephalic and atraumatic.  Right Ear: External ear normal.  Left Ear: External ear normal.  Nose: Nose normal.  Mouth/Throat: Oropharynx is clear and moist.  Eyes: Pupils are equal, round, and reactive to light. Conjunctivae and EOM  are normal.  Neck: Normal range of motion. Neck supple.  Cardiovascular: Normal rate, regular rhythm, normal heart sounds and intact distal pulses.  Pulmonary/Chest: Effort normal and breath sounds normal.  Abdominal: Soft. Bowel sounds are normal.  Genitourinary:  Genitourinary Comments: Deferred per patient request.  Reports no issues.    Musculoskeletal: Normal range of motion.  Neurological: He is alert and oriented to person, place, and time.  Skin: Skin is warm and dry.  Psychiatric: He has a normal mood and affect. His behavior is normal.  Nursing note and vitals reviewed.   Diabetic Foot Exam - Simple   Simple Foot Form Visual Inspection No deformities, no ulcerations, no other skin breakdown bilaterally:  Yes Sensation Testing Intact to touch and monofilament testing bilaterally:  Yes Pulse Check Posterior Tibialis and Dorsalis pulse intact bilaterally:  Yes Comments     Results for orders placed or performed in visit on 12/16/17  BLADDER SCAN AMB NON-IMAGING  Result Value Ref Range   Scan Result 80m       Assessment & Plan:   Problem List Items Addressed This Visit      Cardiovascular and Mediastinum   Benign hypertension with chronic kidney disease    Chronic, ongoing.  BP at goal 119/73.  Goal <130/80.  Continue on current medication regimen.        Endocrine   Type 2 diabetes mellitus without complication, without long-term current use of insulin (HCC)    Chronic, ongoing.  Continues on Metformin.  Reports some fatigue.  A1C and B12 level today.        Genitourinary   CKD (chronic kidney disease), stage III (HCC)    Chronic, ongoing.  On Lisinopril for kidney protection.  CMP and urine check today.       Other Visit Diagnoses    Annual physical exam    -  Primary   Relevant Orders   B12   HgB A1c   Comp Met (CMET)   TSH   Lipid Profile   Microalbumin, Urine Waived (STAT)       Follow up plan: Return in about 6 months (around 12/05/2018), or if symptoms worsen or fail to improve, for for HTN and T2DM.

## 2018-06-05 NOTE — Assessment & Plan Note (Signed)
Chronic, ongoing.  Continues on Metformin.  Reports some fatigue.  A1C and B12 level today.

## 2018-06-05 NOTE — Assessment & Plan Note (Signed)
Chronic, ongoing.  BP at goal 119/73.  Goal <130/80.  Continue on current medication regimen.

## 2018-06-06 LAB — COMPREHENSIVE METABOLIC PANEL
ALBUMIN: 4.4 g/dL (ref 3.6–4.8)
ALK PHOS: 67 IU/L (ref 39–117)
ALT: 17 IU/L (ref 0–44)
AST: 20 IU/L (ref 0–40)
Albumin/Globulin Ratio: 1.5 (ref 1.2–2.2)
BILIRUBIN TOTAL: 0.3 mg/dL (ref 0.0–1.2)
BUN / CREAT RATIO: 12 (ref 10–24)
BUN: 16 mg/dL (ref 8–27)
CHLORIDE: 104 mmol/L (ref 96–106)
CO2: 24 mmol/L (ref 20–29)
Calcium: 9.6 mg/dL (ref 8.6–10.2)
Creatinine, Ser: 1.34 mg/dL — ABNORMAL HIGH (ref 0.76–1.27)
GFR calc Af Amer: 64 mL/min/{1.73_m2} (ref >59)
GFR calc non Af Amer: 56 mL/min/{1.73_m2} — ABNORMAL LOW (ref >59)
GLUCOSE: 94 mg/dL (ref 65–99)
Globulin, Total: 3 g/dL (ref 1.5–4.5)
Potassium: 4.4 mmol/L (ref 3.5–5.2)
Sodium: 144 mmol/L (ref 134–144)
Total Protein: 7.4 g/dL (ref 6.0–8.5)

## 2018-06-06 LAB — LIPID PANEL
CHOL/HDL RATIO: 3.4 ratio (ref 0.0–5.0)
Cholesterol, Total: 122 mg/dL (ref 100–199)
HDL: 36 mg/dL — ABNORMAL LOW (ref >39)
LDL CALC: 53 mg/dL (ref 0–99)
TRIGLYCERIDES: 164 mg/dL — AB (ref 0–149)
VLDL CHOLESTEROL CAL: 33 mg/dL (ref 5–40)

## 2018-06-06 LAB — VITAMIN B12: VITAMIN B 12: 584 pg/mL (ref 232–1245)

## 2018-06-06 LAB — HEMOGLOBIN A1C
Est. average glucose Bld gHb Est-mCnc: 128 mg/dL
Hgb A1c MFr Bld: 6.1 % — ABNORMAL HIGH (ref 4.8–5.6)

## 2018-06-06 LAB — TSH: TSH: 0.968 u[IU]/mL (ref 0.450–4.500)

## 2018-06-15 ENCOUNTER — Other Ambulatory Visit: Payer: Self-pay | Admitting: Family Medicine

## 2018-06-15 DIAGNOSIS — R972 Elevated prostate specific antigen [PSA]: Secondary | ICD-10-CM

## 2018-06-17 ENCOUNTER — Other Ambulatory Visit: Payer: Managed Care, Other (non HMO)

## 2018-06-17 DIAGNOSIS — R972 Elevated prostate specific antigen [PSA]: Secondary | ICD-10-CM

## 2018-06-18 LAB — PSA: Prostate Specific Ag, Serum: 2.9 ng/mL (ref 0.0–4.0)

## 2018-06-23 ENCOUNTER — Ambulatory Visit: Payer: Managed Care, Other (non HMO) | Admitting: Urology

## 2018-07-14 NOTE — Progress Notes (Signed)
07/14/2018 3:37 PM   [name] 10-18-1955 [id number]  Referring provider: Kathrine Haddock, NP [address], [place] [address]  Chief Complaint  Patient presents with  . Benign Prostatic Hypertrophy    85month    HPI: Fernando Frederick is a 62 yo M who returns today for management and evaluation of a fluctuating PSA/BPH.  He was started on finasteride 6 months ago.  The patient reports of frequent urination "sometimes". Pt reports of getting up 2x a night on average.  Overall, his urinary symptoms are stable.  He also reports of snoring which cna be potentially associated with sleep apnea. Previously failed to tolerated flomax.  IPSS as below.  He has a personal history of a fluctuating PSA. PSA trend below. His most recent PSA is 2.9 (finsteride started 6 months ago). Rectal exam today indicated enlarged prostate but otherwise unremarkable.  He also has a CT scan from 2014 indicating mild prostamegaly.    Component     Latest Ref Rng & Units 04/23/2016 05/02/2017 06/13/2017 12/11/2017  Prostate Specific Ag, Serum     0.0 - 4.0 ng/mL 3.1 4.1 (H) 3.6 4.5 (H)   Component     Latest Ref Rng & Units 06/17/2018  Prostate Specific Ag, Serum     0.0 - 4.0 ng/mL 2.9    PMH: Past Medical History:  Diagnosis Date  . Benign hypertension with chronic kidney disease 04/24/2015  . Chronic kidney disease   . CKD (chronic kidney disease), stage III ([place]) 04/24/2015  . Diabetes ([place]) 04/24/2015  . Diabetes mellitus without complication ([place])   . Gout 04/24/2015  . Hyperlipidemia   . Hypertension     Surgical History: Past Surgical History:  Procedure Laterality Date  . partial thumb amputation      Home Medications:  Allergies as of 07/15/2018   No Known Allergies     Medication List        Accurate as of 07/15/18  3:37 PM. Always use your most recent med list.          amLODipine 10 MG tablet Commonly known as:  NORVASC Take 1 tablet (10 mg total) by mouth daily.     aspirin 81 MG tablet Take 81 mg by mouth daily.   finasteride 5 MG tablet Commonly known as:  PROSCAR Take 1 tablet (5 mg total) by mouth daily.   Fish Oil 1000 MG Caps Take 1,000 mg by mouth daily.   lisinopril-hydrochlorothiazide 20-12.5 MG tablet Commonly known as:  PRINZIDE,ZESTORETIC Take 1 tablet by mouth daily.   metFORMIN 500 MG tablet Commonly known as:  GLUCOPHAGE Take 1 tablet (500 mg total) by mouth 2 (two) times daily with a meal.   multivitamin tablet Take 1 tablet by mouth daily.   rosuvastatin 20 MG tablet Commonly known as:  CRESTOR Take 1 tablet (20 mg total) by mouth daily.   Vitamin D-3 25 MCG (1000 UT) Caps Take 1,000 Units by mouth daily.       Allergies: No Known Allergies  Family History: Family History  Problem Relation Age of Onset  . Cancer Mother        lung  . Hypertension Mother   . Diabetes Father   . Hypertension Father   . Kidney disease Father   . Kidney disease Brother   . Hypertension Sister   . Hypertension Sister   . Hypertension Sister     Social History:  reports that he quit smoking about 13 years ago. He  has never used smokeless tobacco. He reports that he drinks about 6.0 standard drinks of alcohol per week. He reports that he does not use drugs.  ROS: UROLOGY Frequent Urination?: Yes Hard to postpone urination?: No Burning/pain with urination?: No Get up at night to urinate?: Yes Leakage of urine?: No Urine stream starts and stops?: Yes Trouble starting stream?: No Do you have to strain to urinate?: No Blood in urine?: No Urinary tract infection?: No Sexually transmitted disease?: No Injury to kidneys or bladder?: No Painful intercourse?: No Weak stream?: No Erection problems?: Yes Penile pain?: No  Gastrointestinal Nausea?: No Vomiting?: No Indigestion/heartburn?: No Diarrhea?: No Constipation?: No  Constitutional Fever: No Night sweats?: No Weight loss?: No Fatigue?: No  Skin Skin  rash/lesions?: No Itching?: No  Eyes Blurred vision?: No Double vision?: No  Ears/Nose/Throat Sore throat?: No Sinus problems?: No  Hematologic/Lymphatic Swollen glands?: No Easy bruising?: No  Cardiovascular Leg swelling?: No Chest pain?: No  Respiratory Cough?: No Shortness of breath?: No  Endocrine Excessive thirst?: No  Musculoskeletal Back pain?: No Joint pain?: No  Neurological Headaches?: No Dizziness?: No  Psychologic Depression?: No Anxiety?: No  Physical Exam: BP 112/74   Pulse 97   Ht 5\' 10"  (1.778 m)   Wt 222 lb (100.7 kg)   BMI 31.85 kg/m   Constitutional:  Alert and oriented, No acute distress. HEENT: [place] AT, moist mucus membranes.  Trachea midline, no masses. Cardiovascular: No clubbing, cyanosis, or edema. Respiratory: Normal respiratory effort, no increased work of breathing. Rectal: Normal sphinctor tone, enlarged prostate 50 g, rubbery lateral nodes, no nodules, non-tender  Skin: No rashes, bruises or suspicious lesions. Neurologic: Grossly intact, no focal deficits, moving all 4 extremities. Psychiatric: Normal mood and affect.  Laboratory Data:  Lab Results  Component Value Date   CREATININE 1.34 (H) 06/05/2018    Lab Results  Component Value Date   HGBA1C 6.1 (H) 06/05/2018   PSA trend as above   Pertinent Imaging: Results for orders placed or performed in visit on 07/15/18  Bladder Scan (Post Void Residual) in office  Result Value Ref Range   Scan Result 12ml     Assessment & Plan:    1. Benign prostatic hyperplasia with urinary obstruction Stable urinary symptoms Did not tolerate Flomax Continue Finasteride  - BLADDER SCAN AMB NON-IMAGING  2. Elevated PSA History of fluctuating PSA  Rectal exam today is reassuring Appropriate reduction appreciated with initiation of finasteride 6 months ago We will continue to follow his PSA closely, every 6 months   3. Nocturia History of sleeping issues and fatigue    Refer back to PCP for referraly   Return in about 6 months (around 06/17/2018) for PSA only.   Return in one year for IPSS, PVR, DRE, PSA   Return for PSA only 6 months,  MD visit with IPSS/PVR/DRE/PSA in 1 yea.  [facility] [address], [place], [place] [address] [phone]  I, Lucas Mallow, am acting as a scribe for Dr. Hollice Espy,  I have reviewed the above documentation for accuracy and completeness, and I agree with the above.   Hollice Espy, MD

## 2018-07-15 ENCOUNTER — Encounter: Payer: Self-pay | Admitting: Urology

## 2018-07-15 ENCOUNTER — Ambulatory Visit: Payer: Managed Care, Other (non HMO) | Admitting: Urology

## 2018-07-15 VITALS — BP 112/74 | HR 97 | Ht 70.0 in | Wt 222.0 lb

## 2018-07-15 DIAGNOSIS — R351 Nocturia: Secondary | ICD-10-CM | POA: Diagnosis not present

## 2018-07-15 DIAGNOSIS — R972 Elevated prostate specific antigen [PSA]: Secondary | ICD-10-CM | POA: Diagnosis not present

## 2018-07-15 DIAGNOSIS — N401 Enlarged prostate with lower urinary tract symptoms: Secondary | ICD-10-CM | POA: Diagnosis not present

## 2018-07-15 DIAGNOSIS — N138 Other obstructive and reflux uropathy: Secondary | ICD-10-CM | POA: Diagnosis not present

## 2018-07-15 LAB — BLADDER SCAN AMB NON-IMAGING

## 2018-07-25 ENCOUNTER — Other Ambulatory Visit: Payer: Self-pay | Admitting: Unknown Physician Specialty

## 2018-08-09 ENCOUNTER — Other Ambulatory Visit: Payer: Self-pay | Admitting: Unknown Physician Specialty

## 2018-08-24 ENCOUNTER — Other Ambulatory Visit: Payer: Self-pay | Admitting: Unknown Physician Specialty

## 2018-10-17 ENCOUNTER — Other Ambulatory Visit: Payer: Self-pay | Admitting: Nurse Practitioner

## 2018-10-19 ENCOUNTER — Other Ambulatory Visit: Payer: Self-pay

## 2018-10-19 MED ORDER — METFORMIN HCL 500 MG PO TABS: 500.0000 mg | ORAL_TABLET | Freq: Two times a day (BID) | ORAL | 0 refills | Status: DC

## 2018-10-19 NOTE — Telephone Encounter (Signed)
Requested Prescriptions  Pending Prescriptions Disp Refills  . metFORMIN (GLUCOPHAGE) 500 MG tablet [Pharmacy Med Name: METFORMIN HCL 500 MG TABLET] 60 tablet 0    Sig: Take 1 tablet (500 mg total) by mouth 2 (two) times daily with a meal.     Endocrinology:  Diabetes - Biguanides Failed - 10/17/2018  1:54 PM      Failed - Cr in normal range and within 360 days    Creatinine  Date Value Ref Range Status  10/26/2013 1.46 (H) 0.60 - 1.30 mg/dL Final   Creatinine, Ser  Date Value Ref Range Status  06/05/2018 1.34 (H) 0.76 - 1.27 mg/dL Final         Passed - HBA1C is between 0 and 7.9 and within 180 days    Hemoglobin A1C  Date Value Ref Range Status  04/23/2016 6.4%  Final   HB A1C (BAYER DCA - WAIVED)  Date Value Ref Range Status  11/14/2017 5.8 <7.0 % Final    Comment:                                          Diabetic Adult            <7.0                                       Healthy Adult        4.3 - 5.7                                                           (DCCT/NGSP) American Diabetes Association's Summary of Glycemic Recommendations for Adults with Diabetes: Hemoglobin A1c <7.0%. More stringent glycemic goals (A1c <6.0%) may further reduce complications at the cost of increased risk of hypoglycemia.    Hgb A1c MFr Bld  Date Value Ref Range Status  06/05/2018 6.1 (H) 4.8 - 5.6 % Final    Comment:             Prediabetes: 5.7 - 6.4          Diabetes: >6.4          Glycemic control for adults with diabetes: <7.0          Passed - eGFR in normal range and within 360 days    EGFR (African American)  Date Value Ref Range Status  10/26/2013 >60  Final   GFR calc Af Amer  Date Value Ref Range Status  06/05/2018 64 >59 mL/min/1.73 Final   EGFR (Non-African Amer.)  Date Value Ref Range Status  10/26/2013 52 (L)  Final    Comment:    eGFR values <56m/min/1.73 m2 may be an indication of chronic kidney disease (CKD). Calculated eGFR is useful in patients with  stable renal function. The eGFR calculation will not be reliable in acutely ill patients when serum creatinine is changing rapidly. It is not useful in  patients on dialysis. The eGFR calculation may not be applicable to patients at the low and high extremes of body sizes, pregnant women, and vegetarians.    GFR calc non Af Amer  Date Value Ref Range  Status  06/05/2018 56 (L) >59 mL/min/1.73 Final         Passed - Valid encounter within last 6 months    Recent Outpatient Visits          4 months ago Annual physical exam   [facility] [place], Jolene T, NP   11 months ago Type 2 diabetes mellitus without complication, without long-term current use of insulin ([place])   [place] Kathrine Haddock, NP   1 year ago Annual physical exam   [facility] Kathrine Haddock, NP   1 year ago Benign hypertension with chronic kidney disease   [facility] Kathrine Haddock, NP   2 years ago Health care maintenance   [facility] Kathrine Haddock, NP      Future Appointments            In 1 month Cannady, Barbaraann Faster, NP [employer], [place]   In 9 months Hollice Espy, MD [place]

## 2018-10-19 NOTE — Telephone Encounter (Signed)
Fax from pharmacy. Refill for Metformin 500mg .  Upcoming appointment 12/11/2018.

## 2018-10-28 ENCOUNTER — Other Ambulatory Visit: Payer: Self-pay | Admitting: Physician Assistant

## 2018-12-09 ENCOUNTER — Telehealth: Payer: Self-pay | Admitting: Nurse Practitioner

## 2018-12-09 NOTE — Telephone Encounter (Signed)
Called pt to set up virtual visit for Friday no answer, left voicemail.

## 2018-12-11 ENCOUNTER — Ambulatory Visit: Payer: Managed Care, Other (non HMO) | Admitting: Nurse Practitioner

## 2018-12-30 ENCOUNTER — Other Ambulatory Visit: Payer: Self-pay | Admitting: *Deleted

## 2018-12-30 MED ORDER — FINASTERIDE 5 MG PO TABS: 5.0000 mg | ORAL_TABLET | Freq: Every day | ORAL | 3 refills | Status: DC

## 2019-01-08 ENCOUNTER — Other Ambulatory Visit: Payer: Self-pay

## 2019-01-08 ENCOUNTER — Encounter: Payer: Self-pay | Admitting: Nurse Practitioner

## 2019-01-08 ENCOUNTER — Ambulatory Visit: Payer: Managed Care, Other (non HMO) | Admitting: Nurse Practitioner

## 2019-01-08 VITALS — BP 122/73 | HR 87 | Temp 98.4°F | Ht 70.0 in | Wt 218.0 lb

## 2019-01-08 DIAGNOSIS — E1169 Type 2 diabetes mellitus with other specified complication: Secondary | ICD-10-CM

## 2019-01-08 DIAGNOSIS — E1121 Type 2 diabetes mellitus with diabetic nephropathy: Secondary | ICD-10-CM

## 2019-01-08 DIAGNOSIS — N183 Chronic kidney disease, stage 3 unspecified: Secondary | ICD-10-CM

## 2019-01-08 DIAGNOSIS — I129 Hypertensive chronic kidney disease with stage 1 through stage 4 chronic kidney disease, or unspecified chronic kidney disease: Secondary | ICD-10-CM

## 2019-01-08 DIAGNOSIS — E785 Hyperlipidemia, unspecified: Secondary | ICD-10-CM

## 2019-01-08 DIAGNOSIS — E669 Obesity, unspecified: Secondary | ICD-10-CM | POA: Insufficient documentation

## 2019-01-08 LAB — MICROALBUMIN, URINE WAIVED
Creatinine, Urine Waived: 300 mg/dL (ref 10–300)
Microalb, Ur Waived: 80 mg/L — ABNORMAL HIGH (ref 0–19)

## 2019-01-08 LAB — LIPID PANEL PICCOLO, WAIVED
Chol/HDL Ratio Piccolo,Waive: 3.3 mg/dL
Cholesterol Piccolo, Waived: 143 mg/dL (ref <200)
HDL Chol Piccolo, Waived: 44 mg/dL — ABNORMAL LOW (ref >59)
LDL Chol Calc Piccolo Waived: 35 mg/dL (ref <100)
Triglycerides Piccolo,Waived: 324 mg/dL — ABNORMAL HIGH (ref <150)
VLDL Chol Calc Piccolo,Waive: 65 mg/dL — ABNORMAL HIGH (ref <30)

## 2019-01-08 LAB — BAYER DCA HB A1C WAIVED: HB A1C (BAYER DCA - WAIVED): 6 % (ref <7.0)

## 2019-01-08 NOTE — Assessment & Plan Note (Signed)
Recommend continued focus on health diet choices and regular physical activity (30 minutes 5 days a week). 

## 2019-01-08 NOTE — Patient Instructions (Signed)
Carbohydrate Counting for Diabetes Mellitus, Adult  Carbohydrate counting is a method of keeping track of how many carbohydrates you eat. Eating carbohydrates naturally increases the amount of sugar (glucose) in the blood. Counting how many carbohydrates you eat helps keep your blood glucose within normal limits, which helps you manage your diabetes (diabetes mellitus). It is important to know how many carbohydrates you can safely have in each meal. This is different for every person. A diet and nutrition specialist (registered dietitian) can help you make a meal plan and calculate how many carbohydrates you should have at each meal and snack. Carbohydrates are found in the following foods:  Grains, such as breads and cereals.  Dried beans and soy products.  Starchy vegetables, such as potatoes, peas, and corn.  Fruit and fruit juices.  Milk and yogurt.  Sweets and snack foods, such as cake, cookies, candy, chips, and soft drinks. How do I count carbohydrates? There are two ways to count carbohydrates in food. You can use either of the methods or a combination of both. Reading "Nutrition Facts" on packaged food The "Nutrition Facts" list is included on the labels of almost all packaged foods and beverages in the [place] It includes:  The serving size.  Information about nutrients in each serving, including the grams (g) of carbohydrate per serving. To use the "Nutrition Facts":  Decide how many servings you will have.  Multiply the number of servings by the number of carbohydrates per serving.  The resulting number is the total amount of carbohydrates that you will be having. Learning standard serving sizes of other foods When you eat carbohydrate foods that are not packaged or do not include "Nutrition Facts" on the label, you need to measure the servings in order to count the amount of carbohydrates:  Measure the foods that you will eat with a food scale or measuring cup, if needed.   Decide how many standard-size servings you will eat.  Multiply the number of servings by 15. Most carbohydrate-rich foods have about 15 g of carbohydrates per serving. ? For example, if you eat 8 oz (170 g) of strawberries, you will have eaten 2 servings and 30 g of carbohydrates (2 servings x 15 g = 30 g).  For foods that have more than one food mixed, such as soups and casseroles, you must count the carbohydrates in each food that is included. The following list contains standard serving sizes of common carbohydrate-rich foods. Each of these servings has about 15 g of carbohydrates:   hamburger bun or  English muffin.   oz (15 mL) syrup.   oz (14 g) jelly.  1 slice of bread.  1 six-inch tortilla.  3 oz (85 g) cooked rice or pasta.  4 oz (113 g) cooked dried beans.  4 oz (113 g) starchy vegetable, such as peas, corn, or potatoes.  4 oz (113 g) hot cereal.  4 oz (113 g) mashed potatoes or  of a large baked potato.  4 oz (113 g) canned or frozen fruit.  4 oz (120 mL) fruit juice.  4-6 crackers.  6 chicken nuggets.  6 oz (170 g) unsweetened dry cereal.  6 oz (170 g) plain fat-free yogurt or yogurt sweetened with artificial sweeteners.  8 oz (240 mL) milk.  8 oz (170 g) fresh fruit or one small piece of fruit.  24 oz (680 g) popped popcorn. Example of carbohydrate counting Sample meal  3 oz (85 g) chicken breast.  6 oz (170 g)   brown rice.  4 oz (113 g) corn.  8 oz (240 mL) milk.  8 oz (170 g) strawberries with sugar-free whipped topping. Carbohydrate calculation 1. Identify the foods that contain carbohydrates: ? Rice. ? Corn. ? Milk. ? Strawberries. 2. Calculate how many servings you have of each food: ? 2 servings rice. ? 1 serving corn. ? 1 serving milk. ? 1 serving strawberries. 3. Multiply each number of servings by 15 g: ? 2 servings rice x 15 g = 30 g. ? 1 serving corn x 15 g = 15 g. ? 1 serving milk x 15 g = 15 g. ? 1 serving  strawberries x 15 g = 15 g. 4. Add together all of the amounts to find the total grams of carbohydrates eaten: ? 30 g + 15 g + 15 g + 15 g = 75 g of carbohydrates total. Summary  Carbohydrate counting is a method of keeping track of how many carbohydrates you eat.  Eating carbohydrates naturally increases the amount of sugar (glucose) in the blood.  Counting how many carbohydrates you eat helps keep your blood glucose within normal limits, which helps you manage your diabetes.  A diet and nutrition specialist (registered dietitian) can help you make a meal plan and calculate how many carbohydrates you should have at each meal and snack. This information is not intended to replace advice given to you by your health care provider. Make sure you discuss any questions you have with your health care provider. Document Released: 08/12/2005 Document Revised: 02/19/2017 Document Reviewed: 01/24/2016 Elsevier Interactive Patient Education  2019 Elsevier Inc.  

## 2019-01-08 NOTE — Assessment & Plan Note (Signed)
Chronic, ongoing.  Continue current medication regimen.  Lipid panel and CMP today, adjust dose as needed.

## 2019-01-08 NOTE — Progress Notes (Signed)
BP 122/73   Pulse 87   Temp 98.4 F (36.9 C) (Oral)   Ht 5\' 10"  (1.778 m)   Wt 218 lb (98.9 kg)   SpO2 97%   BMI 31.28 kg/m    Subjective:    Patient ID: Fernando Frederick, male    DOB: Dec 03, 1955, 63 y.o.   MRN: [id number]  HPI: Fernando Frederick is a 63 y.o. male  Chief Complaint  Patient presents with  . Hypertension    f/u  . Diabetes   DIABETES Last A1C was 6.1% in October. Continues on Metformin 500 MG BID. Hypoglycemic episodes:no Polydipsia/polyuria: no Visual disturbance: no Chest pain: no Paresthesias: no Glucose Monitoring: yes  Accucheck frequency: rarely  Fasting glucose: 87- 118              Post prandial:  Evening:  Before meals: Taking Insulin?: no  Long acting insulin:  Short acting insulin: Blood Pressure Monitoring: not checking Retinal Examination: Not up to Date, Dr Ellin Mayhew patient reports they notify him when time Foot Exam: Up to Date Pneumovax: Up to Date Influenza: Up to Date Aspirin: yes   HYPERTENSION / HYPERLIPIDEMIA Current Amlodipine, Lisinopril-HCTZ and Crestor. Satisfied with current treatment? yes Duration of hypertension: chronic BP monitoring frequency: not checking BP range:  BP medication side effects: no Duration of hyperlipidemia: chronic Cholesterol medication side effects: no Cholesterol supplements: none Medication compliance: good compliance Aspirin: yes Recent stressors: no Recurrent headaches: no Visual changes: no Palpitations: no Dyspnea: no Chest pain: no Lower extremity edema: no Dizzy/lightheaded: no   CHRONIC KIDNEY DISEASE Continues on Lisinopril for kidney protection.  Last urine ALB 80 and A:C 30-300, remains same today. CKD status: stable Medications renally dose: yes Previous renal evaluation: no Pneumovax:  Up to Date Influenza Vaccine:  Up to Date  Relevant past medical, surgical, family and social history reviewed and updated as indicated. Interim medical history since our last visit  reviewed. Allergies and medications reviewed and updated.  Review of Systems  Constitutional: Negative for activity change, diaphoresis, fatigue and fever.  Respiratory: Negative for cough, chest tightness, shortness of breath and wheezing.   Cardiovascular: Negative for chest pain, palpitations and leg swelling.  Gastrointestinal: Negative for abdominal distention, abdominal pain, constipation, diarrhea, nausea and vomiting.  Endocrine: Negative for cold intolerance, heat intolerance, polydipsia, polyphagia and polyuria.  Musculoskeletal: Negative.   Skin: Negative.   Neurological: Negative for dizziness, syncope, weakness, light-headedness, numbness and headaches.  Psychiatric/Behavioral: Negative.     Per HPI unless specifically indicated above     Objective:    BP 122/73   Pulse 87   Temp 98.4 F (36.9 C) (Oral)   Ht 5\' 10"  (1.778 m)   Wt 218 lb (98.9 kg)   SpO2 97%   BMI 31.28 kg/m   Wt Readings from Last 3 Encounters:  01/08/19 218 lb (98.9 kg)  07/15/18 222 lb (100.7 kg)  06/05/18 222 lb 6.4 oz (100.9 kg)    Physical Exam Vitals signs and nursing note reviewed.  Constitutional:      Appearance: He is well-developed.  HENT:     Head: Normocephalic and atraumatic.     Right Ear: Hearing normal. No drainage.     Left Ear: Hearing normal. No drainage.     Mouth/Throat:     Pharynx: Uvula midline.  Eyes:     General: Lids are normal.        Right eye: No discharge.        Left eye:  No discharge.     Conjunctiva/sclera: Conjunctivae normal.     Pupils: Pupils are equal, round, and reactive to light.  Neck:     Musculoskeletal: Normal range of motion and neck supple.     Thyroid: No thyromegaly.     Vascular: No carotid bruit or JVD.     Trachea: Trachea normal.  Cardiovascular:     Rate and Rhythm: Normal rate and regular rhythm.     Heart sounds: Normal heart sounds, S1 normal and S2 normal. No murmur. No gallop.   Pulmonary:     Effort: Pulmonary effort  is normal.     Breath sounds: Normal breath sounds.  Abdominal:     General: Bowel sounds are normal.     Palpations: Abdomen is soft. There is no hepatomegaly or splenomegaly.  Musculoskeletal: Normal range of motion.     Right lower leg: No edema.     Left lower leg: No edema.  Skin:    General: Skin is warm and dry.     Capillary Refill: Capillary refill takes less than 2 seconds.     Findings: No rash.  Neurological:     Mental Status: He is alert and oriented to person, place, and time.     Deep Tendon Reflexes: Reflexes are normal and symmetric.  Psychiatric:        Mood and Affect: Mood normal.        Behavior: Behavior normal.        Thought Content: Thought content normal.        Judgment: Judgment normal.    Diabetic Foot Exam - Simple   Simple Foot Form Visual Inspection No deformities, no ulcerations, no other skin breakdown bilaterally:  Yes Sensation Testing Intact to touch and monofilament testing bilaterally:  Yes Pulse Check Posterior Tibialis and Dorsalis pulse intact bilaterally:  Yes Comments     Results for orders placed or performed in visit on 01/08/19  Bayer DCA Hb A1c Waived  Result Value Ref Range   HB A1C (BAYER DCA - WAIVED) 6.0 <7.0 %  Microalbumin, Urine Waived  Result Value Ref Range   Microalb, Ur Waived 80 (H) 0 - 19 mg/L   Creatinine, Urine Waived 300 10 - 300 mg/dL   Microalb/Creat Ratio 30-300 (H) <30 mg/g  Lipid Panel Piccolo, Waived  Result Value Ref Range   Cholesterol Piccolo, Waived 143 <200 mg/dL   HDL Chol Piccolo, Waived 44 (L) >59 mg/dL   Triglycerides Piccolo,Waived 324 (H) <150 mg/dL   Chol/HDL Ratio Piccolo,Waive 3.3 mg/dL   LDL Chol Calc Piccolo Waived 35 <100 mg/dL   VLDL Chol Calc Piccolo,Waive 65 (H) <30 mg/dL      Assessment & Plan:   Problem List Items Addressed This Visit      Cardiovascular and Mediastinum   Benign hypertension with chronic kidney disease    Chronic, ongoing.  BP remains below goal.   Continue current medication regimen.  Labs today.      Relevant Orders   Comprehensive metabolic panel     Endocrine   Hyperlipidemia associated with type 2 diabetes mellitus (HCC)    Chronic, ongoing.  Continue current medication regimen.  Lipid panel and CMP today, adjust dose as needed.      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  A1C 6.0% today.  Continue current medication regimen, including Lisinopril for kidney protection.  Relevant Orders   Bayer DCA Hb A1c Waived (Completed)   Microalbumin, Urine Waived (Completed)     Genitourinary   CKD (chronic kidney disease), stage III (HCC)    Chronic, ongoing.  Continue Lisinopril for kidney protection.  CMP today.         Other   Obesity (BMI 30-39.9)    Recommend continued focus on health diet choices and regular physical activity (30 minutes 5 days a week).          Follow up plan: Return in about 6 months (around 07/11/2019) for T2DM, HTN/HLD .

## 2019-01-08 NOTE — Assessment & Plan Note (Signed)
Chronic, ongoing.  BP remains below goal.  Continue current medication regimen.  Labs today.

## 2019-01-08 NOTE — Assessment & Plan Note (Addendum)
Chronic, ongoing.  A1C 6.0% today.  Continue current medication regimen, including Lisinopril for kidney protection.

## 2019-01-08 NOTE — Assessment & Plan Note (Signed)
Chronic, ongoing.  Continue Lisinopril for kidney protection.  CMP today.

## 2019-01-09 LAB — COMPREHENSIVE METABOLIC PANEL
ALT: 27 IU/L (ref 0–44)
AST: 27 IU/L (ref 0–40)
Albumin/Globulin Ratio: 1.7 (ref 1.2–2.2)
Albumin: 4.5 g/dL (ref 3.8–4.8)
Alkaline Phosphatase: 67 IU/L (ref 39–117)
BUN/Creatinine Ratio: 11 (ref 10–24)
BUN: 18 mg/dL (ref 8–27)
Bilirubin Total: 0.3 mg/dL (ref 0.0–1.2)
CO2: 22 mmol/L (ref 20–29)
Calcium: 9.8 mg/dL (ref 8.6–10.2)
Chloride: 106 mmol/L (ref 96–106)
Creatinine, Ser: 1.67 mg/dL — ABNORMAL HIGH (ref 0.76–1.27)
GFR calc Af Amer: 49 mL/min/{1.73_m2} — ABNORMAL LOW (ref >59)
GFR calc non Af Amer: 43 mL/min/{1.73_m2} — ABNORMAL LOW (ref >59)
Globulin, Total: 2.7 g/dL (ref 1.5–4.5)
Glucose: 110 mg/dL — ABNORMAL HIGH (ref 65–99)
Potassium: 4.5 mmol/L (ref 3.5–5.2)
Sodium: 143 mmol/L (ref 134–144)
Total Protein: 7.2 g/dL (ref 6.0–8.5)

## 2019-01-11 ENCOUNTER — Telehealth: Payer: Self-pay | Admitting: Nurse Practitioner

## 2019-01-11 ENCOUNTER — Encounter: Payer: Self-pay | Admitting: Nurse Practitioner

## 2019-01-11 NOTE — Telephone Encounter (Signed)
Called pt and advised of Fernando Frederick's message.

## 2019-01-11 NOTE — Telephone Encounter (Signed)
I sent letter out as I believe at visit he said he preferred letter.  Labs were normal.

## 2019-01-11 NOTE — Telephone Encounter (Signed)
Pt called in stating that he missed a call about lab results. Not showing anything but a letter was sent out. Please advise.

## 2019-01-11 NOTE — Progress Notes (Signed)
Lab results letter.

## 2019-01-14 ENCOUNTER — Other Ambulatory Visit: Payer: Managed Care, Other (non HMO)

## 2019-01-14 ENCOUNTER — Other Ambulatory Visit: Payer: Self-pay

## 2019-01-14 DIAGNOSIS — R972 Elevated prostate specific antigen [PSA]: Secondary | ICD-10-CM

## 2019-01-15 LAB — PSA: Prostate Specific Ag, Serum: 2.6 ng/mL (ref 0.0–4.0)

## 2019-01-19 ENCOUNTER — Telehealth: Payer: Self-pay | Admitting: Family Medicine

## 2019-01-19 NOTE — Telephone Encounter (Signed)
Unable to reach patient phone line is busy.

## 2019-01-19 NOTE — Telephone Encounter (Signed)
-----   Message from Hollice Espy, MD sent at 01/19/2019 10:17 AM EDT ----- PSA is stable.  Great news.  Hollice Espy, MD

## 2019-01-20 NOTE — Telephone Encounter (Signed)
Called pt informed him of the information below. Pt gave verbal understanding.  

## 2019-01-29 ENCOUNTER — Other Ambulatory Visit: Payer: Self-pay | Admitting: Nurse Practitioner

## 2019-01-29 NOTE — Telephone Encounter (Signed)
Requested medication (s) are due for refill today:   Yes  Requested medication (s) are on the active medication list:   Yes  Future visit scheduled:   No   Last ordered: Already had 30 day courtesy refill.  Sees Marnee Guarneri, NP   Requested Prescriptions  Pending Prescriptions Disp Refills   rosuvastatin (CRESTOR) 20 MG tablet [Pharmacy Med Name: ROSUVASTATIN CALCIUM 20 MG TAB] 30 tablet 0    Sig: Take 1 tablet (20 mg total) by mouth daily.     Cardiovascular:  Antilipid - Statins Failed - 01/29/2019  3:12 PM      Failed - HDL in normal range and within 360 days    HDL  Date Value Ref Range Status  06/05/2018 36 (L) >39 mg/dL Final         Failed - Triglycerides in normal range and within 360 days    Triglycerides Piccolo,Waived  Date Value Ref Range Status  01/08/2019 324 (H) <150 mg/dL Final    Comment:                            Normal                   <150                         Borderline High     150 - 199                         High                200 - 499                         Very High                >499          Passed - Total Cholesterol in normal range and within 360 days    Cholesterol Piccolo, Waived  Date Value Ref Range Status  01/08/2019 143 <200 mg/dL Final    Comment:                            Desirable                <200                         Borderline High      200- 239                         High                     >239          Passed - LDL in normal range and within 360 days    LDL Calculated  Date Value Ref Range Status  06/05/2018 53 0 - 99 mg/dL Final         Passed - Patient is not pregnant      Passed - Valid encounter within last 12 months    Recent Outpatient Visits          3 weeks ago Type 2 diabetes with nephropathy [facility])   [facility] [place], Gearhart T,  NP   7 months ago Annual physical exam   [facility] [place], Henrine Screws T, NP   1 year ago Type 2 diabetes mellitus without  complication, without long-term current use of insulin ([place])   [place] Kathrine Haddock, NP   1 year ago Annual physical exam   [facility] Kathrine Haddock, NP   2 years ago Benign hypertension with chronic kidney disease   [facility] Kathrine Haddock, NP      Future Appointments            In 5 months Cannady, Barbaraann Faster, NP [employer], [place]   In 5 months Hollice Espy, MD [place]

## 2019-02-01 ENCOUNTER — Other Ambulatory Visit: Payer: Self-pay

## 2019-02-01 ENCOUNTER — Other Ambulatory Visit: Payer: Self-pay | Admitting: Unknown Physician Specialty

## 2019-02-01 ENCOUNTER — Other Ambulatory Visit: Payer: Self-pay | Admitting: Nurse Practitioner

## 2019-02-01 MED ORDER — AMLODIPINE BESYLATE 10 MG PO TABS: 10.0000 mg | ORAL_TABLET | Freq: Every day | ORAL | 0 refills | Status: DC

## 2019-02-01 NOTE — Telephone Encounter (Signed)
Request for refill on Amlodipine.

## 2019-04-20 ENCOUNTER — Other Ambulatory Visit: Payer: Self-pay | Admitting: Unknown Physician Specialty

## 2019-06-03 ENCOUNTER — Other Ambulatory Visit: Payer: Self-pay | Admitting: Nurse Practitioner

## 2019-07-05 ENCOUNTER — Other Ambulatory Visit: Payer: Self-pay | Admitting: Nurse Practitioner

## 2019-07-14 ENCOUNTER — Other Ambulatory Visit: Payer: Self-pay

## 2019-07-14 DIAGNOSIS — R972 Elevated prostate specific antigen [PSA]: Secondary | ICD-10-CM

## 2019-07-15 ENCOUNTER — Other Ambulatory Visit: Payer: Managed Care, Other (non HMO)

## 2019-07-15 ENCOUNTER — Other Ambulatory Visit: Payer: Self-pay

## 2019-07-15 DIAGNOSIS — R972 Elevated prostate specific antigen [PSA]: Secondary | ICD-10-CM

## 2019-07-16 ENCOUNTER — Ambulatory Visit (INDEPENDENT_AMBULATORY_CARE_PROVIDER_SITE_OTHER): Payer: Managed Care, Other (non HMO) | Admitting: Nurse Practitioner

## 2019-07-16 ENCOUNTER — Ambulatory Visit: Payer: Managed Care, Other (non HMO) | Admitting: Nurse Practitioner

## 2019-07-16 ENCOUNTER — Encounter: Payer: Self-pay | Admitting: Nurse Practitioner

## 2019-07-16 DIAGNOSIS — E785 Hyperlipidemia, unspecified: Secondary | ICD-10-CM

## 2019-07-16 DIAGNOSIS — N1832 Chronic kidney disease, stage 3b: Secondary | ICD-10-CM | POA: Diagnosis not present

## 2019-07-16 DIAGNOSIS — E1169 Type 2 diabetes mellitus with other specified complication: Secondary | ICD-10-CM

## 2019-07-16 DIAGNOSIS — I129 Hypertensive chronic kidney disease with stage 1 through stage 4 chronic kidney disease, or unspecified chronic kidney disease: Secondary | ICD-10-CM | POA: Diagnosis not present

## 2019-07-16 DIAGNOSIS — E1121 Type 2 diabetes mellitus with diabetic nephropathy: Secondary | ICD-10-CM

## 2019-07-16 LAB — PSA: Prostate Specific Ag, Serum: 2 ng/mL (ref 0.0–4.0)

## 2019-07-16 NOTE — Progress Notes (Signed)
There were no vitals taken for this visit.   Subjective:    Patient ID: Fernando Frederick, male    DOB: 02-Jul-1956, 63 y.o.   MRN: [id number]  HPI: Fernando Frederick is a 63 y.o. male  Chief Complaint  Patient presents with  . Diabetes  . Hyperlipidemia  . Hypertension    . This visit was completed via telephone due to the restrictions of the COVID-19 pandemic. All issues as above were discussed and addressed but no physical exam was performed. If it was felt that the patient should be evaluated in the office, they were directed there. The patient verbally consented to this visit. Patient was unable to complete an audio/visual visit due to Lack of equipment. Due to the catastrophic nature of the COVID-19 pandemic, this visit was done through audio contact only. . Location of the patient: home . Location of the provider: home . Those involved with this call:  . Provider: Marnee Guarneri, DNP . CMA: Yvonna Alanis, CMA . Front Desk/Registration: Jill Side  . Time spent on call: 15 minutes on the phone discussing health concerns. 10 minutes total spent in review of patient's record and preparation of their chart.  . I verified patient identity using two factors (patient name and date of birth). Patient consents verbally to being seen via telemedicine visit today.    DIABETES Last A1C was 6.0% in May. Continues on Metformin 500 MG BID.  States he has lost a few pounds and has been walking the track.   Hypoglycemic episodes:no Polydipsia/polyuria: no Visual disturbance: no Chest pain: no Paresthesias: no Glucose Monitoring: yes             Accucheck frequency: rarely             Fasting glucose: 99 two days ago, 90 to 110 on average               Post prandial:             Evening:             Before meals: Taking Insulin?: no             Long acting insulin:             Short acting insulin: Blood Pressure Monitoring: not checking Retinal Examination: saw Dr. Gloriann Loan three  months ago Foot Exam: Up to Date Pneumovax: Up to Date Influenza: Up to Date Aspirin: yes   HYPERTENSION / HYPERLIPIDEMIA Current Amlodipine, Lisinopril-HCTZ and Crestor. Satisfied with current treatment? yes Duration of hypertension: chronic BP monitoring frequency: not checking BP range:  BP medication side effects: no Duration of hyperlipidemia: chronic Cholesterol medication side effects: no Cholesterol supplements: none Medication compliance: good compliance Aspirin: yes Recent stressors: no Recurrent headaches: no Visual changes: no Palpitations: no Dyspnea: no Chest pain: no Lower extremity edema: no Dizzy/lightheaded: no   CHRONIC KIDNEY DISEASE Continues on Lisinopril for kidney protection.  Last urine ALB 80 and A:C 30-300, remains same today. CKD status: stable Medications renally dose: yes Previous renal evaluation: no Pneumovax:  Up to Date Influenza Vaccine:  Up to Date  Relevant past medical, surgical, family and social history reviewed and updated as indicated. Interim medical history since our last visit reviewed. Allergies and medications reviewed and updated.  Review of Systems  Constitutional: Negative for activity change, diaphoresis, fatigue and fever.  Respiratory: Negative for cough, chest tightness, shortness of breath and wheezing.   Cardiovascular: Negative for chest pain, palpitations and  leg swelling.  Gastrointestinal: Negative for abdominal distention, abdominal pain, constipation, diarrhea, nausea and vomiting.  Endocrine: Negative for cold intolerance, heat intolerance, polydipsia, polyphagia and polyuria.  Neurological: Negative for dizziness, syncope, weakness, light-headedness, numbness and headaches.  Psychiatric/Behavioral: Negative.     Per HPI unless specifically indicated above     Objective:    There were no vitals taken for this visit.  Wt Readings from Last 3 Encounters:  01/08/19 218 lb (98.9 kg)  07/15/18 222 lb  (100.7 kg)  06/05/18 222 lb 6.4 oz (100.9 kg)    Physical Exam   Unable to perform due to telephone only visit.  Results for orders placed or performed in visit on 07/15/19  PSA  Result Value Ref Range   Prostate Specific Ag, Serum 2.0 0.0 - 4.0 ng/mL      Assessment & Plan:   Problem List Items Addressed This Visit      Cardiovascular and Mediastinum   Benign hypertension with chronic kidney disease    Chronic, ongoing.  Recommend he check BP occasionally at home and document. Continue current medication regimen and adjust as needed.  Labs outpatient.        Endocrine   Hyperlipidemia associated with type 2 diabetes mellitus (HCC)    Chronic, ongoing.  Continue current medication regimen and adjust as needed.  Lipid panel and CMP outpatient.      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  Recent A1C below goal.  Continue current medication regimen, including Lisinopril for kidney protection.  Check labs outpatient.  Recommend he continue to check BS a few times a week at home.  Return in 3 months.      Relevant Orders   Bayer DCA Hb A1c Waived     Genitourinary   CKD (chronic kidney disease), stage III    Chronic, ongoing with slight decline noted on recent labs.  Continue Lisinopril for kidney protection.  CMP outpatient.  Consider nephrology referral if continued decline noted.         I discussed the assessment and treatment plan with the patient. The patient was provided an opportunity to ask questions and all were answered. The patient agreed with the plan and demonstrated an understanding of the instructions.   The patient was advised to call back or seek an in-person evaluation if the symptoms worsen or if the condition fails to improve as anticipated.   I provided 15 minutes of time during this encounter.  Follow up plan: Return in about 3 months (around  10/16/2019) for T2DM, HTN/HLD, CKD.

## 2019-07-16 NOTE — Assessment & Plan Note (Signed)
Chronic, ongoing.  Recommend he check BP occasionally at home and document. Continue current medication regimen and adjust as needed.  Labs outpatient. 

## 2019-07-16 NOTE — Assessment & Plan Note (Signed)
Chronic, ongoing.  Recent A1C below goal.  Continue current medication regimen, including Lisinopril for kidney protection.  Check labs outpatient.  Recommend he continue to check BS a few times a week at home.  Return in 3 months.

## 2019-07-16 NOTE — Patient Instructions (Signed)
Carbohydrate Counting for Diabetes Mellitus, Adult  Carbohydrate counting is a method of keeping track of how many carbohydrates you eat. Eating carbohydrates naturally increases the amount of sugar (glucose) in the blood. Counting how many carbohydrates you eat helps keep your blood glucose within normal limits, which helps you manage your diabetes (diabetes mellitus). It is important to know how many carbohydrates you can safely have in each meal. This is different for every person. A diet and nutrition specialist (registered dietitian) can help you make a meal plan and calculate how many carbohydrates you should have at each meal and snack. Carbohydrates are found in the following foods:  Grains, such as breads and cereals.  Dried beans and soy products.  Starchy vegetables, such as potatoes, peas, and corn.  Fruit and fruit juices.  Milk and yogurt.  Sweets and snack foods, such as cake, cookies, candy, chips, and soft drinks. How do I count carbohydrates? There are two ways to count carbohydrates in food. You can use either of the methods or a combination of both. Reading "Nutrition Facts" on packaged food The "Nutrition Facts" list is included on the labels of almost all packaged foods and beverages in the [place] It includes:  The serving size.  Information about nutrients in each serving, including the grams (g) of carbohydrate per serving. To use the "Nutrition Facts":  Decide how many servings you will have.  Multiply the number of servings by the number of carbohydrates per serving.  The resulting number is the total amount of carbohydrates that you will be having. Learning standard serving sizes of other foods When you eat carbohydrate foods that are not packaged or do not include "Nutrition Facts" on the label, you need to measure the servings in order to count the amount of carbohydrates:  Measure the foods that you will eat with a food scale or measuring cup, if needed.   Decide how many standard-size servings you will eat.  Multiply the number of servings by 15. Most carbohydrate-rich foods have about 15 g of carbohydrates per serving. ? For example, if you eat 8 oz (170 g) of strawberries, you will have eaten 2 servings and 30 g of carbohydrates (2 servings x 15 g = 30 g).  For foods that have more than one food mixed, such as soups and casseroles, you must count the carbohydrates in each food that is included. The following list contains standard serving sizes of common carbohydrate-rich foods. Each of these servings has about 15 g of carbohydrates:   hamburger bun or  English muffin.   oz (15 mL) syrup.   oz (14 g) jelly.  1 slice of bread.  1 six-inch tortilla.  3 oz (85 g) cooked rice or pasta.  4 oz (113 g) cooked dried beans.  4 oz (113 g) starchy vegetable, such as peas, corn, or potatoes.  4 oz (113 g) hot cereal.  4 oz (113 g) mashed potatoes or  of a large baked potato.  4 oz (113 g) canned or frozen fruit.  4 oz (120 mL) fruit juice.  4-6 crackers.  6 chicken nuggets.  6 oz (170 g) unsweetened dry cereal.  6 oz (170 g) plain fat-free yogurt or yogurt sweetened with artificial sweeteners.  8 oz (240 mL) milk.  8 oz (170 g) fresh fruit or one small piece of fruit.  24 oz (680 g) popped popcorn. Example of carbohydrate counting Sample meal  3 oz (85 g) chicken breast.  6 oz (170 g)   brown rice.  4 oz (113 g) corn.  8 oz (240 mL) milk.  8 oz (170 g) strawberries with sugar-free whipped topping. Carbohydrate calculation 1. Identify the foods that contain carbohydrates: ? Rice. ? Corn. ? Milk. ? Strawberries. 2. Calculate how many servings you have of each food: ? 2 servings rice. ? 1 serving corn. ? 1 serving milk. ? 1 serving strawberries. 3. Multiply each number of servings by 15 g: ? 2 servings rice x 15 g = 30 g. ? 1 serving corn x 15 g = 15 g. ? 1 serving milk x 15 g = 15 g. ? 1 serving  strawberries x 15 g = 15 g. 4. Add together all of the amounts to find the total grams of carbohydrates eaten: ? 30 g + 15 g + 15 g + 15 g = 75 g of carbohydrates total. Summary  Carbohydrate counting is a method of keeping track of how many carbohydrates you eat.  Eating carbohydrates naturally increases the amount of sugar (glucose) in the blood.  Counting how many carbohydrates you eat helps keep your blood glucose within normal limits, which helps you manage your diabetes.  A diet and nutrition specialist (registered dietitian) can help you make a meal plan and calculate how many carbohydrates you should have at each meal and snack. This information is not intended to replace advice given to you by your health care provider. Make sure you discuss any questions you have with your health care provider. Document Released: 08/12/2005 Document Revised: 03/06/2017 Document Reviewed: 01/24/2016 Elsevier Patient Education  2020 Elsevier Inc.  

## 2019-07-16 NOTE — Assessment & Plan Note (Signed)
Chronic, ongoing.  Continue current medication regimen and adjust as needed.  Lipid panel and CMP outpatient. 

## 2019-07-16 NOTE — Assessment & Plan Note (Signed)
Chronic, ongoing with slight decline noted on recent labs.  Continue Lisinopril for kidney protection.  CMP outpatient.  Consider nephrology referral if continued decline noted.

## 2019-07-21 ENCOUNTER — Encounter: Payer: Self-pay | Admitting: Urology

## 2019-07-21 ENCOUNTER — Ambulatory Visit: Payer: Managed Care, Other (non HMO) | Admitting: Urology

## 2019-07-21 ENCOUNTER — Other Ambulatory Visit: Payer: Self-pay

## 2019-07-21 VITALS — BP 154/77 | HR 76 | Ht 70.0 in | Wt 218.0 lb

## 2019-07-21 DIAGNOSIS — N138 Other obstructive and reflux uropathy: Secondary | ICD-10-CM

## 2019-07-21 DIAGNOSIS — N401 Enlarged prostate with lower urinary tract symptoms: Secondary | ICD-10-CM | POA: Diagnosis not present

## 2019-07-21 DIAGNOSIS — R972 Elevated prostate specific antigen [PSA]: Secondary | ICD-10-CM | POA: Diagnosis not present

## 2019-07-21 LAB — BLADDER SCAN AMB NON-IMAGING

## 2019-07-21 NOTE — Progress Notes (Signed)
07/21/2019 3:33 PM   Fernando Frederick [birth year] [id number]  Referring provider: Kathrine Haddock, NP [address],  [place] [address]  Chief Complaint  Patient presents with  . Benign Prostatic Hypertrophy    HPI: 63 year old male with a personal history of BPH and fluctuating PSA who returns today for routine annual follow-up.  He was last seen on 06/2018.  In terms of BPH symptoms, he feels like he is very well controlled on finasteride only.  He did not tolerate Flomax in the past.  IPSS as below.  His only complaint today is getting up 2 times at night to void but he is getting used to this.  He has no severe daytime symptoms and feels like his stream is good.  He is emptying well.  No gross hematuria or infections this year.  He also has a personal history of fluctuating PSA.  His PSA was as high as 4.1 in 2018 but has since trended downward since initiation of finasteride appropriately.  His PSA today is 2.0 which is stable from last year.  IPSS    Row Name 07/21/19 1000         International Prostate Symptom Score   How often have you had the sensation of not emptying your bladder?  Less than half the time     How often have you had to urinate less than every two hours?  Not at All     How often have you found you stopped and started again several times when you urinated?  Not at All     How often have you found it difficult to postpone urination?  Not at All     How often have you had a weak urinary stream?  Not at All     How often have you had to strain to start urination?  Not at All     How many times did you typically get up at night to urinate?  3 Times     Total IPSS Score  5       Quality of Life due to urinary symptoms   If you were to spend the rest of your life with your urinary condition just the way it is now how would you feel about that?  Mostly Satisfied        Score:  1-7 Mild 8-19 Moderate 20-35 Severe  PMH: Past Medical History:  Diagnosis Date   . Benign hypertension with chronic kidney disease 04/24/2015  . Chronic kidney disease   . CKD (chronic kidney disease), stage III 04/24/2015  . Diabetes ([place]) 04/24/2015  . Diabetes mellitus without complication ([place])   . Gout 04/24/2015  . Hyperlipidemia   . Hypertension     Surgical History: Past Surgical History:  Procedure Laterality Date  . partial thumb amputation      Home Medications:  Allergies as of 07/21/2019   No Known Allergies     Medication List       Accurate as of July 21, 2019  3:33 PM. If you have any questions, ask your nurse or doctor.        amLODipine 10 MG tablet Commonly known as: NORVASC Take 1 tablet (10 mg total) by mouth daily.   aspirin 81 MG tablet Take 81 mg by mouth daily.   finasteride 5 MG tablet Commonly known as: PROSCAR Take 1 tablet (5 mg total) by mouth daily.   Fish Oil 1000 MG Caps Take 1,000 mg by mouth daily.  lisinopril-hydrochlorothiazide 20-12.5 MG tablet Commonly known as: ZESTORETIC Take 1 tablet by mouth daily.   metFORMIN 500 MG tablet Commonly known as: GLUCOPHAGE Take 1 tablet (500 mg total) by mouth 2 (two) times daily with a meal.   multivitamin tablet Take 1 tablet by mouth daily.   rosuvastatin 20 MG tablet Commonly known as: CRESTOR Take 1 tablet (20 mg total) by mouth daily.   Vitamin D-3 25 MCG (1000 UT) Caps Take 1,000 Units by mouth daily.       Allergies: No Known Allergies  Family History: Family History  Problem Relation Age of Onset  . Cancer Mother        lung  . Hypertension Mother   . Diabetes Father   . Hypertension Father   . Kidney disease Father   . Kidney disease Brother   . Hypertension Sister   . Hypertension Sister   . Hypertension Sister     Social History:  reports that he quit smoking about 14 years ago. He has never used smokeless tobacco. He reports current alcohol use of about 6.0 standard drinks of alcohol per week. He reports that he does not use  drugs.  ROS: UROLOGY Frequent Urination?: Yes Hard to postpone urination?: No Burning/pain with urination?: No Get up at night to urinate?: Yes Leakage of urine?: No Urine stream starts and stops?: No Trouble starting stream?: No Do you have to strain to urinate?: No Blood in urine?: No Urinary tract infection?: No Sexually transmitted disease?: No Injury to kidneys or bladder?: No Painful intercourse?: No Weak stream?: No Erection problems?: No Penile pain?: No  Gastrointestinal Nausea?: No Vomiting?: No Indigestion/heartburn?: No Diarrhea?: No Constipation?: No  Constitutional Fever: No Night sweats?: No Weight loss?: No Fatigue?: No  Skin Skin rash/lesions?: No Itching?: No  Eyes Blurred vision?: No Double vision?: No  Ears/Nose/Throat Sore throat?: No Sinus problems?: No  Hematologic/Lymphatic Swollen glands?: No Easy bruising?: No  Cardiovascular Leg swelling?: No Chest pain?: No  Respiratory Cough?: No Shortness of breath?: No  Endocrine Excessive thirst?: No  Musculoskeletal Back pain?: No Joint pain?: No  Neurological Headaches?: No Dizziness?: No  Psychologic Depression?: No Anxiety?: No  Physical Exam: BP (!) 154/77   Pulse 76   Ht 5\' 10"  (1.778 m)   Wt 218 lb (98.9 kg)   BMI 31.28 kg/m   Constitutional:  Alert and oriented, No acute distress. HEENT: [place] AT, moist mucus membranes.  Trachea midline, no masses. Cardiovascular: No clubbing, cyanosis, or edema. Respiratory: Normal respiratory effort, no increased work of breathing. GI: Abdomen is soft, nontender, nondistended, no abdominal masses Rectal: Normal sphincter tone.  40 cc prostate with rubbery lateral lobes, no nodules. Skin: No rashes, bruises or suspicious lesions. Neurologic: Grossly intact, no focal deficits, moving all 4 extremities. Psychiatric: Normal mood and affect.  Laboratory Data: Lab Results  Component Value Date   WBC 5.5 05/02/2017   HGB 13.0  05/02/2017   HCT 39.3 05/02/2017   MCV 80 05/02/2017   PLT 307 05/02/2017    Lab Results  Component Value Date   CREATININE 1.67 (H) 01/08/2019    PSA as above    Pertinent Imaging: Results for orders placed or performed in visit on 07/21/19  Bladder Scan (Post Void Residual) in office  Result Value Ref Range   Scan Result 29ml     Assessment & Plan:    1. Elevated PSA Personal history of fluctuating/elevated PSA  PSA has come down nicely on finasteride and is remained  stably low which is appropriate.  Rectal exam is also fairly benign.  We will continue to follow him with routine annual PSA screening and rectal exams at least until age 67.  He is agreeable this plan.  2. Benign prostatic hyperplasia with urinary obstruction Symptoms stable as above with adequate bladder emptying on finasteride only  Not interested in surgical intervention  - Bladder Scan (Post Void Residual) in office   Return in about 1 year (around 07/20/2020) for sam for IPSS, PVR, DRE, PSA.  Hollice Espy, MD  [facility] [address] [place], [place] [address] [phone]

## 2019-07-30 ENCOUNTER — Other Ambulatory Visit: Payer: Self-pay

## 2019-07-30 ENCOUNTER — Other Ambulatory Visit: Payer: Managed Care, Other (non HMO)

## 2019-07-30 DIAGNOSIS — E1121 Type 2 diabetes mellitus with diabetic nephropathy: Secondary | ICD-10-CM

## 2019-07-30 DIAGNOSIS — E1169 Type 2 diabetes mellitus with other specified complication: Secondary | ICD-10-CM

## 2019-07-30 LAB — LIPID PANEL PICCOLO, WAIVED
Chol/HDL Ratio Piccolo,Waive: 3.3 mg/dL
Cholesterol Piccolo, Waived: 147 mg/dL (ref <200)
HDL Chol Piccolo, Waived: 44 mg/dL — ABNORMAL LOW (ref >59)
LDL Chol Calc Piccolo Waived: 65 mg/dL (ref <100)
Triglycerides Piccolo,Waived: 189 mg/dL — ABNORMAL HIGH (ref <150)
VLDL Chol Calc Piccolo,Waive: 38 mg/dL — ABNORMAL HIGH (ref <30)

## 2019-07-30 LAB — BAYER DCA HB A1C WAIVED: HB A1C (BAYER DCA - WAIVED): 5.9 % (ref <7.0)

## 2019-07-31 LAB — COMPREHENSIVE METABOLIC PANEL
ALT: 33 IU/L (ref 0–44)
AST: 37 IU/L (ref 0–40)
Albumin/Globulin Ratio: 1.6 (ref 1.2–2.2)
Albumin: 4.7 g/dL (ref 3.8–4.8)
Alkaline Phosphatase: 72 IU/L (ref 39–117)
BUN/Creatinine Ratio: 10 (ref 10–24)
BUN: 16 mg/dL (ref 8–27)
Bilirubin Total: 0.3 mg/dL (ref 0.0–1.2)
CO2: 23 mmol/L (ref 20–29)
Calcium: 9.7 mg/dL (ref 8.6–10.2)
Chloride: 104 mmol/L (ref 96–106)
Creatinine, Ser: 1.54 mg/dL — ABNORMAL HIGH (ref 0.76–1.27)
GFR calc Af Amer: 54 mL/min/{1.73_m2} — ABNORMAL LOW (ref >59)
GFR calc non Af Amer: 47 mL/min/{1.73_m2} — ABNORMAL LOW (ref >59)
Globulin, Total: 2.9 g/dL (ref 1.5–4.5)
Glucose: 97 mg/dL (ref 65–99)
Potassium: 4.4 mmol/L (ref 3.5–5.2)
Sodium: 142 mmol/L (ref 134–144)
Total Protein: 7.6 g/dL (ref 6.0–8.5)

## 2019-08-09 ENCOUNTER — Other Ambulatory Visit: Payer: Self-pay | Admitting: Nurse Practitioner

## 2019-09-06 ENCOUNTER — Other Ambulatory Visit: Payer: Self-pay | Admitting: Nurse Practitioner

## 2019-10-06 ENCOUNTER — Other Ambulatory Visit: Payer: Self-pay | Admitting: Physician Assistant

## 2019-10-06 ENCOUNTER — Other Ambulatory Visit: Payer: Self-pay | Admitting: Unknown Physician Specialty

## 2019-10-23 ENCOUNTER — Encounter: Payer: Self-pay | Admitting: Nurse Practitioner

## 2019-10-29 ENCOUNTER — Encounter: Payer: Self-pay | Admitting: Nurse Practitioner

## 2019-10-29 ENCOUNTER — Ambulatory Visit (INDEPENDENT_AMBULATORY_CARE_PROVIDER_SITE_OTHER): Payer: Managed Care, Other (non HMO) | Admitting: Nurse Practitioner

## 2019-10-29 DIAGNOSIS — E1159 Type 2 diabetes mellitus with other circulatory complications: Secondary | ICD-10-CM

## 2019-10-29 DIAGNOSIS — N1832 Chronic kidney disease, stage 3b: Secondary | ICD-10-CM | POA: Diagnosis not present

## 2019-10-29 DIAGNOSIS — E1169 Type 2 diabetes mellitus with other specified complication: Secondary | ICD-10-CM

## 2019-10-29 DIAGNOSIS — E785 Hyperlipidemia, unspecified: Secondary | ICD-10-CM

## 2019-10-29 DIAGNOSIS — E1121 Type 2 diabetes mellitus with diabetic nephropathy: Secondary | ICD-10-CM

## 2019-10-29 DIAGNOSIS — E669 Obesity, unspecified: Secondary | ICD-10-CM

## 2019-10-29 DIAGNOSIS — I10 Essential (primary) hypertension: Secondary | ICD-10-CM

## 2019-10-29 DIAGNOSIS — E559 Vitamin D deficiency, unspecified: Secondary | ICD-10-CM | POA: Insufficient documentation

## 2019-10-29 NOTE — Assessment & Plan Note (Signed)
Recommend continued focus on healthy diet choices and regular physical activity (30 minutes 5 days a week).  Praised for continued exercise focus.

## 2019-10-29 NOTE — Assessment & Plan Note (Signed)
Chronic, ongoing.  Continue current medication regimen and adjust as needed.  Lipid panel and CMP outpatient.

## 2019-10-29 NOTE — Progress Notes (Signed)
There were no vitals taken for this visit.   Subjective:    Patient ID: Fernando Frederick, male    DOB: 1956/04/13, 64 y.o.   MRN: [id number]  HPI: Fernando Frederick is a 64 y.o. male  Chief Complaint  Patient presents with  . Diabetes    pt states he has an eye exam 11/09/19  . Hyperlipidemia  . Hypertension    . This visit was completed via telephone due to the restrictions of the COVID-19 pandemic. All issues as above were discussed and addressed but no physical exam was performed. If it was felt that the patient should be evaluated in the office, they were directed there. The patient verbally consented to this visit. Patient was unable to complete an audio/visual visit due to Lack of equipment. Due to the catastrophic nature of the COVID-19 pandemic, this visit was done through audio contact only. . Location of the patient: home . Location of the provider: home . Those involved with this call:  . Provider: Marnee Guarneri, DNP . CMA: Yvonna Alanis, CMA . Front Desk/Registration: Jill Side  . Time spent on call: 15 minutes on the phone discussing health concerns. 10 minutes total spent in review of patient's record and preparation of their chart.  . I verified patient identity using two factors (patient name and date of birth). Patient consents verbally to being seen via telemedicine visit today.    DIABETES Last A1C was 5.9% in December. Continues on Metformin 500 MG BID.  Continues focus on exercise. Hypoglycemic episodes:no Polydipsia/polyuria: no Visual disturbance: no Chest pain: no Paresthesias: no Glucose Monitoring: yes             Accucheck frequency: rarely             Fasting glucose: 88 this afternoon, 90 to 110 on average               Post prandial:             Evening:             Before meals: Taking Insulin?: no             Long acting insulin:             Short acting insulin: Blood Pressure Monitoring: not checking Retinal Examination: Up To  Date Foot Exam: Up to Date Pneumovax: Up to Date Influenza: Up to Date Aspirin: yes   HYPERTENSION / HYPERLIPIDEMIA Current Amlodipine, Lisinopril-HCTZ and Crestor. Satisfied with current treatment? yes Duration of hypertension: chronic BP monitoring frequency: not checking BP range:  BP medication side effects: no Duration of hyperlipidemia: chronic Cholesterol medication side effects: no Cholesterol supplements: none Medication compliance: good compliance Aspirin: yes Recent stressors: no Recurrent headaches: no Visual changes: no Palpitations: no Dyspnea: no Chest pain: no Lower extremity edema: no Dizzy/lightheaded: no   CHRONIC KIDNEY DISEASE Continues on Lisinopril for kidney protection.  Has history of low vitamin D level and takes daily supplement for this.  CKD status: stable Medications renally dose: yes Previous renal evaluation: no Pneumovax:  Up to Date Influenza Vaccine:  Up to Date  Relevant past medical, surgical, family and social history reviewed and updated as indicated. Interim medical history since our last visit reviewed. Allergies and medications reviewed and updated.  Review of Systems  Constitutional: Negative for activity change, diaphoresis, fatigue and fever.  Respiratory: Negative for cough, chest tightness, shortness of breath and wheezing.   Cardiovascular: Negative for chest pain, palpitations and  leg swelling.  Gastrointestinal: Negative.   Endocrine: Negative for cold intolerance, heat intolerance, polydipsia, polyphagia and polyuria.  Neurological: Negative.   Psychiatric/Behavioral: Negative.     Per HPI unless specifically indicated above     Objective:    There were no vitals taken for this visit.  Wt Readings from Last 3 Encounters:  07/21/19 218 lb (98.9 kg)  01/08/19 218 lb (98.9 kg)  07/15/18 222 lb (100.7 kg)    Physical Exam   Unable to perform due to telephone only visit.  Results for orders placed or  performed in visit on 07/30/19  30m Lipid Panel  Result Value Ref Range   Cholesterol Piccolo, Waived 147 <200 mg/dL   HDL Chol Piccolo, Waived 44 (L) >59 mg/dL   Triglycerides Piccolo,Waived 189 (H) <150 mg/dL   Chol/HDL Ratio Piccolo,Waive 3.3 mg/dL   LDL Chol Calc Piccolo Waived 65 <100 mg/dL   VLDL Chol Calc Piccolo,Waive 38 (H) <30 mg/dL  Bayer DCA Hb A1c Waived  Result Value Ref Range   HB A1C (BAYER DCA - WAIVED) 5.9 <7.0 %  Comprehensive metabolic panel  Result Value Ref Range   Glucose 97 65 - 99 mg/dL   BUN 16 8 - 27 mg/dL   Creatinine, Ser 1.54 (H) 0.76 - 1.27 mg/dL   GFR calc non Af Amer 47 (L) >59 mL/min/1.73   GFR calc Af Amer 54 (L) >59 mL/min/1.73   BUN/Creatinine Ratio 10 10 - 24   Sodium 142 134 - 144 mmol/L   Potassium 4.4 3.5 - 5.2 mmol/L   Chloride 104 96 - 106 mmol/L   CO2 23 20 - 29 mmol/L   Calcium 9.7 8.6 - 10.2 mg/dL   Total Protein 7.6 6.0 - 8.5 g/dL   Albumin 4.7 3.8 - 4.8 g/dL   Globulin, Total 2.9 1.5 - 4.5 g/dL   Albumin/Globulin Ratio 1.6 1.2 - 2.2   Bilirubin Total 0.3 0.0 - 1.2 mg/dL   Alkaline Phosphatase 72 39 - 117 IU/L   AST 37 0 - 40 IU/L   ALT 33 0 - 44 IU/L      Assessment & Plan:   Problem List Items Addressed This Visit      Cardiovascular and Mediastinum   Hypertension associated with diabetes (HCC)    Chronic, ongoing.  Recommend he check BP occasionally at home and document. Continue current medication regimen and adjust as needed.  Labs outpatient.      Relevant Orders   Bayer DCA Hb A1c Waived   Microalbumin, Urine Waived     Endocrine   Hyperlipidemia associated with type 2 diabetes mellitus (HCC)    Chronic, ongoing.  Continue current medication regimen and adjust as needed.  Lipid panel and CMP outpatient.      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  Recent A1C below goal at 5.9%.   Continue current medication regimen, including Lisinopril for kidney protection.  Check labs outpatient.  Recommend he continue to check BS a few times a week at home.  Return in 3 months.  May consider reduction of Metformin to once a day if continued good control and weight loss.      Relevant Orders   Bayer DCA Hb A1c Waived   Microalbumin, Urine Waived     Genitourinary   CKD (chronic kidney disease), stage  III    Chronic, ongoing. Continue Lisinopril for kidney protection.  BMP outpatient.  Consider nephrology referral if continued decline noted.        Other   Obesity (BMI 30-39.9)    Recommend continued focus on healthy diet choices and regular physical activity (30 minutes 5 days a week).  Praised for continued exercise focus.       Vitamin D deficiency    Continue supplement at home and check Vit D level outpatient.      Relevant Orders   VITAMIN D 25 Hydroxy (Vit-D Deficiency, Fractures)      I discussed the assessment and treatment plan with the patient. The patient was provided an opportunity to ask questions and all were answered. The patient agreed with the plan and demonstrated an understanding of the instructions.   The patient was advised to call back or seek an in-person evaluation if the symptoms worsen or if the condition fails to improve as anticipated.   I provided 15 minutes of time during this encounter.  Follow up plan: Return in about 3 months (around 01/29/2020) for T2DM, HTN/HLD.

## 2019-10-29 NOTE — Assessment & Plan Note (Signed)
Chronic, ongoing.  Recommend he check BP occasionally at home and document. Continue current medication regimen and adjust as needed.  Labs outpatient.

## 2019-10-29 NOTE — Assessment & Plan Note (Signed)
Continue supplement at home and check Vit D level outpatient.

## 2019-10-29 NOTE — Patient Instructions (Signed)
Carbohydrate Counting for Diabetes Mellitus, Adult  Carbohydrate counting is a method of keeping track of how many carbohydrates you eat. Eating carbohydrates naturally increases the amount of sugar (glucose) in the blood. Counting how many carbohydrates you eat helps keep your blood glucose within normal limits, which helps you manage your diabetes (diabetes mellitus). It is important to know how many carbohydrates you can safely have in each meal. This is different for every person. A diet and nutrition specialist (registered dietitian) can help you make a meal plan and calculate how many carbohydrates you should have at each meal and snack. Carbohydrates are found in the following foods:  Grains, such as breads and cereals.  Dried beans and soy products.  Starchy vegetables, such as potatoes, peas, and corn.  Fruit and fruit juices.  Milk and yogurt.  Sweets and snack foods, such as cake, cookies, candy, chips, and soft drinks. How do I count carbohydrates? There are two ways to count carbohydrates in food. You can use either of the methods or a combination of both. Reading "Nutrition Facts" on packaged food The "Nutrition Facts" list is included on the labels of almost all packaged foods and beverages in the [place] It includes:  The serving size.  Information about nutrients in each serving, including the grams (g) of carbohydrate per serving. To use the "Nutrition Facts":  Decide how many servings you will have.  Multiply the number of servings by the number of carbohydrates per serving.  The resulting number is the total amount of carbohydrates that you will be having. Learning standard serving sizes of other foods When you eat carbohydrate foods that are not packaged or do not include "Nutrition Facts" on the label, you need to measure the servings in order to count the amount of carbohydrates:  Measure the foods that you will eat with a food scale or measuring cup, if  needed.  Decide how many standard-size servings you will eat.  Multiply the number of servings by 15. Most carbohydrate-rich foods have about 15 g of carbohydrates per serving. ? For example, if you eat 8 oz (170 g) of strawberries, you will have eaten 2 servings and 30 g of carbohydrates (2 servings x 15 g = 30 g).  For foods that have more than one food mixed, such as soups and casseroles, you must count the carbohydrates in each food that is included. The following list contains standard serving sizes of common carbohydrate-rich foods. Each of these servings has about 15 g of carbohydrates:   hamburger bun or  English muffin.   oz (15 mL) syrup.   oz (14 g) jelly.  1 slice of bread.  1 six-inch tortilla.  3 oz (85 g) cooked rice or pasta.  4 oz (113 g) cooked dried beans.  4 oz (113 g) starchy vegetable, such as peas, corn, or potatoes.  4 oz (113 g) hot cereal.  4 oz (113 g) mashed potatoes or  of a large baked potato.  4 oz (113 g) canned or frozen fruit.  4 oz (120 mL) fruit juice.  4-6 crackers.  6 chicken nuggets.  6 oz (170 g) unsweetened dry cereal.  6 oz (170 g) plain fat-free yogurt or yogurt sweetened with artificial sweeteners.  8 oz (240 mL) milk.  8 oz (170 g) fresh fruit or one small piece of fruit.  24 oz (680 g) popped popcorn. Example of carbohydrate counting Sample meal  3 oz (85 g) chicken breast.  6 oz (170 g)   brown rice.  4 oz (113 g) corn.  8 oz (240 mL) milk.  8 oz (170 g) strawberries with sugar-free whipped topping. Carbohydrate calculation 1. Identify the foods that contain carbohydrates: ? Rice. ? Corn. ? Milk. ? Strawberries. 2. Calculate how many servings you have of each food: ? 2 servings rice. ? 1 serving corn. ? 1 serving milk. ? 1 serving strawberries. 3. Multiply each number of servings by 15 g: ? 2 servings rice x 15 g = 30 g. ? 1 serving corn x 15 g = 15 g. ? 1 serving milk x 15 g = 15 g. ? 1  serving strawberries x 15 g = 15 g. 4. Add together all of the amounts to find the total grams of carbohydrates eaten: ? 30 g + 15 g + 15 g + 15 g = 75 g of carbohydrates total. Summary  Carbohydrate counting is a method of keeping track of how many carbohydrates you eat.  Eating carbohydrates naturally increases the amount of sugar (glucose) in the blood.  Counting how many carbohydrates you eat helps keep your blood glucose within normal limits, which helps you manage your diabetes.  A diet and nutrition specialist (registered dietitian) can help you make a meal plan and calculate how many carbohydrates you should have at each meal and snack. This information is not intended to replace advice given to you by your health care provider. Make sure you discuss any questions you have with your health care provider. Document Revised: 03/06/2017 Document Reviewed: 01/24/2016 Elsevier Patient Education  2020 Elsevier Inc.  

## 2019-10-29 NOTE — Assessment & Plan Note (Signed)
Chronic, ongoing. Continue Lisinopril for kidney protection.  BMP outpatient.  Consider nephrology referral if continued decline noted.

## 2019-10-29 NOTE — Assessment & Plan Note (Signed)
Chronic, ongoing.  Recent A1C below goal at 5.9%.  Continue current medication regimen, including Lisinopril for kidney protection.  Check labs outpatient.  Recommend he continue to check BS a few times a week at home.  Return in 3 months.  May consider reduction of Metformin to once a day if continued good control and weight loss.

## 2019-11-02 ENCOUNTER — Encounter: Payer: Self-pay | Admitting: Nurse Practitioner

## 2019-11-02 ENCOUNTER — Telehealth: Payer: Self-pay | Admitting: Nurse Practitioner

## 2019-11-02 NOTE — Telephone Encounter (Signed)
lvm sent letter to make this 3 month follow-up NEEDS outpatient lab visit scheduled

## 2019-11-02 NOTE — Telephone Encounter (Signed)
-----   Message from Venita Lick, NP sent at 10/29/2019  3:31 PM EST ----- 3 month follow-up + NEEDS outpatient lab visit scheduled

## 2019-11-03 ENCOUNTER — Other Ambulatory Visit: Payer: Managed Care, Other (non HMO)

## 2019-11-03 ENCOUNTER — Other Ambulatory Visit: Payer: Self-pay

## 2019-11-03 DIAGNOSIS — E1121 Type 2 diabetes mellitus with diabetic nephropathy: Secondary | ICD-10-CM

## 2019-11-03 DIAGNOSIS — E1169 Type 2 diabetes mellitus with other specified complication: Secondary | ICD-10-CM

## 2019-11-03 DIAGNOSIS — E1159 Type 2 diabetes mellitus with other circulatory complications: Secondary | ICD-10-CM

## 2019-11-03 DIAGNOSIS — E785 Hyperlipidemia, unspecified: Secondary | ICD-10-CM

## 2019-11-03 DIAGNOSIS — E559 Vitamin D deficiency, unspecified: Secondary | ICD-10-CM

## 2019-11-03 LAB — BAYER DCA HB A1C WAIVED: HB A1C (BAYER DCA - WAIVED): 5.9 % (ref <7.0)

## 2019-11-03 LAB — MICROALBUMIN, URINE WAIVED
Creatinine, Urine Waived: 100 mg/dL (ref 10–300)
Microalb, Ur Waived: 10 mg/L (ref 0–19)
Microalb/Creat Ratio: <30 mg/g (ref <30)

## 2019-11-04 LAB — LIPID PANEL W/O CHOL/HDL RATIO
Cholesterol, Total: 131 mg/dL (ref 100–199)
HDL: 34 mg/dL — ABNORMAL LOW (ref >39)
LDL Chol Calc (NIH): 49 mg/dL (ref 0–99)
Triglycerides: 317 mg/dL — ABNORMAL HIGH (ref 0–149)
VLDL Cholesterol Cal: 48 mg/dL — ABNORMAL HIGH (ref 5–40)

## 2019-11-04 LAB — BASIC METABOLIC PANEL
BUN/Creatinine Ratio: 13 (ref 10–24)
BUN: 19 mg/dL (ref 8–27)
CO2: 23 mmol/L (ref 20–29)
Calcium: 9.6 mg/dL (ref 8.6–10.2)
Chloride: 102 mmol/L (ref 96–106)
Creatinine, Ser: 1.43 mg/dL — ABNORMAL HIGH (ref 0.76–1.27)
GFR calc Af Amer: 59 mL/min/{1.73_m2} — ABNORMAL LOW (ref >59)
GFR calc non Af Amer: 51 mL/min/{1.73_m2} — ABNORMAL LOW (ref >59)
Glucose: 92 mg/dL (ref 65–99)
Potassium: 4.6 mmol/L (ref 3.5–5.2)
Sodium: 141 mmol/L (ref 134–144)

## 2019-11-04 LAB — VITAMIN D 25 HYDROXY (VIT D DEFICIENCY, FRACTURES): Vit D, 25-Hydroxy: 45 ng/mL (ref 30.0–100.0)

## 2019-11-04 NOTE — Progress Notes (Signed)
Good morning.  Please let Fernando Frederick know his labs have returned: - A1C is 5.9%.  BRAVO!!!  Continue Metformin as ordered at this time and in future we may try reducing this.  - Urine shows no protein -- continue Lisinopril which protects kidneys - Labs show some mild kidney disease still, but no worsening.  This is good. - Vitamind D level is normal -- continue your supplement daily. - Cholesterol levels LDL is at goal, triglycerides are a little elevated.  We will continue to monitor this and if still in 300 range next visit may add a medication to specifically help bring these down. Have a wonderful day!!!  [place] job!!

## 2019-12-07 ENCOUNTER — Other Ambulatory Visit: Payer: Self-pay | Admitting: Nurse Practitioner

## 2019-12-07 NOTE — Telephone Encounter (Signed)
Requested Prescriptions  Pending Prescriptions Disp Refills  . metFORMIN (GLUCOPHAGE) 500 MG tablet [Pharmacy Med Name: METFORMIN HCL 500 MG TABLET] 180 tablet 0    Sig: Take 1 tablet (500 mg total) by mouth 2 (two) times daily with a meal.     Endocrinology:  Diabetes - Biguanides Failed - 12/07/2019  3:39 PM      Failed - Cr in normal range and within 360 days    Creatinine  Date Value Ref Range Status  10/26/2013 1.46 (H) 0.60 - 1.30 mg/dL Final   Creatinine, Ser  Date Value Ref Range Status  11/03/2019 1.43 (H) 0.76 - 1.27 mg/dL Final         Failed - eGFR in normal range and within 360 days    EGFR (African American)  Date Value Ref Range Status  10/26/2013 >60  Final   GFR calc Af Amer  Date Value Ref Range Status  11/03/2019 59 (L) >59 mL/min/1.73 Final   EGFR (Non-African Amer.)  Date Value Ref Range Status  10/26/2013 52 (L)  Final    Comment:    eGFR values <53m/min/1.73 m2 may be an indication of chronic kidney disease (CKD). Calculated eGFR is useful in patients with stable renal function. The eGFR calculation will not be reliable in acutely ill patients when serum creatinine is changing rapidly. It is not useful in  patients on dialysis. The eGFR calculation may not be applicable to patients at the low and high extremes of body sizes, pregnant women, and vegetarians.    GFR calc non Af Amer  Date Value Ref Range Status  11/03/2019 51 (L) >59 mL/min/1.73 Final         Passed - HBA1C is between 0 and 7.9 and within 180 days    Hemoglobin A1C  Date Value Ref Range Status  04/23/2016 6.4%  Final   HB A1C (BAYER DCA - WAIVED)  Date Value Ref Range Status  11/03/2019 5.9 <7.0 % Final    Comment:                                          Diabetic Adult            <7.0                                       Healthy Adult        4.3 - 5.7                                                           (DCCT/NGSP) American Diabetes Association's Summary of Glycemic  Recommendations for Adults with Diabetes: Hemoglobin A1c <7.0%. More stringent glycemic goals (A1c <6.0%) may further reduce complications at the cost of increased risk of hypoglycemia.          Passed - Valid encounter within last 6 months    Recent Outpatient Visits          1 month ago Type 2 diabetes with nephropathy ([place]   [place], [place]   4 months ago Type 2 diabetes with nephropathy ([place]  [facility] [place], [place] T, NP   11 months ago Type 2 diabetes with nephropathy ([place])   [place], Barbaraann Faster, NP   1 year ago Annual physical exam   [place], [place] T, NP   2 years ago Type 2 diabetes mellitus without complication, without long-term current use of insulin ([place])   [place] Kathrine Haddock, NP      Future Appointments            In 1 month Cannady, Barbaraann Faster, NP [facility], [place]   In 7 months Vaillanc[place], Aldona Bar, PA-C [facility]           . lisinopril-hydrochlorothiazide (ZESTORETIC) 20-12.5 MG tablet [Pharmacy Med Name: LISINOPRIL-HCTZ 20-12.5 MG TAB] 90 tablet 0    Sig: Take 1 tablet by mouth daily.     Cardiovascular:  ACEI + Diuretic Combos Failed - 12/07/2019  3:39 PM      Failed - Cr in normal range and within 180 days    Creatinine  Date Value Ref Range Status  10/26/2013 1.46 (H) 0.60 - 1.30 mg/dL Final   Creatinine, Ser  Date Value Ref Range Status  11/03/2019 1.43 (H) 0.76 - 1.27 mg/dL Final         Failed - Last BP in normal range    BP Readings from Last 1 Encounters:  07/21/19 (!) 154/77         Passed - Na in normal range and within 180 days    Sodium  Date Value Ref Range Status  11/03/2019 141 134 - 144 mmol/L Final  10/26/2013 138 136 - 145 mmol/L Final         Passed - K in normal range and within 180 days    Potassium  Date Value Ref Range Status  11/03/2019 4.6 3.5 - 5.2 mmol/L Final   10/26/2013 4.2 3.5 - 5.1 mmol/L Final         Passed - Ca in normal range and within 180 days    Calcium  Date Value Ref Range Status  11/03/2019 9.6 8.6 - 10.2 mg/dL Final   Calcium, Total  Date Value Ref Range Status  10/26/2013 9.0 8.5 - 10.1 mg/dL Final         Passed - Patient is not pregnant      Passed - Valid encounter within last 6 months    Recent Outpatient Visits          1 month ago Type 2 diabetes with nephropathy ([place])   [place], Jolene T, NP   4 months ago Type 2 diabetes with nephropathy ([place])   [place], Jolene T, NP   11 months ago Type 2 diabetes with nephropathy ([place])   [place], Barbaraann Faster, NP   1 year ago Annual physical exam   [place], Henrine Screws T, NP   2 years ago Type 2 diabetes mellitus without complication, without long-term current use of insulin ([place])   [place] Kathrine Haddock, NP      Future Appointments            In 1 month Cannady, Barbaraann Faster, NP [facility], [place]   In 7 months Vaillancourt, Aldona Bar, PA-C [facility]           . amLODipine (NORVASC) 10 MG tablet [Pharmacy Med Name: AMLODIPINE BESYLATE 10 MG TAB] 90 tablet 0    Sig:  Take 1 tablet (10 mg total) by mouth daily.     Cardiovascular:  Calcium Channel Blockers Failed - 12/07/2019  3:39 PM      Failed - Last BP in normal range    BP Readings from Last 1 Encounters:  07/21/19 (!) 154/77         Passed - Valid encounter within last 6 months    Recent Outpatient Visits          1 month ago Type 2 diabetes with nephropathy ([place])   [place], Jolene T, NP   4 months ago Type 2 diabetes with nephropathy ([place])   [place], Jolene T, NP   11 months ago Type 2 diabetes with nephropathy ([place])   [place], Barbaraann Faster, NP   1 year ago Annual physical exam   [place], Henrine Screws T, NP   2 years ago Type 2 diabetes mellitus without complication, without long-term current use of insulin ([place])   [place] Kathrine Haddock, NP      Future Appointments            In 1 month Cannady, Barbaraann Faster, NP [facility], [place]   In 7 months Debroah Loop, PA-C [place]           labs reviewed by PCP 11/04/19.  Has f/u appt. 02/02/20.

## 2020-01-05 ENCOUNTER — Other Ambulatory Visit: Payer: Self-pay | Admitting: Urology

## 2020-01-05 ENCOUNTER — Other Ambulatory Visit: Payer: Self-pay | Admitting: Nurse Practitioner

## 2020-02-02 ENCOUNTER — Other Ambulatory Visit: Payer: Self-pay

## 2020-02-02 ENCOUNTER — Encounter: Payer: Self-pay | Admitting: Nurse Practitioner

## 2020-02-02 ENCOUNTER — Ambulatory Visit: Payer: Managed Care, Other (non HMO) | Admitting: Nurse Practitioner

## 2020-02-02 VITALS — BP 128/84 | HR 75 | Temp 98.7°F | Wt 212.0 lb

## 2020-02-02 DIAGNOSIS — E1169 Type 2 diabetes mellitus with other specified complication: Secondary | ICD-10-CM

## 2020-02-02 DIAGNOSIS — E1121 Type 2 diabetes mellitus with diabetic nephropathy: Secondary | ICD-10-CM | POA: Diagnosis not present

## 2020-02-02 DIAGNOSIS — E1159 Type 2 diabetes mellitus with other circulatory complications: Secondary | ICD-10-CM | POA: Diagnosis not present

## 2020-02-02 DIAGNOSIS — E669 Obesity, unspecified: Secondary | ICD-10-CM

## 2020-02-02 DIAGNOSIS — E785 Hyperlipidemia, unspecified: Secondary | ICD-10-CM

## 2020-02-02 DIAGNOSIS — N1831 Chronic kidney disease, stage 3a: Secondary | ICD-10-CM | POA: Diagnosis not present

## 2020-02-02 DIAGNOSIS — I10 Essential (primary) hypertension: Secondary | ICD-10-CM

## 2020-02-02 LAB — BAYER DCA HB A1C WAIVED: HB A1C (BAYER DCA - WAIVED): 5.8 % (ref <7.0)

## 2020-02-02 NOTE — Progress Notes (Signed)
BP 128/84   Pulse 75   Temp 98.7 F (37.1 C) (Oral)   Wt 212 lb (96.2 kg)   SpO2 98%   BMI 30.42 kg/m    Subjective:    Patient ID: Fernando Frederick, male    DOB: 1956-02-15, 64 y.o.   MRN: [id number]  HPI: [name] is a 64 y.o. male  Chief Complaint  Patient presents with  . Diabetes  . Hypertension  . Hyperlipidemia   DIABETES Last A1C was 5.9% in March. Continues on Metformin 500 MG BID.  Continues focus on exercise. Hypoglycemic episodes:no Polydipsia/polyuria:no Visual disturbance:no Chest pain:no Paresthesias:no Glucose Monitoring:yes Accucheck frequency: rarely Fasting glucose: last check was 99 -- averaging 90-100  Post prandial: Evening: Before meals: Taking Insulin?:no Long acting insulin: Short acting insulin: Blood Pressure Monitoring:not checking Retinal Examination:Not Up To Date Foot Exam:Up to Date Pneumovax:Up to Date -- just had Covid vaccine Influenza:Up to Date Aspirin:yes  HYPERTENSION / HYPERLIPIDEMIA Current Amlodipine, Lisinopril-HCTZ and Crestor. Satisfied with current treatment?yes Duration of hypertension:chronic BP monitoring frequency:not checking BP range:  BP medication side effects:no Duration of hyperlipidemia:chronic Cholesterol medication side effects:no Cholesterol supplements: none Medication compliance:good compliance Aspirin:yes Recent stressors:no Recurrent headaches:no Visual changes:no Palpitations:no Dyspnea:no Chest pain:no Lower extremity edema:no Dizzy/lightheaded:no  CHRONIC KIDNEY DISEASE Continues on Lisinopril for kidney protection. Last CRT 1.43 and GFR 59. CKD status:stable Medications renally dose:yes Previous renal evaluation:no Pneumovax:Up to Date Influenza Vaccine:Up to Date  Relevant past medical, surgical, family and social history reviewed and updated as  indicated. Interim medical history since our last visit reviewed. Allergies and medications reviewed and updated.  Review of Systems  Constitutional: Negative for activity change, diaphoresis, fatigue and fever.  Respiratory: Negative for cough, chest tightness, shortness of breath and wheezing.   Cardiovascular: Negative for chest pain, palpitations and leg swelling.  Gastrointestinal: Negative.   Endocrine: Negative for cold intolerance, heat intolerance, polydipsia, polyphagia and polyuria.  Musculoskeletal: Negative.   Skin: Negative.   Neurological: Negative.   Psychiatric/Behavioral: Negative.     Per HPI unless specifically indicated above     Objective:    BP 128/84   Pulse 75   Temp 98.7 F (37.1 C) (Oral)   Wt 212 lb (96.2 kg)   SpO2 98%   BMI 30.42 kg/m   Wt Readings from Last 3 Encounters:  02/02/20 212 lb (96.2 kg)  07/21/19 218 lb (98.9 kg)  01/08/19 218 lb (98.9 kg)    Physical Exam Vitals and nursing note reviewed.  Constitutional:      General: He is awake. He is not in acute distress.    Appearance: He is well-developed and well-groomed. He is obese. He is not ill-appearing.  HENT:     Head: Normocephalic and atraumatic.     Right Ear: Hearing normal. No drainage.     Left Ear: Hearing normal. No drainage.  Eyes:     General: Lids are normal.        Right eye: No discharge.        Left eye: No discharge.     Conjunctiva/sclera: Conjunctivae normal.     Pupils: Pupils are equal, round, and reactive to light.  Neck:     Thyroid: No thyromegaly.     Vascular: No carotid bruit.     Trachea: Trachea normal.  Cardiovascular:     Rate and Rhythm: Normal rate and regular rhythm.     Heart sounds: Normal heart sounds, S1 normal and S2 normal. No murmur. No gallop.  Pulmonary:     Effort: Pulmonary effort is normal. No accessory muscle usage or respiratory distress.     Breath sounds: Normal breath sounds.  Abdominal:     General: Bowel sounds are  normal.     Palpations: Abdomen is soft.  Musculoskeletal:        General: Normal range of motion.     Cervical back: Normal range of motion and neck supple.     Right lower leg: No edema.     Left lower leg: No edema.  Skin:    General: Skin is warm and dry.     Capillary Refill: Capillary refill takes less than 2 seconds.  Neurological:     Mental Status: He is alert and oriented to person, place, and time.     Deep Tendon Reflexes: Reflexes are normal and symmetric.  Psychiatric:        Attention and Perception: Attention normal.        Mood and Affect: Mood normal.        Speech: Speech normal.        Behavior: Behavior normal. Behavior is cooperative.        Thought Content: Thought content normal.     Results for orders placed or performed in visit on 02/02/20  Bayer DCA Hb A1c Waived  Result Value Ref Range   HB A1C (BAYER DCA - WAIVED) 5.8 <7.0 %      Assessment & Plan:   Problem List Items Addressed This Visit      Cardiovascular and Mediastinum   Hypertension associated with diabetes ([place])    Chronic, ongoing with BP below goal today.  Recommend he check BP occasionally at home and document & focus on DASH diet. Continue current medication regimen and adjust as needed.  Return in 3 months for annual physical.        Endocrine   Hyperlipidemia associated with type 2 diabetes mellitus (HCC)    Chronic, ongoing.  Continue current medication regimen and adjust as needed.  Lipid panel at annual physical in 3 months.      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  Recent A1C below goal at 5.8% today.  Continue current medication regimen, including Lisinopril for kidney protection.  Recommend he continue to check BS a few times a week at home.  Return in 3 months for annual physical.  May consider reduction of Metformin to once a day if continued good control and weight loss.      Relevant Orders   Bayer DCA Hb A1c Waived (Completed)      Genitourinary   CKD (chronic kidney disease), stage III    Chronic, ongoing. Continue Lisinopril for kidney protection.  BMP next visit.  Consider nephrology referral if continued decline noted.        Other   Obesity (BMI 30-39.9)    BMI 30.42.  Recommended eating smaller high protein, low fat meals more frequently and exercising 30 mins a day 5 times a week with a goal of 10-15lb weight loss in the next 3 months. Patient voiced their understanding and motivation to adhere to these recommendations.           Follow up plan: Return in about 3 months (around 05/04/2020) for Annual physical.

## 2020-02-02 NOTE — Assessment & Plan Note (Signed)
Chronic, ongoing. Continue Lisinopril for kidney protection.  BMP next visit.  Consider nephrology referral if continued decline noted.

## 2020-02-02 NOTE — Patient Instructions (Signed)

## 2020-02-02 NOTE — Assessment & Plan Note (Signed)
Chronic, ongoing.  Recent A1C below goal at 5.8% today.  Continue current medication regimen, including Lisinopril for kidney protection.  Recommend he continue to check BS a few times a week at home.  Return in 3 months for annual physical.  May consider reduction of Metformin to once a day if continued good control and weight loss.

## 2020-02-02 NOTE — Assessment & Plan Note (Signed)
BMI 30.42.  Recommended eating smaller high protein, low fat meals more frequently and exercising 30 mins a day 5 times a week with a goal of 10-15lb weight loss in the next 3 months. Patient voiced their understanding and motivation to adhere to these recommendations.

## 2020-02-02 NOTE — Assessment & Plan Note (Signed)
Chronic, ongoing with BP below goal today.  Recommend he check BP occasionally at home and document & focus on DASH diet. Continue current medication regimen and adjust as needed.  Return in 3 months for annual physical.

## 2020-02-02 NOTE — Assessment & Plan Note (Signed)
Chronic, ongoing.  Continue current medication regimen and adjust as needed.  Lipid panel at annual physical in 3 months.

## 2020-02-07 ENCOUNTER — Other Ambulatory Visit: Payer: Self-pay | Admitting: Urology

## 2020-02-07 ENCOUNTER — Other Ambulatory Visit: Payer: Self-pay | Admitting: Nurse Practitioner

## 2020-03-11 ENCOUNTER — Other Ambulatory Visit: Payer: Self-pay | Admitting: Nurse Practitioner

## 2020-03-11 ENCOUNTER — Other Ambulatory Visit: Payer: Self-pay | Admitting: Urology

## 2020-03-11 NOTE — Telephone Encounter (Signed)
Requested Prescriptions  Pending Prescriptions Disp Refills  . lisinopril-hydrochlorothiazide (ZESTORETIC) 20-12.5 MG tablet [Pharmacy Med Name: LISINOPRIL-HCTZ 20-12.5 MG TAB] 90 tablet 1    Sig: Take 1 tablet by mouth daily.     Cardiovascular:  ACEI + Diuretic Combos Failed - 03/11/2020 12:07 PM      Failed - Cr in normal range and within 180 days    Creatinine  Date Value Ref Range Status  10/26/2013 1.46 (H) 0.60 - 1.30 mg/dL Final   Creatinine, Ser  Date Value Ref Range Status  11/03/2019 1.43 (H) 0.76 - 1.27 mg/dL Final         Passed - Na in normal range and within 180 days    Sodium  Date Value Ref Range Status  11/03/2019 141 134 - 144 mmol/L Final  10/26/2013 138 136 - 145 mmol/L Final         Passed - K in normal range and within 180 days    Potassium  Date Value Ref Range Status  11/03/2019 4.6 3.5 - 5.2 mmol/L Final  10/26/2013 4.2 3.5 - 5.1 mmol/L Final         Passed - Ca in normal range and within 180 days    Calcium  Date Value Ref Range Status  11/03/2019 9.6 8.6 - 10.2 mg/dL Final   Calcium, Total  Date Value Ref Range Status  10/26/2013 9.0 8.5 - 10.1 mg/dL Final         Passed - Patient is not pregnant      Passed - Last BP in normal range    BP Readings from Last 1 Encounters:  02/02/20 128/84         Passed - Valid encounter within last 6 months    Recent Outpatient Visits          1 month ago Type 2 diabetes with nephropathy ([place])   [place], Jolene T, NP   4 months ago Type 2 diabetes with nephropathy ([place])   [place], Jolene T, NP   7 months ago Type 2 diabetes with nephropathy ([place])   [place], Jolene T, NP   1 year ago Type 2 diabetes with nephropathy ([place])   [place], Barbaraann Faster, NP   1 year ago Annual physical exam   [place], Barbaraann Faster, NP      Future Appointments            In 1 month Cannady, Barbaraann Faster, NP  [facility], [place]   In 4 months Va[place]anc[place], Aldona Bar, PA-C [facility]           . amLODipine (NORVASC) 10 MG tablet [Pharmacy Med Name: AMLODIPINE BESYLATE 10 MG TAB] 90 tablet 1    Sig: Take 1 tablet (10 mg total) by mouth daily.     Cardiovascular:  Calcium Channel Blockers Passed - 03/11/2020 12:07 PM      Passed - Last BP in normal range    BP Readings from Last 1 Encounters:  02/02/20 128/84         Passed - Valid encounter within last 6 months    Recent Outpatient Visits          1 month ago Type 2 diabetes with nephropathy ([place])   [place], Jolene T, NP   4 months ago Type 2 diabetes with nephropathy ([place])   [place], Henrine Screws T, NP   7 months  ago Type 2 diabetes with nephropathy ([place])   [place], [place] T, NP   1 year ago Type 2 diabetes with nephropathy ([place])   [place], Barbaraann Faster, NP   1 year ago Annual physical exam   [place], Barbaraann Faster, NP      Future Appointments            In 1 month Cannady, Barbaraann Faster, NP [facility], [place]   In 4 months Vaillanc[place], Aldona Bar, PA-C [facility]           . rosuvastatin (CRESTOR) 20 MG tablet [Pharmacy Med Name: ROSUVASTATIN CALCIUM 20 MG TAB] 90 tablet 1    Sig: Take 1 tablet (20 mg total) by mouth daily.     Cardiovascular:  Antilipid - Statins Failed - 03/11/2020 12:07 PM      Failed - HDL in normal range and within 360 days    HDL  Date Value Ref Range Status  11/03/2019 34 (L) >39 mg/dL Final         Failed - Triglycerides in normal range and within 360 days    Triglycerides  Date Value Ref Range Status  11/03/2019 317 (H) 0 - 149 mg/dL Final   Triglycerides Piccolo,Waived  Date Value Ref Range Status  07/30/2019 189 (H) <150 mg/dL Final    Comment:                            Normal                   <150                          Borderline High     150 - 199                         High                200 - 499                         Very High                >499          Passed - Total Cholesterol in normal range and within 360 days    Cholesterol, Total  Date Value Ref Range Status  11/03/2019 131 100 - 199 mg/dL Final   Cholesterol Piccolo, Waived  Date Value Ref Range Status  07/30/2019 147 <200 mg/dL Final    Comment:                            Desirable                <200                         Borderline High      200- 239                         High                     >239          Passed - LDL in normal  range and within 360 days    LDL Chol Calc (NIH)  Date Value Ref Range Status  11/03/2019 49 0 - 99 mg/dL Final         Passed - Patient is not pregnant      Passed - Valid encounter within last 12 months    Recent Outpatient Visits          1 month ago Type 2 diabetes with nephropathy ([place])   [place], Jolene T, NP   4 months ago Type 2 diabetes with nephropathy ([place])   [place], Jolene T, NP   7 months ago Type 2 diabetes with nephropathy ([place])   [place], Jolene T, NP   1 year ago Type 2 diabetes with nephropathy ([place])   [place], Barbaraann Faster, NP   1 year ago Annual physical exam   [place], Barbaraann Faster, NP      Future Appointments            In 1 month Cannady, Barbaraann Faster, NP [facility], [place]   In 4 months [place], [place], PA-C [facility]

## 2020-04-11 ENCOUNTER — Other Ambulatory Visit: Payer: Self-pay | Admitting: Urology

## 2020-05-03 ENCOUNTER — Other Ambulatory Visit: Payer: Self-pay

## 2020-05-03 ENCOUNTER — Ambulatory Visit (INDEPENDENT_AMBULATORY_CARE_PROVIDER_SITE_OTHER): Payer: Medicare HMO | Admitting: Nurse Practitioner

## 2020-05-03 ENCOUNTER — Encounter: Payer: Self-pay | Admitting: Nurse Practitioner

## 2020-05-03 VITALS — BP 106/72 | HR 73 | Temp 98.3°F | Ht 69.5 in | Wt 216.0 lb

## 2020-05-03 DIAGNOSIS — E785 Hyperlipidemia, unspecified: Secondary | ICD-10-CM

## 2020-05-03 DIAGNOSIS — E1159 Type 2 diabetes mellitus with other circulatory complications: Secondary | ICD-10-CM | POA: Diagnosis not present

## 2020-05-03 DIAGNOSIS — Z0000 Encounter for general adult medical examination without abnormal findings: Secondary | ICD-10-CM | POA: Diagnosis not present

## 2020-05-03 DIAGNOSIS — E1169 Type 2 diabetes mellitus with other specified complication: Secondary | ICD-10-CM

## 2020-05-03 DIAGNOSIS — Z125 Encounter for screening for malignant neoplasm of prostate: Secondary | ICD-10-CM | POA: Diagnosis not present

## 2020-05-03 DIAGNOSIS — E538 Deficiency of other specified B group vitamins: Secondary | ICD-10-CM

## 2020-05-03 DIAGNOSIS — I10 Essential (primary) hypertension: Secondary | ICD-10-CM

## 2020-05-03 DIAGNOSIS — E1121 Type 2 diabetes mellitus with diabetic nephropathy: Secondary | ICD-10-CM

## 2020-05-03 DIAGNOSIS — N1831 Chronic kidney disease, stage 3a: Secondary | ICD-10-CM

## 2020-05-03 DIAGNOSIS — E669 Obesity, unspecified: Secondary | ICD-10-CM

## 2020-05-03 DIAGNOSIS — Z87891 Personal history of nicotine dependence: Secondary | ICD-10-CM

## 2020-05-03 DIAGNOSIS — Z23 Encounter for immunization: Secondary | ICD-10-CM | POA: Diagnosis not present

## 2020-05-03 LAB — UA/M W/RFLX CULTURE, ROUTINE
Bilirubin, UA: NEGATIVE
Glucose, UA: NEGATIVE
Leukocytes,UA: NEGATIVE
Nitrite, UA: NEGATIVE
RBC, UA: NEGATIVE
Specific Gravity, UA: 1.025 (ref 1.005–1.030)
Urobilinogen, Ur: 0.2 mg/dL (ref 0.2–1.0)
pH, UA: 5 (ref 5.0–7.5)

## 2020-05-03 LAB — BAYER DCA HB A1C WAIVED: HB A1C (BAYER DCA - WAIVED): 5.8 % (ref <7.0)

## 2020-05-03 LAB — MICROSCOPIC EXAMINATION
Bacteria, UA: NONE SEEN
WBC, UA: NONE SEEN /hpf (ref 0–5)

## 2020-05-03 MED ORDER — METFORMIN HCL 500 MG PO TABS
500.0000 mg | ORAL_TABLET | Freq: Two times a day (BID) | ORAL | 4 refills | Status: DC
Start: 2020-05-03 — End: 2021-05-04

## 2020-05-03 NOTE — Assessment & Plan Note (Signed)
Chronic, ongoing.  Continue current medication regimen and adjust as needed.  Lipid panel today.  Return in 6 months. 

## 2020-05-03 NOTE — Assessment & Plan Note (Signed)
Chronic, ongoing. Continue Lisinopril for kidney protection.  CMP next visit.  Consider nephrology referral if decline noted.

## 2020-05-03 NOTE — Patient Instructions (Signed)
Healthy Eating Following a healthy eating pattern may help you to achieve and maintain a healthy body weight, reduce the risk of chronic disease, and live a long and productive life. It is important to follow a healthy eating pattern at an appropriate calorie level for your body. Your nutritional needs should be met primarily through food by choosing a variety of nutrient-rich foods. What are tips for following this plan? Reading food labels  Read labels and choose the following: ? Reduced or low sodium. ? Juices with 100% fruit juice. ? Foods with low saturated fats and high polyunsaturated and monounsaturated fats. ? Foods with whole grains, such as whole wheat, cracked wheat, brown rice, and wild rice. ? Whole grains that are fortified with folic acid. This is recommended for women who are pregnant or who want to become pregnant.  Read labels and avoid the following: ? Foods with a lot of added sugars. These include foods that contain brown sugar, corn sweetener, corn syrup, dextrose, fructose, glucose, high-fructose corn syrup, honey, invert sugar, lactose, malt syrup, maltose, molasses, raw sugar, sucrose, trehalose, or turbinado sugar.  Do not eat more than the following amounts of added sugar per day:  6 teaspoons (25 g) for women.  9 teaspoons (38 g) for men. ? Foods that contain processed or refined starches and grains. ? Refined grain products, such as white flour, degermed cornmeal, white bread, and white rice. Shopping  Choose nutrient-rich snacks, such as vegetables, whole fruits, and nuts. Avoid high-calorie and high-sugar snacks, such as potato chips, fruit snacks, and candy.  Use oil-based dressings and spreads on foods instead of solid fats such as butter, stick margarine, or cream cheese.  Limit pre-made sauces, mixes, and "instant" products such as flavored rice, instant noodles, and ready-made pasta.  Try more plant-protein sources, such as tofu, tempeh, black beans,  edamame, lentils, nuts, and seeds.  Explore eating plans such as the Mediterranean diet or vegetarian diet. Cooking  Use oil to saut or stir-fry foods instead of solid fats such as butter, stick margarine, or lard.  Try baking, boiling, grilling, or broiling instead of frying.  Remove the fatty part of meats before cooking.  Steam vegetables in water or broth. Meal planning   At meals, imagine dividing your plate into fourths: ? One-half of your plate is fruits and vegetables. ? One-fourth of your plate is whole grains. ? One-fourth of your plate is protein, especially lean meats, poultry, eggs, tofu, beans, or nuts.  Include low-fat dairy as part of your daily diet. Lifestyle  Choose healthy options in all settings, including home, work, school, restaurants, or stores.  Prepare your food safely: ? Wash your hands after handling raw meats. ? Keep food preparation surfaces clean by regularly washing with hot, soapy water. ? Keep raw meats separate from ready-to-eat foods, such as fruits and vegetables. ? Cook seafood, meat, poultry, and eggs to the recommended internal temperature. ? Store foods at safe temperatures. In general:  Keep cold foods at 59F (4.4C) or below.  Keep hot foods at 159F (60C) or above.  Keep your freezer at [facility] (-17.8C) or below.  Foods are no longer safe to eat when they have been between the temperatures of 40-159F (4.4-60C) for more than 2 hours. What foods should I eat? Fruits Aim to eat 2 cup-equivalents of fresh, canned (in natural juice), or frozen fruits each day. Examples of 1 cup-equivalent of fruit include 1 small apple, 8 large strawberries, 1 cup canned fruit,  cup  dried fruit, or 1 cup 100% juice. Vegetables Aim to eat 2-3 cup-equivalents of fresh and frozen vegetables each day, including different varieties and colors. Examples of 1 cup-equivalent of vegetables include 2 medium carrots, 2 cups raw, leafy greens, 1 cup chopped  vegetable (raw or cooked), or 1 medium baked potato. Grains Aim to eat 6 ounce-equivalents of whole grains each day. Examples of 1 ounce-equivalent of grains include 1 slice of bread, 1 cup ready-to-eat cereal, 3 cups popcorn, or  cup cooked rice, pasta, or cereal. Meats and other proteins Aim to eat 5-6 ounce-equivalents of protein each day. Examples of 1 ounce-equivalent of protein include 1 egg, 1/2 cup nuts or seeds, or 1 tablespoon (16 g) peanut butter. A cut of meat or fish that is the size of a deck of cards is about 3-4 ounce-equivalents.  Of the protein you eat each week, try to have at least 8 ounces come from seafood. This includes salmon, trout, herring, and anchovies. Dairy Aim to eat 3 cup-equivalents of fat-free or low-fat dairy each day. Examples of 1 cup-equivalent of dairy include 1 cup (240 mL) milk, 8 ounces (250 g) yogurt, 1 ounces (44 g) natural cheese, or 1 cup (240 mL) fortified soy milk. Fats and oils  Aim for about 5 teaspoons (21 g) per day. Choose monounsaturated fats, such as canola and olive oils, avocados, peanut butter, and most nuts, or polyunsaturated fats, such as sunflower, corn, and soybean oils, walnuts, pine nuts, sesame seeds, sunflower seeds, and flaxseed. Beverages  Aim for six 8-oz glasses of water per day. Limit coffee to three to five 8-oz cups per day.  Limit caffeinated beverages that have added calories, such as soda and energy drinks.  Limit alcohol intake to no more than 1 drink a day for nonpregnant women and 2 drinks a day for men. One drink equals 12 oz of beer (355 mL), 5 oz of wine (148 mL), or 1 oz of hard liquor (44 mL). Seasoning and other foods  Avoid adding excess amounts of salt to your foods. Try flavoring foods with herbs and spices instead of salt.  Avoid adding sugar to foods.  Try using oil-based dressings, sauces, and spreads instead of solid fats. This information is based on general [place] nutrition guidelines. For more  information, visit Build[employer].es. Exact amounts may vary based on your nutrition needs. Summary  A healthy eating plan may help you to maintain a healthy weight, reduce the risk of chronic diseases, and stay active throughout your life.  Plan your meals. Make sure you eat the right portions of a variety of nutrient-rich foods.  Try baking, boiling, grilling, or broiling instead of frying.  Choose healthy options in all settings, including home, work, school, restaurants, or stores. This information is not intended to replace advice given to you by your health care provider. Make sure you discuss any questions you have with your health care provider. Document Revised: 11/24/2017 Document Reviewed: 11/24/2017 Elsevier Patient Education  [place].

## 2020-05-03 NOTE — Assessment & Plan Note (Signed)
Chronic, ongoing with BP below goal today.  Recommend he check BP occasionally at home and document & focus on DASH diet. Continue current medication regimen and adjust as needed.  Urine ALB trace today, continue Lisinopril for kidney protection.  CMP today.  Return in 6 months.

## 2020-05-03 NOTE — Assessment & Plan Note (Signed)
Chronic, ongoing.  Recent A1C below goal at 5.8% today and urine ALB trace.  Continue current medication regimen, including Lisinopril for kidney protection.  Recommend he continue to check BS a few times a week at home.  Return in 6 months for follow-up.  May consider reduction of Metformin to once a day if continued good control and weight loss.

## 2020-05-03 NOTE — Assessment & Plan Note (Signed)
Recommended eating smaller high protein, low fat meals more frequently and exercising 30 mins a day 5 times a week with a goal of 10-15lb weight loss in the next 3 months. Patient voiced their understanding and motivation to adhere to these recommendations.  

## 2020-05-03 NOTE — Progress Notes (Signed)
BP 106/72    Pulse 73    Temp 98.3 F (36.8 C) (Oral)    Ht 5' 9.5" (1.765 m)    Wt 216 lb (98 kg)    SpO2 99%    BMI 31.44 kg/m    Subjective:    Patient ID: Fernando Frederick, male    DOB: Aug 07, 1956, 64 y.o.   MRN: [id number]  HPI: Fernando Frederick is a 64 y.o. male presenting on 05/03/2020 for comprehensive medical examination. Current medical complaints include:none  He currently lives with: wife Interim Problems from his last visit: no   DIABETES Last A1C was5.8% June. Continues on Metformin 500 MG BID.Continues focus on exercise. Hypoglycemic episodes:no Polydipsia/polyuria:no Visual disturbance:no Chest pain:no Paresthesias:no Glucose Monitoring:yes Accucheck frequency: rarely Fasting glucose:last check was 105 -- averaging 90-100  Post prandial: Evening: Before meals: Taking Insulin?:no Long acting insulin: Short acting insulin: Blood Pressure Monitoring:not checking Retinal Examination:Not Up To Date -- due next month with Dr. Gloriann Loan Foot Exam:Up to Date Pneumovax:Up to Date Influenza:Up to Date Aspirin:yes  HYPERTENSION / HYPERLIPIDEMIA Current Amlodipine, Lisinopril-HCTZ and Crestor. Satisfied with current treatment?yes Duration of hypertension:chronic BP monitoring frequency:not checking BP range:  BP medication side effects:no Duration of hyperlipidemia:chronic Cholesterol medication side effects:no Cholesterol supplements: none Medication compliance:good compliance Aspirin:yes Recent stressors:no Recurrent headaches:no Visual changes:no Palpitations:no Dyspnea:no Chest pain:no Lower extremity edema:no Dizzy/lightheaded:no  CHRONIC KIDNEY DISEASE Continues on Lisinopril for kidney protection.Last CRT 1.43 and GFR 59. CKD status:stable Medications renally dose:yes Previous renal evaluation:no Pneumovax:Up to  Date Influenza Vaccine:Up to Date  Functional Status Survey: Is the patient deaf or have difficulty hearing?: Yes Does the patient have difficulty seeing, even when wearing glasses/contacts?: No Does the patient have difficulty concentrating, remembering, or making decisions?: No Does the patient have difficulty walking or climbing stairs?: No Does the patient have difficulty dressing or bathing?: No Does the patient have difficulty doing errands alone such as visiting a doctor's office or shopping?: No  FALL RISK: Fall Risk  10/29/2019 06/05/2018 05/02/2017 04/23/2016  Falls in the past year? 0 No No No  Number falls in past yr: 0 - - -  Injury with Fall? 0 - - -  Follow up Falls evaluation completed - - -    Depression Screen Depression screen [facility] 2/9 05/03/2020 10/29/2019 05/02/2017 04/23/2016  Decreased Interest 0 0 0 0  Down, Depressed, Hopeless 0 0 0 0  PHQ - 2 Score 0 0 0 0  Altered sleeping 1 - 3 -  Tired, decreased energy 1 - 3 -  Change in appetite 1 - 0 -  Feeling bad or failure about yourself  0 - 0 -  Trouble concentrating 0 - 0 -  Moving slowly or fidgety/restless 0 - 0 -  Suicidal thoughts 0 - 0 -  PHQ-9 Score 3 - 6 -  Difficult doing work/chores Not difficult at all - - -    Advanced Directives <no information>  Past Medical History:  Past Medical History:  Diagnosis Date   Benign hypertension with chronic kidney disease 04/24/2015   Chronic kidney disease    CKD (chronic kidney disease), stage III 04/24/2015   Diabetes ([place]) 04/24/2015   Diabetes mellitus without complication ([place])    Gout 04/24/2015   Hyperlipidemia    Hypertension     Surgical History:  Past Surgical History:  Procedure Laterality Date   partial thumb amputation      Medications:  Current Outpatient Medications on File Prior to Visit  Medication Sig  amLODipine (NORVASC) 10 MG tablet Take 1 tablet (10 mg total) by mouth daily.   aspirin 81 MG tablet Take 81 mg by mouth  daily.   Cholecalciferol (VITAMIN D-3) 1000 UNITS CAPS Take 1,000 Units by mouth daily.   finasteride (PROSCAR) 5 MG tablet Take 1 tablet (5 mg total) by mouth daily.   lisinopril-hydrochlorothiazide (ZESTORETIC) 20-12.5 MG tablet Take 1 tablet by mouth daily.   Multiple Vitamin (MULTIVITAMIN) tablet Take 1 tablet by mouth daily.   Omega-3 Fatty Acids (FISH OIL) 1000 MG CAPS Take 1,000 mg by mouth daily.   rosuvastatin (CRESTOR) 20 MG tablet Take 1 tablet (20 mg total) by mouth daily.   No current facility-administered medications on file prior to visit.    Allergies:  No Known Allergies  Social History:  Social History   Socioeconomic History   Marital status: Married    Spouse name: Not on file   Number of children: Not on file   Years of education: Not on file   Highest education level: Not on file  Occupational History   Not on file  Tobacco Use   Smoking status: Former Smoker    Quit date: 08/26/2004    Years since quitting: 15.6   Smokeless tobacco: Never Used  Vaping Use   Vaping Use: Never used  Substance and Sexual Activity   Alcohol use: Yes    Alcohol/week: 6.0 standard drinks    Types: 6 Cans of beer per week   Drug use: No   Sexual activity: Yes  Other Topics Concern   Not on file  Social History Narrative   Not on file   Social Determinants of Health   Financial Resource Strain:    Difficulty of Paying Living Expenses: Not on file  Food Insecurity:    Worried About [place] in the Last Year: Not on file   Ran Out of Food in the Last Year: Not on file  Transportation Needs:    Lack of Transportation (Medical): Not on file   Lack of Transportation (Non-Medical): Not on file  Physical Activity:    Days of Exercise per Week: Not on file   Minutes of Exercise per Session: Not on file  Stress:    Feeling of Stress : Not on file  Social Connections:    Frequency of Communication with Friends and Family: Not on file    Frequency of Social Gatherings with Friends and Family: Not on file   Attends Religious Services: Not on file   Active Member of Clubs or Organizations: Not on file   Attends Archivist Meetings: Not on file   Marital Status: Not on file  Intimate Partner Violence:    Fear of Current or Ex-Partner: Not on file   Emotionally Abused: Not on file   Physically Abused: Not on file   Sexually Abused: Not on file   Social History   Tobacco Use  Smoking Status Former Smoker   Quit date: 08/26/2004   Years since quitting: 15.6  Smokeless Tobacco Never Used   Social History   Substance and Sexual Activity  Alcohol Use Yes   Alcohol/week: 6.0 standard drinks   Types: 6 Cans of beer per week    Family History:  Family History  Problem Relation Age of Onset   Cancer Mother        lung   Hypertension Mother    Diabetes Father    Hypertension Father    Kidney disease Father  Kidney disease Brother    Hypertension Sister    Hypertension Sister    Hypertension Sister     Past medical history, surgical history, medications, allergies, family history and social history reviewed with patient today and changes made to appropriate areas of the chart.   Review of Systems - negative All other ROS negative except what is listed above and in the HPI.      Objective:    BP 106/72    Pulse 73    Temp 98.3 F (36.8 C) (Oral)    Ht 5' 9.5" (1.765 m)    Wt 216 lb (98 kg)    SpO2 99%    BMI 31.44 kg/m   Wt Readings from Last 3 Encounters:  05/03/20 216 lb (98 kg)  02/02/20 212 lb (96.2 kg)  07/21/19 218 lb (98.9 kg)    Physical Exam Vitals and nursing note reviewed.  Constitutional:      General: He is awake. He is not in acute distress.    Appearance: He is well-developed. He is morbidly obese. He is not ill-appearing.  HENT:     Head: Normocephalic and atraumatic.     Right Ear: Hearing, tympanic membrane, ear canal and external ear normal. No  drainage.     Left Ear: Hearing, tympanic membrane, ear canal and external ear normal. No drainage.     Nose: Nose normal.     Mouth/Throat:     Pharynx: Uvula midline.  Eyes:     General: Lids are normal.        Right eye: No discharge.        Left eye: No discharge.     Extraocular Movements: Extraocular movements intact.     Conjunctiva/sclera: Conjunctivae normal.     Pupils: Pupils are equal, round, and reactive to light.     Visual Fields: Right eye visual fields normal and left eye visual fields normal.  Neck:     Thyroid: No thyromegaly.     Vascular: No carotid bruit or JVD.     Trachea: Trachea normal.  Cardiovascular:     Rate and Rhythm: Normal rate and regular rhythm.     Heart sounds: Normal heart sounds, S1 normal and S2 normal. No murmur heard.  No gallop.   Pulmonary:     Effort: Pulmonary effort is normal. No accessory muscle usage or respiratory distress.     Breath sounds: Normal breath sounds.  Abdominal:     General: Bowel sounds are normal.     Palpations: Abdomen is soft. There is no hepatomegaly or splenomegaly.     Tenderness: There is no abdominal tenderness.  Musculoskeletal:        General: Normal range of motion.     Cervical back: Normal range of motion and neck supple.     Right lower leg: No edema.     Left lower leg: No edema.  Lymphadenopathy:     Head:     Right side of head: No submental, submandibular, tonsillar, preauricular or posterior auricular adenopathy.     Left side of head: No submental, submandibular, tonsillar, preauricular or posterior auricular adenopathy.     Cervical: No cervical adenopathy.  Skin:    General: Skin is warm and dry.     Capillary Refill: Capillary refill takes less than 2 seconds.     Findings: No rash.  Neurological:     Mental Status: He is alert and oriented to person, place, and time.     Cranial  Nerves: Cranial nerves are intact.     Gait: Gait is intact.     Deep Tendon Reflexes: Reflexes are  normal and symmetric.     Reflex Scores:      Brachioradialis reflexes are 2+ on the right side and 2+ on the left side.      Patellar reflexes are 2+ on the right side and 2+ on the left side. Psychiatric:        Attention and Perception: Attention normal.        Mood and Affect: Mood normal.        Speech: Speech normal.        Behavior: Behavior normal. Behavior is cooperative.        Thought Content: Thought content normal.        Cognition and Memory: Cognition normal.        Judgment: Judgment normal.     Diabetic Foot Exam - Simple   Simple Foot Form Visual Inspection No deformities, no ulcerations, no other skin breakdown bilaterally: Yes Sensation Testing Intact to touch and monofilament testing bilaterally: Yes Pulse Check Posterior Tibialis and Dorsalis pulse intact bilaterally: Yes Comments     Results for orders placed or performed in visit on 02/02/20  Bayer DCA Hb A1c Waived  Result Value Ref Range   HB A1C (BAYER DCA - WAIVED) 5.8 <7.0 %      Assessment & Plan:   Problem List Items Addressed This Visit      Cardiovascular and Mediastinum   Hypertension associated with diabetes ([place])    Chronic, ongoing with BP below goal today.  Recommend he check BP occasionally at home and document & focus on DASH diet. Continue current medication regimen and adjust as needed.  Urine ALB trace today, continue Lisinopril for kidney protection.  CMP today.  Return in 6 months.      Relevant Medications   metFORMIN (GLUCOPHAGE) 500 MG tablet   Other Relevant Orders   Bayer DCA Hb A1c Waived   CBC with Differential/Platelet   TSH   UA/M w/rflx Culture, Routine     Endocrine   Hyperlipidemia associated with type 2 diabetes mellitus (HCC)    Chronic, ongoing.  Continue current medication regimen and adjust as needed.  Lipid panel today.  Return in 6 months.      Relevant Medications   metFORMIN (GLUCOPHAGE) 500 MG tablet   Other Relevant Orders   Bayer DCA Hb A1c  Waived   Comprehensive metabolic panel   Lipid Panel w/o Chol/HDL Ratio   Type 2 diabetes with nephropathy (HCC)    Chronic, ongoing.  Recent A1C below goal at 5.8% today and urine ALB trace.  Continue current medication regimen, including Lisinopril for kidney protection.  Recommend he continue to check BS a few times a week at home.  Return in 6 months for follow-up.  May consider reduction of Metformin to once a day if continued good control and weight loss.      Relevant Medications   metFORMIN (GLUCOPHAGE) 500 MG tablet     Genitourinary   CKD (chronic kidney disease), stage III    Chronic, ongoing. Continue Lisinopril for kidney protection.  CMP next visit.  Consider nephrology referral if decline noted.        Other   Obesity (BMI 30-39.9)    Recommended eating smaller high protein, low fat meals more frequently and exercising 30 mins a day 5 times a week with a goal of 10-15lb weight loss in the  next 3 months. Patient voiced their understanding and motivation to adhere to these recommendations.       Relevant Medications   metFORMIN (GLUCOPHAGE) 500 MG tablet   History of smoking    Order for one time AAA screening per guideline recommendations.      Relevant Orders   US [facility] MEDICARE SCREENING    Other Visit Diagnoses    Routine general medical examination at a health care facility    -  Primary   Yearly labs today   B12 deficiency       Check B12 level today, currently taking supplement daily.   Relevant Orders   Bayer DCA Hb A1c Waived   Vitamin B12   Prostate cancer screening       PSA on screening labs, continue urology follow-up.   Relevant Orders   PSA   Flu vaccine need       Relevant Orders   Flu Vaccine QUAD High Dose(Fluad)       Discussed aspirin prophylaxis for myocardial infarction prevention and decision was made to continue ASA  LABORATORY TESTING:  Health maintenance labs ordered today as discussed above.   The natural history of  prostate cancer and ongoing controversy regarding screening and potential treatment outcomes of prostate cancer has been discussed with the patient. The meaning of a false positive PSA and a false negative PSA has been discussed. He indicates understanding of the limitations of this screening test and wishes to proceed with screening PSA testing.   IMMUNIZATIONS:   - Tdap: Tetanus vaccination status reviewed: last tetanus booster within 10 years. - Influenza: Up to date - Pneumovax: Not applicable - Prevnar: Administered today - Zostavax vaccine: next visit  SCREENING: - Colonoscopy: Up To Date Discussed with patient purpose of the colonoscopy is to detect colon cancer at curable precancerous or early stages   - AAA Screening: Not applicable  -Hearing Test: Not applicable  -Spirometry: Not applicable   PATIENT COUNSELING:    Sexuality: Discussed sexually transmitted diseases, partner selection, use of condoms, avoidance of unintended pregnancy  and contraceptive alternatives.   Advised to avoid cigarette smoking.  I discussed with the patient that most people either abstain from alcohol or drink within safe limits (<=14/week and <=4 drinks/occasion for males, <=7/weeks and <= 3 drinks/occasion for females) and that the risk for alcohol disorders and other health effects rises proportionally with the number of drinks per week and how often a drinker exceeds daily limits.  Discussed cessation/primary prevention of drug use and availability of treatment for abuse.   Diet: Encouraged to adjust caloric intake to maintain  or achieve ideal body weight, to reduce intake of dietary saturated fat and total fat, to limit sodium intake by avoiding high sodium foods and not adding table salt, and to maintain adequate dietary potassium and calcium preferably from fresh fruits, vegetables, and low-fat dairy products.    stressed the importance of regular exercise  Injury prevention: Discussed  safety belts, safety helmets, smoke detector, smoking near bedding or upholstery.   Dental health: Discussed importance of regular tooth brushing, flossing, and dental visits.   Follow up plan: NEXT PREVENTATIVE PHYSICAL DUE IN 1 YEAR. Return in about 6 months (around 10/31/2020) for T2DM, HTN/HLD.

## 2020-05-03 NOTE — Assessment & Plan Note (Signed)
Order for one time AAA screening per guideline recommendations.

## 2020-05-04 ENCOUNTER — Encounter: Payer: Self-pay | Admitting: Nurse Practitioner

## 2020-05-04 LAB — CBC WITH DIFFERENTIAL/PLATELET
Basophils Absolute: 0 10*3/uL (ref 0.0–0.2)
Basos: 1 %
EOS (ABSOLUTE): 0.3 10*3/uL (ref 0.0–0.4)
Eos: 7 %
Hematocrit: 44.6 % (ref 37.5–51.0)
Hemoglobin: 14.9 g/dL (ref 13.0–17.7)
Immature Grans (Abs): 0 10*3/uL (ref 0.0–0.1)
Immature Granulocytes: 0 %
Lymphocytes Absolute: 2 10*3/uL (ref 0.7–3.1)
Lymphs: 46 %
MCH: 27.7 pg (ref 26.6–33.0)
MCHC: 33.4 g/dL (ref 31.5–35.7)
MCV: 83 fL (ref 79–97)
Monocytes Absolute: 0.5 10*3/uL (ref 0.1–0.9)
Monocytes: 12 %
Neutrophils Absolute: 1.5 10*3/uL (ref 1.4–7.0)
Neutrophils: 34 %
Platelets: 300 10*3/uL (ref 150–450)
RBC: 5.37 x10E6/uL (ref 4.14–5.80)
RDW: 13.8 % (ref 11.6–15.4)
WBC: 4.3 10*3/uL (ref 3.4–10.8)

## 2020-05-04 LAB — COMPREHENSIVE METABOLIC PANEL
ALT: 20 IU/L (ref 0–44)
AST: 21 IU/L (ref 0–40)
Albumin/Globulin Ratio: 1.5 (ref 1.2–2.2)
Albumin: 4.6 g/dL (ref 3.8–4.8)
Alkaline Phosphatase: 76 IU/L (ref 48–121)
BUN/Creatinine Ratio: 18 (ref 10–24)
BUN: 34 mg/dL — ABNORMAL HIGH (ref 8–27)
Bilirubin Total: 0.3 mg/dL (ref 0.0–1.2)
CO2: 22 mmol/L (ref 20–29)
Calcium: 9.8 mg/dL (ref 8.6–10.2)
Chloride: 103 mmol/L (ref 96–106)
Creatinine, Ser: 1.92 mg/dL — ABNORMAL HIGH (ref 0.76–1.27)
GFR calc Af Amer: 41 mL/min/{1.73_m2} — ABNORMAL LOW (ref >59)
GFR calc non Af Amer: 35 mL/min/{1.73_m2} — ABNORMAL LOW (ref >59)
Globulin, Total: 3.1 g/dL (ref 1.5–4.5)
Glucose: 102 mg/dL — ABNORMAL HIGH (ref 65–99)
Potassium: 4.7 mmol/L (ref 3.5–5.2)
Sodium: 142 mmol/L (ref 134–144)
Total Protein: 7.7 g/dL (ref 6.0–8.5)

## 2020-05-04 LAB — LIPID PANEL W/O CHOL/HDL RATIO
Cholesterol, Total: 177 mg/dL (ref 100–199)
HDL: 42 mg/dL (ref >39)
LDL Chol Calc (NIH): 76 mg/dL (ref 0–99)
Triglycerides: 371 mg/dL — ABNORMAL HIGH (ref 0–149)
VLDL Cholesterol Cal: 59 mg/dL — ABNORMAL HIGH (ref 5–40)

## 2020-05-04 LAB — TSH: TSH: 0.849 u[IU]/mL (ref 0.450–4.500)

## 2020-05-04 LAB — VITAMIN B12: Vitamin B-12: 797 pg/mL (ref 232–1245)

## 2020-05-04 LAB — PSA: Prostate Specific Ag, Serum: 3.2 ng/mL (ref 0.0–4.0)

## 2020-05-04 NOTE — Progress Notes (Signed)
Letter out for patient.

## 2020-05-11 ENCOUNTER — Ambulatory Visit
Admission: RE | Admit: 2020-05-11 | Discharge: 2020-05-11 | Disposition: A | Discharge status: Home / Self Care | Payer: Medicare HMO | Source: Ambulatory Visit | Attending: Nurse Practitioner | Admitting: Nurse Practitioner

## 2020-05-11 ENCOUNTER — Other Ambulatory Visit: Payer: Self-pay

## 2020-05-11 DIAGNOSIS — Z87891 Personal history of nicotine dependence: Secondary | ICD-10-CM | POA: Diagnosis not present

## 2020-05-13 ENCOUNTER — Other Ambulatory Visit: Payer: Self-pay | Admitting: Urology

## 2020-05-13 ENCOUNTER — Other Ambulatory Visit: Payer: Self-pay | Admitting: Nurse Practitioner

## 2020-06-07 ENCOUNTER — Other Ambulatory Visit: Payer: Self-pay | Admitting: Urology

## 2020-06-07 ENCOUNTER — Other Ambulatory Visit: Payer: Self-pay | Admitting: Nurse Practitioner

## 2020-07-12 ENCOUNTER — Other Ambulatory Visit: Payer: Self-pay | Admitting: Urology

## 2020-07-13 ENCOUNTER — Other Ambulatory Visit: Payer: Self-pay

## 2020-07-13 DIAGNOSIS — R972 Elevated prostate specific antigen [PSA]: Secondary | ICD-10-CM

## 2020-07-17 ENCOUNTER — Other Ambulatory Visit: Payer: Medicare HMO

## 2020-07-17 ENCOUNTER — Other Ambulatory Visit: Payer: Self-pay

## 2020-07-17 DIAGNOSIS — R972 Elevated prostate specific antigen [PSA]: Secondary | ICD-10-CM

## 2020-07-18 LAB — PSA: Prostate Specific Ag, Serum: 3.2 ng/mL (ref 0.0–4.0)

## 2020-07-24 ENCOUNTER — Ambulatory Visit: Payer: Medicare HMO | Admitting: Physician Assistant

## 2020-07-24 ENCOUNTER — Other Ambulatory Visit: Payer: Self-pay

## 2020-07-24 ENCOUNTER — Encounter: Payer: Self-pay | Admitting: Physician Assistant

## 2020-07-24 VITALS — BP 125/75 | HR 79 | Ht 70.0 in | Wt 218.0 lb

## 2020-07-24 DIAGNOSIS — N138 Other obstructive and reflux uropathy: Secondary | ICD-10-CM | POA: Diagnosis not present

## 2020-07-24 DIAGNOSIS — N401 Enlarged prostate with lower urinary tract symptoms: Secondary | ICD-10-CM

## 2020-07-24 LAB — BLADDER SCAN AMB NON-IMAGING

## 2020-07-24 NOTE — Patient Instructions (Addendum)
CONTINUE finasteride. REDUCE your fluid intake after dinnertime. As discussed today, your PSA, the marker of your prostate health, has risen over 1 point in the past year. I recommend that you undergo a prostate biopsy at this time.   Transrectal Ultrasound-Guided Prostate Biopsy A transrectal ultrasound-guided prostate biopsy is a procedure to remove samples of prostate tissue for testing. The procedure uses ultrasound images to guide the process of removing the samples. The samples are taken to a lab to be checked for prostate cancer. This procedure is usually done to evaluate the prostate gland of men who have raised (elevated) levels of prostate-specific antigen (PSA), which can be a sign of prostate cancer. Tell a health care provider about:  Any allergies you have.  All medicines you are taking, including vitamins, herbs, eye drops, creams, and over-the-counter medicines.  Any problems you or family members have had with anesthetic medicines.  Any blood disorders you have.  Any surgeries you have had.  Any medical conditions you have. What are the risks? Generally, this is a safe procedure. However, problems may occur, including:  Prostate infection.  Bleeding from the rectum.  Blood in the urine.  Allergic reactions to medicines.  Damage to surrounding structures such as blood vessels, organs, or muscles.  Difficulty passing urine.  Nerve damage. This is usually temporary.  Pain. What happens before the procedure? Staying hydrated Follow instructions from your health care provider about hydration, which may include:  Up to 2 hours before the procedure - you may continue to drink clear liquids, such as water, clear fruit juice, black coffee, and plain tea.  Eating and drinking restrictions Follow instructions from your health care provider about eating and drinking, which may include:  8 hours before the procedure - stop eating heavy meals or foods such as meat,  fried foods, or fatty foods.  6 hours before the procedure - stop eating light meals or foods, such as toast or cereal.  6 hours before the procedure - stop drinking milk or drinks that contain milk.  2 hours before the procedure - stop drinking clear liquids. Medicines Ask your health care provider about:  Changing or stopping your regular medicines. This is especially important if you are taking diabetes medicines or blood thinners.  Taking over-the-counter medicines, vitamins, herbs, and supplements.  Taking medicines such as aspirin and ibuprofen. These medicines can thin your blood. Do not take these medicines unless your health care provider tells you to take them. General instructions  You may be given antibiotic medicine to help prevent infection. If so, take the antibiotic as told by your health care provider.  You will be given an enema. During an enema, a liquid is injected into your rectum to clear out waste.  You may have a blood or urine sample taken.  Plan to have someone take you home from the hospital or clinic. What happens during the procedure?   To lower your risk of infection: ? Your health care team will wash or sanitize their hands. ? Hair may be removed from the surgical area. ? Your skin will be cleaned with a germ-killing soap. ? You may be given antibiotic medicine.  An IV will be inserted into one of your veins.  You will be given one or both of the following: ? A medicine to help you relax (sedative). ? A medicine to numb the area (local anesthetic).  You will be placed on your left side, and your knees will be bent toward your  chest.  A probe with lubricated gel will be placed into your rectum, and images will be taken of your prostate and surrounding structures.  Numbing medicine will be injected into your prostate.  A biopsy needle will be inserted through your rectum and guided to your prostate using the ultrasound images.  Prostate  tissue samples will be removed, and the needle will then be removed.  The biopsy samples will be sent to a lab to be tested. The procedure may vary among health care providers and hospitals. What happens after the procedure?  Your blood pressure, heart rate, breathing rate, and blood oxygen level will be monitored until the medicines you were given have worn off.  You may have some discomfort in the rectal area. You will be given pain medicine as needed.  Do not drive for 24 hours if you received a sedative. Summary  A transrectal ultrasound-guided biopsy removes samples of tissue from your prostate.  This procedure is usually done to evaluate the prostate gland of men who have raised (elevated) levels of prostate-specific antigen (PSA), which can be a sign of prostate cancer.  After your procedure, you may feel some discomfort in the rectal area.  Plan to have someone take you home from the hospital or clinic. This information is not intended to replace advice given to you by your health care provider. Make sure you discuss any questions you have with your health care provider. Document Revised: 12/02/2018 Document Reviewed: 11/08/2016 Elsevier Patient Education  [place].

## 2020-07-24 NOTE — Progress Notes (Signed)
07/24/2020 1:16 PM   Fernando Frederick [birth year] [id number]  CC: Chief Complaint  Patient presents with  . Follow-up    HPI: Fernando Frederick is a 64 y.o. male with PMH BPH and fluctuating PSA on finasteride who presents today for routine annual follow-up.  He was last seen by Dr. Erlene Quan on 07/21/2019.  Today, patient reports stable urinary symptoms.  He is mostly bothered by his nocturia x2-3 and reports no problems with urination during the daytime.  IPSS 11/3 as below, previously 5/2.  He reports drinking water and tea up until bedtime.  He is a snorer and has not previously undergone sleep study.  He denies a history of BLE edema.  He has been taking finasteride continuously as prescribed.  PSA history as below.  Notably, he reports his brother may recently have been diagnosed with prostate cancer.  PVR 73mL.  Date  PSA (all on finasteride) 06/17/18 2.9 01/14/19 2.6 07/15/19 2.0 05/03/20  3.2 07/17/20 3.2   IPSS    Row Name 07/24/20 1300         International Prostate Symptom Score   How often have you had the sensation of not emptying your bladder? Less than 1 in 5     How often have you had to urinate less than every two hours? About half the time     How often have you found you stopped and started again several times when you urinated? Less than 1 in 5 times     How often have you found it difficult to postpone urination? Less than 1 in 5 times     How often have you had a weak urinary stream? Less than half the time     How often have you had to strain to start urination? Not at All     How many times did you typically get up at night to urinate? 3 Times     Total IPSS Score 11       Quality of Life due to urinary symptoms   If you were to spend the rest of your life with your urinary condition just the way it is now how would you feel about that? Mixed           PMH: Past Medical History:  Diagnosis Date  . Benign hypertension with chronic kidney disease  04/24/2015  . Chronic kidney disease   . CKD (chronic kidney disease), stage III ([place]) 04/24/2015  . Diabetes ([place]) 04/24/2015  . Diabetes mellitus without complication ([place])   . Gout 04/24/2015  . Hyperlipidemia   . Hypertension     Surgical History: Past Surgical History:  Procedure Laterality Date  . partial thumb amputation      Home Medications:  Allergies as of 07/24/2020   No Known Allergies     Medication List       Accurate as of July 24, 2020  1:16 PM. If you have any questions, ask your nurse or doctor.        amLODipine 10 MG tablet Commonly known as: NORVASC Take 1 tablet (10 mg total) by mouth daily.   aspirin 81 MG tablet Take 81 mg by mouth daily.   finasteride 5 MG tablet Commonly known as: PROSCAR Take 1 tablet (5 mg total) by mouth daily.   Fish Oil 1000 MG Caps Take 1,000 mg by mouth daily.   lisinopril-hydrochlorothiazide 20-12.5 MG tablet Commonly known as: ZESTORETIC Take 1 tablet by mouth daily.   metFORMIN 500  MG tablet Commonly known as: GLUCOPHAGE Take 1 tablet (500 mg total) by mouth 2 (two) times daily with a meal.   multivitamin tablet Take 1 tablet by mouth daily.   rosuvastatin 20 MG tablet Commonly known as: CRESTOR Take 1 tablet (20 mg total) by mouth daily.   Vitamin D-3 25 MCG (1000 UT) Caps Take 1,000 Units by mouth daily.       Allergies:  No Known Allergies  Family History: Family History  Problem Relation Age of Onset  . Cancer Mother        lung  . Hypertension Mother   . Diabetes Father   . Hypertension Father   . Kidney disease Father   . Kidney disease Brother   . Hypertension Sister   . Hypertension Sister   . Hypertension Sister     Social History:   reports that he quit smoking about 15 years ago. He has never used smokeless tobacco. He reports current alcohol use of about 6.0 standard drinks of alcohol per week. He reports that he does not use drugs.  Physical Exam: BP 125/75 (BP  Location: Left Arm, Patient Position: Sitting, Cuff Size: Normal)   Pulse 79   Ht 5\' 10"  (1.778 m)   Wt 218 lb (98.9 kg)   BMI 31.28 kg/m   Constitutional:  Alert and oriented, no acute distress, nontoxic appearing HEENT: [place], AT Cardiovascular: No clubbing, cyanosis, or edema Respiratory: Normal respiratory effort, no increased work of breathing GU: Normal sphincter tone.  Smooth, symmetrically enlarged 50+ cc prostate without nodules or induration. Skin: No rashes, bruises or suspicious lesions Neurologic: Grossly intact, no focal deficits, moving all 4 extremities Psychiatric: Normal mood and affect  Laboratory Data: Results for orders placed or performed in visit on 07/24/20  Bladder Scan (Post Void Residual) in office  Result Value Ref Range   Scan Result 40mL    Assessment & Plan:   1. Benign prostatic hyperplasia with urinary obstruction Increasing IPSS, however stable bother over the past year.  Will defer further pharmacotherapy at this time.  Most significantly, his PSA has risen 1.2 over the past year on finasteride and remained stable at this level.  I had a lengthy conversation with the patient in clinic today explaining that this represents quite a significant increase and that I recommend a prostate biopsy at this time, especially given his possible family history of prostate cancer.  Patient reports hesitation to proceed with this.  I we will plan to call him next week to discuss this further.  Ultimately, may offer prostate MRI as an alternative if he remains unwilling to proceed with biopsy. - Bladder Scan (Post Void Residual) in office  Return in about 1 year (around 07/24/2021) for Annual DRE/IPSS/PVR with PSA prior.  Debroah Loop, PA-C  [facility] [address], [place], [place] [address] [phone]

## 2020-08-04 ENCOUNTER — Telehealth: Payer: Self-pay | Admitting: Physician Assistant

## 2020-08-04 NOTE — Telephone Encounter (Signed)
I just spoke with the patient via telephone.  I reiterated my recommendation that he undergo a prostate biopsy given that his PSA has risen 1.2 over the past year and stayed stable despite continued finasteride use.  As an alternative, I offered him a prostate MRI.  Patient asked which method of evaluation would be more accurate.  I explained that a prostate biopsy would be the more accurate test.  He is in agreement with biopsy.  Please contact the patient and schedule him for a prostate biopsy with Dr. Erlene Quan.  Patient prefers after Christmas.  I am in agreement with this plan

## 2020-08-04 NOTE — Telephone Encounter (Addendum)
Reviewed procedure in detail with patient, answered all questions. Voiced understanding. Mailed instructions and appointment reminder.

## 2020-08-08 LAB — HM DIABETES EYE EXAM

## 2020-08-14 ENCOUNTER — Other Ambulatory Visit: Payer: Self-pay | Admitting: Urology

## 2020-09-06 ENCOUNTER — Ambulatory Visit (INDEPENDENT_AMBULATORY_CARE_PROVIDER_SITE_OTHER): Payer: Medicare HMO | Admitting: Urology

## 2020-09-06 ENCOUNTER — Other Ambulatory Visit: Payer: Self-pay

## 2020-09-06 ENCOUNTER — Encounter: Payer: Self-pay | Admitting: Urology

## 2020-09-06 VITALS — BP 153/81 | HR 73 | Wt 218.0 lb

## 2020-09-06 DIAGNOSIS — N138 Other obstructive and reflux uropathy: Secondary | ICD-10-CM

## 2020-09-06 DIAGNOSIS — R972 Elevated prostate specific antigen [PSA]: Secondary | ICD-10-CM

## 2020-09-06 DIAGNOSIS — N401 Enlarged prostate with lower urinary tract symptoms: Secondary | ICD-10-CM

## 2020-09-06 MED ORDER — GENTAMICIN SULFATE 40 MG/ML IJ SOLN
80.0000 mg | Freq: Once | INTRAMUSCULAR | Status: AC
  Administered 2020-09-06: 80 mg via INTRAMUSCULAR

## 2020-09-06 MED ORDER — LEVOFLOXACIN 500 MG PO TABS
500.0000 mg | ORAL_TABLET | Freq: Once | ORAL | Status: AC
  Administered 2020-09-06: 500 mg via ORAL

## 2020-09-06 NOTE — Progress Notes (Signed)
   09/06/20  CC:  Chief Complaint  Patient presents with  . Prostate Biopsy    HPI: 66 year old male who presents today for prostate biopsy.  Please see previous notes for details.  Blood pressure (!) 153/81, pulse 73, weight 218 lb (98.9 kg). NED. A&Ox3.   No respiratory distress   Abd soft, NT, ND Normal sphincter tone  Prostate Biopsy Procedure   Informed consent was obtained after discussing risks/benefits of the procedure.  A time out was performed to ensure correct patient identity.  Pre-Procedure: - Gentamicin given prophylactically - Levaquin 500 mg administered PO -Transrectal Ultrasound performed revealing a 46.25 gm prostate -No significant hypoechoic or median lobe noted  Procedure: - Prostate block performed using 10 cc 1% lidocaine and biopsies taken from sextant areas, a total of 12 under ultrasound guidance.  Post-Procedure: - Patient tolerated the procedure well - He was counseled to seek immediate medical attention if experiences any severe pain, significant bleeding, or fevers - Return in one week to discuss biopsy results    Hollice Espy, MD

## 2020-09-08 ENCOUNTER — Other Ambulatory Visit: Payer: Self-pay | Admitting: Nurse Practitioner

## 2020-09-08 LAB — SURGICAL PATHOLOGY

## 2020-09-13 ENCOUNTER — Other Ambulatory Visit: Payer: Self-pay

## 2020-09-13 ENCOUNTER — Encounter: Payer: Self-pay | Admitting: Urology

## 2020-09-13 ENCOUNTER — Ambulatory Visit (INDEPENDENT_AMBULATORY_CARE_PROVIDER_SITE_OTHER): Payer: Medicare HMO | Admitting: Urology

## 2020-09-13 VITALS — BP 144/84 | HR 85

## 2020-09-13 DIAGNOSIS — N138 Other obstructive and reflux uropathy: Secondary | ICD-10-CM | POA: Diagnosis not present

## 2020-09-13 DIAGNOSIS — N401 Enlarged prostate with lower urinary tract symptoms: Secondary | ICD-10-CM

## 2020-09-13 DIAGNOSIS — C61 Malignant neoplasm of prostate: Secondary | ICD-10-CM | POA: Diagnosis not present

## 2020-09-13 NOTE — Progress Notes (Signed)
09/13/2020 11:29 AM   Fernando Frederick 1956-07-17 [id number]  Referring provider: Venita Lick, NP [address] [place],  [place] [address]  Chief Complaint  Patient presents with   Results    HPI: 65 year old male who presents today to discuss new diagnosis of prostate cancer.  He underwent prostate biopsy on 09/06/2020.  This revealed Gleason 3+3 involving 4 of 12 cores up to 100% of the tissue, at the left mid and apex.  Preprocedure PSA 11/21 was 3.2 on finasteride.  He has a family history of prostate cancer, brother.  TRUS volume 46.25 g.  He is on finasteride chronically.  Did not tolerate Flomax in the past.   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   Chronic kidney disease    CKD (chronic kidney disease), stage III ([place]) 04/24/2015   Diabetes ([place]) 04/24/2015   Diabetes mellitus without complication ([place])    Gout 04/24/2015   Hyperlipidemia    Hypertension     Surgical History: Past Surgical History:  Procedure Laterality Date   partial thumb amputation      Home Medications:  Allergies as of 09/13/2020   No Known Allergies     Medication List       Accurate as of September 13, 2020 11:29 AM. If you have any questions, ask your nurse or doctor.        amLODipine 10 MG tablet Commonly known as: NORVASC Take 1 tablet (10 mg total) by mouth daily.   aspirin 81 MG tablet Take 81 mg by mouth daily.   finasteride 5 MG tablet Commonly known as: PROSCAR Take 1 tablet (5 mg total) by mouth daily.   Fish Oil 1000 MG Caps Take 1,000 mg by mouth daily.   lisinopril-hydrochlorothiazide 20-12.5 MG tablet Commonly known as: ZESTORETIC Take 1 tablet by mouth daily.   metFORMIN 500 MG tablet Commonly known as: GLUCOPHAGE Take 1 tablet (500 mg total) by mouth 2 (two) times daily with a meal.   multivitamin tablet Take 1 tablet by mouth daily.   rosuvastatin 20 MG  tablet Commonly known as: CRESTOR Take 1 tablet (20 mg total) by mouth daily.   Vitamin D-3 25 MCG (1000 UT) Caps Take 1,000 Units by mouth daily.       Allergies: No Known Allergies  Family History: Family History  Problem Relation Age of Onset   Cancer Mother        lung   Hypertension Mother    Diabetes Father    Hypertension Father    Kidney disease Father    Kidney disease Brother    Hypertension Sister    Hypertension Sister    Hypertension Sister     Social History:  reports that he quit smoking about 16 years ago. He has never used smokeless tobacco. He reports current alcohol use of about 6.0 standard drinks of alcohol per week. He reports that he does not use drugs.   Physical Exam: BP (!) 144/84    Pulse 85   Constitutional:  Alert and oriented, No acute distress. HEENT: [place] AT, moist mucus membranes.  Trachea midline, no masses. Cardiovascular: No clubbing, cyanosis, or edema. Respiratory: Normal respiratory effort, no increased work of breathing. Skin: No rashes, bruises or suspicious lesions. Neurologic: Grossly intact, no focal deficits, moving all 4 extremities. Psychiatric: Normal mood and affect.   Assessment & Plan:    1. Prostate cancer [facility]) Newly diagnosed Gleason 3+3 low  risk prostate cancer  The patient was counseled about the natural history of prostate cancer and the standard treatment options that are available for prostate cancer. It was explained to him how his age and life expectancy, clinical stage, Gleason score, and PSA affect his prognosis, the decision to proceed with additional staging studies, as well as how that information influences recommended treatment strategies. We discussed the roles for active surveillance, radiation therapy, surgical therapy, androgen deprivation, as well as ablative therapy options for the treatment of prostate cancer as appropriate to his individual cancer situation. We discussed the risks and  benefits of these options with regard to their impact on cancer control and also in terms of potential adverse events, complications, and impact on quality of life particularly related to urinary, bowel, and sexual function. The patient was encouraged to ask questions throughout the discussion today and all questions were answered to his stated satisfaction. In addition, the patient was provided with and/or directed to appropriate resources and literature for further education about prostate cancer treatment options.  At this point, most strongly recommend active surveillance.  Given the relatively high volume of disease, I do recommend prostate MRI in 6 months to serve as a baseline and also assess for any higher grade or more aggressive lesions or disease outside of the prostate/locally advanced.  He is agreeable this plan.  He is also interested in overall health and lifestyle changes to promote prostate health.  We discussed exercise, weight loss and dietary recommendations.  He was given book on healthy prostate cancer lifestyle.  Follow-up in 6 months with PSA/prostate MRI   - PSA; Future - MR PROSTATE W WO CONTRAST; Future  2. Benign prostatic hyperplasia with urinary obstruction Long history of obstructive urinary symptoms currently on finasteride only  Recommend continuation of this medication at least for the time being, he is agreeable this plan   Hollice Espy, MD  [facility] [address] [place], [place] [address] [phone]  I spent 40 total minutes on the day of the encounter including pre-visit review of the medical record, face-to-face time with the patient, and post visit ordering of labs/imaging/tests.

## 2020-10-27 ENCOUNTER — Encounter: Payer: Self-pay | Admitting: Nurse Practitioner

## 2020-10-27 DIAGNOSIS — C61 Malignant neoplasm of prostate: Secondary | ICD-10-CM | POA: Insufficient documentation

## 2020-10-31 ENCOUNTER — Encounter: Payer: Self-pay | Admitting: Nurse Practitioner

## 2020-10-31 ENCOUNTER — Ambulatory Visit (INDEPENDENT_AMBULATORY_CARE_PROVIDER_SITE_OTHER): Payer: Medicare HMO | Admitting: Nurse Practitioner

## 2020-10-31 ENCOUNTER — Other Ambulatory Visit: Payer: Self-pay

## 2020-10-31 VITALS — BP 125/86 | HR 77 | Temp 98.8°F | Wt 219.0 lb

## 2020-10-31 DIAGNOSIS — E1169 Type 2 diabetes mellitus with other specified complication: Secondary | ICD-10-CM | POA: Diagnosis not present

## 2020-10-31 DIAGNOSIS — N1831 Chronic kidney disease, stage 3a: Secondary | ICD-10-CM | POA: Diagnosis not present

## 2020-10-31 DIAGNOSIS — I152 Hypertension secondary to endocrine disorders: Secondary | ICD-10-CM

## 2020-10-31 DIAGNOSIS — E785 Hyperlipidemia, unspecified: Secondary | ICD-10-CM

## 2020-10-31 DIAGNOSIS — Z87891 Personal history of nicotine dependence: Secondary | ICD-10-CM

## 2020-10-31 DIAGNOSIS — E669 Obesity, unspecified: Secondary | ICD-10-CM

## 2020-10-31 DIAGNOSIS — Z1211 Encounter for screening for malignant neoplasm of colon: Secondary | ICD-10-CM

## 2020-10-31 DIAGNOSIS — E1121 Type 2 diabetes mellitus with diabetic nephropathy: Secondary | ICD-10-CM

## 2020-10-31 DIAGNOSIS — E1159 Type 2 diabetes mellitus with other circulatory complications: Secondary | ICD-10-CM

## 2020-10-31 LAB — BAYER DCA HB A1C WAIVED: HB A1C (BAYER DCA - WAIVED): 6.1 % (ref <7.0)

## 2020-10-31 LAB — MICROALBUMIN, URINE WAIVED
Creatinine, Urine Waived: 300 mg/dL (ref 10–300)
Microalb, Ur Waived: 30 mg/L — ABNORMAL HIGH (ref 0–19)
Microalb/Creat Ratio: <30 mg/g (ref <30)

## 2020-10-31 NOTE — Assessment & Plan Note (Signed)
Chronic, ongoing with BP below goal today.  Recommend he check BP occasionally at home and document & focus on DASH diet. Continue current medication regimen and adjust as needed.  Urine ALB 30 today, continue Lisinopril for kidney protection.  CMP today.  Could consider discontinuation of HCTZ if kidney function remains lower side and increase Lisinopril as needed for BP. Return in 6 months.

## 2020-10-31 NOTE — Assessment & Plan Note (Signed)
Chronic, ongoing. Continue Lisinopril for kidney protection.  CMP today, urine ALB 30.  Consider nephrology referral if decline noted.  Could consider discontinuation of HCTZ if decline noted and then increase Lisinopril as needed for BP control.  Also monitor Metformin and kidneys, may need reduction or discontinuation in future.

## 2020-10-31 NOTE — Progress Notes (Signed)
BP 125/86   Pulse 77   Temp 98.8 F (37.1 C) (Oral)   Wt 219 lb (99.3 kg)   BMI 31.42 kg/m    Subjective:    Patient ID: Fernando Frederick, male    DOB: 1955-10-27, 65 y.o.   MRN: [id number]  HPI: Fernando Frederick is a 65 y.o. male  Chief Complaint  Patient presents with  . Diabetes  . Hyperlipidemia  . Hypertension  . Follow-up    Patient denies having any problems or concerns at today's visit.   DIABETES Last A1C was 5.8% September. Continues on Metformin 500 MG BID.  Continues focus on exercise. Hypoglycemic episodes:no Polydipsia/polyuria:no Visual disturbance:no Chest pain:no Paresthesias:no Glucose Monitoring:yes Accucheck frequency: rarely Fasting glucose: last check was 99 -- averaging 90-112  Post prandial: Evening: Before meals: Taking Insulin?:no Long acting insulin: Short acting insulin: Blood Pressure Monitoring:not checking Retinal Examination:Not Up To Date -- Dr. Gloriann Loan performed 2 months ago Foot Exam:Up to Date Pneumovax:Up to Date  Influenza:Up to Date Aspirin:yes  HYPERTENSION / HYPERLIPIDEMIA Current Amlodipine, Lisinopril-HCTZ and Crestor. Satisfied with current treatment?yes Duration of hypertension:chronic BP monitoring frequency:not checking BP range:  BP medication side effects:no Duration of hyperlipidemia:chronic Cholesterol medication side effects:no Cholesterol supplements: none Medication compliance:good compliance Aspirin:yes Recent stressors:no Recurrent headaches:no Visual changes:no Palpitations:no Dyspnea:no Chest pain:no Lower extremity edema:no Dizzy/lightheaded:no  CHRONIC KIDNEY DISEASE Continues on Lisinopril for kidney protection. Last CRT 1.92 and GFR 41.  Last saw urology on September 13, 2020 for Prostate CA -- continue to monitor without treatment at this time -- to return to them for MRI in 6  months. CKD status:stable Medications renally dose:yes Previous renal evaluation:no Pneumovax:Up to Date Influenza Vaccine:Up to Date  Relevant past medical, surgical, family and social history reviewed and updated as indicated. Interim medical history since our last visit reviewed. Allergies and medications reviewed and updated.  Review of Systems  Constitutional: Negative for activity change, diaphoresis, fatigue and fever.  Respiratory: Negative for cough, chest tightness, shortness of breath and wheezing.   Cardiovascular: Negative for chest pain, palpitations and leg swelling.  Gastrointestinal: Negative.   Endocrine: Negative for cold intolerance, heat intolerance, polydipsia, polyphagia and polyuria.  Musculoskeletal: Negative.   Skin: Negative.   Neurological: Negative.   Psychiatric/Behavioral: Negative.     Per HPI unless specifically indicated above     Objective:    BP 125/86   Pulse 77   Temp 98.8 F (37.1 C) (Oral)   Wt 219 lb (99.3 kg)   BMI 31.42 kg/m   Wt Readings from Last 3 Encounters:  10/31/20 219 lb (99.3 kg)  09/06/20 218 lb (98.9 kg)  07/24/20 218 lb (98.9 kg)    Physical Exam Vitals and nursing note reviewed.  Constitutional:      General: He is awake. He is not in acute distress.    Appearance: He is well-developed and well-groomed. He is obese. He is not ill-appearing.  HENT:     Head: Normocephalic and atraumatic.     Right Ear: Hearing normal. No drainage.     Left Ear: Hearing normal. No drainage.  Eyes:     General: Lids are normal.        Right eye: No discharge.        Left eye: No discharge.     Conjunctiva/sclera: Conjunctivae normal.     Pupils: Pupils are equal, round, and reactive to light.  Neck:     Thyroid: No thyromegaly.     Vascular: No carotid  bruit.     Trachea: Trachea normal.  Cardiovascular:     Rate and Rhythm: Normal rate and regular rhythm.     Heart sounds: Normal heart sounds, S1 normal and S2  normal. No murmur heard. No gallop.   Pulmonary:     Effort: Pulmonary effort is normal. No accessory muscle usage or respiratory distress.     Breath sounds: Normal breath sounds.  Abdominal:     General: Bowel sounds are normal.     Palpations: Abdomen is soft.  Musculoskeletal:        General: Normal range of motion.     Cervical back: Normal range of motion and neck supple.     Right lower leg: No edema.     Left lower leg: No edema.  Skin:    General: Skin is warm and dry.     Capillary Refill: Capillary refill takes less than 2 seconds.  Neurological:     Mental Status: He is alert and oriented to person, place, and time.     Deep Tendon Reflexes: Reflexes are normal and symmetric.  Psychiatric:        Attention and Perception: Attention normal.        Mood and Affect: Mood normal.        Speech: Speech normal.        Behavior: Behavior normal. Behavior is cooperative.        Thought Content: Thought content normal.     Results for orders placed or performed in visit on 09/06/20  Surgical pathology  Result Value Ref Range   SURGICAL PATHOLOGY      SURGICAL PATHOLOGY CASE: [id number] PATIENT: [facility] Surgical Pathology Report     Specimen Submitted: A. PROSTATE, LEFT BASE B. PROSTATE, LEFT MID C. PROSTATE, LEFT APEX D. PROSTATE, RIGHT BASE E. PROSTATE, RIGHT MID F. PROSTATE, RIGHT APEX G. PROSTATE, LEFT LATERAL BASE H. PROSTATE, LEFT LATERAL MID I. PROSTATE, LEFT LATERAL APEX J. PROSTATE, RIGHT LATERAL BASE K. PROSTATE, RIGHT LATERAL MID L. PROSTATE, RIGHT LATERAL APEX  Clinical History: Elevated PSA R97.20      DIAGNOSIS: Diagnostic Map   Benign  Atypical  Malignant  HGPIN     Diagnostic Summary  [A] PROSTATE, LEFT BASE:   SMALL FOCUS OF ATYPICAL GLANDS.  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  [D] PROSTATE, RIGHT BASE:   NEGATIVE FOR MALIGNANCY.  [E] PROSTATE, RIGHT MID:   NEGATIVE FOR [place] GNANCY.  [F] PROSTATE, RIGHT APEX:   NEGATIVE FOR MALIGNANCY.  [G] PROSTATE, LEFT LATERAL BASE:   NEGATIVE FOR MALIGNANCY.  [H] PROSTATE, LEFT LATERAL MID:   ACINAR ADENOCARCINOMA, GLEASON 3+3=6 (GG 1), INVOLVING 1 CORES, MEASURING 10  MM ( 100%).  [I] PROSTATE, LEFT LATERAL APEX:   ACINAR ADENOCARCINOMA, GLEASON 3+3=6 (GG 1), INVOLVING 1 CORES, MEASURING 4  MM ( 40%).  [J] PROSTATE, RIGHT LATERAL BASE:   NEGATIVE FOR MALIGNANCY.  [K] PROSTATE, RIGHT LATERAL MID:   NEGATIVE FOR MALIGNANCY.  [L] PROSTATE, RIGHT LATERAL APEX:   NEGATIVE FOR MALIGNANCY.      GROSS DESCRIPTION: A. Labeled: Left base Received: Formalin Collection time: 4:21 PM on 09/06/2020 Placed into formalin time: 4:21 PM on 09/06/2020 Number of needle core biopsy(s): 1 Length: 1.5 cm Diameter: 0.1 cm Description: Needle  core biopsy fragment of tan soft tissue Ink: Green Entirely submitted in 1 cassette.  B. Labeled: Left mid Received: Formalin Collection time: 4:21 PM on 09/06/2020 Placed into  formalin time: 4:21 PM on 09/06/2020 Number of needle core biopsy(s): 1 Length: 1.7 cm Diameter: 0.1 cm Description: Needle core biopsy fragment of tan soft tissue Ink: Green Entirely submitted in 1 cassette.  C. Labeled: Left apex Received: Formalin Collection time: 4:21 PM on 09/06/2020 Placed into formalin time: 4:21 PM on 09/06/2020 Number of needle core biopsy(s): 1 Length: 1.2 cm Diameter: 0.1 cm Description: Needle core biopsy fragment of tan soft tissue Ink: Green Entirely submitted in 1 cassette.  D. Labeled: Right base Received: Formalin Collection time: 4:21 PM on 09/06/2020 Placed into formalin time: 4:21 PM on 09/06/2020 Number of needle core biopsy(s): 1 Length: 1.6 cm Diameter: 0.1 cm Description: Needle core biopsy fragment of tan soft  tissue Ink: Green Entirely submitted in 1 cassette.  E. Labeled: Right mid Received: Formalin Collection time: 4:21 PM on 09/06/2020 Placed into formalin time: 4:21 PM on 09/06/2020 Number of needle core biopsy (s): 1 Length: 1.7 cm Diameter: 0.1 cm Description: Needle core biopsy fragment of tan soft tissue Ink: Green Entirely submitted in 1 cassette.  F. Labeled: Right apex Received: Formalin Collection time: 4:21 PM on 09/06/2020 Placed into formalin time: 4:21 PM on 09/06/2020 Number of needle core biopsy(s): 1 Length: 2 cm Diameter: 0.1 cm Description: Needle core biopsy fragment of tan soft tissue Ink: Green Entirely submitted in 1 cassette.  G. Labeled: Left lateral base Received: Formalin Collection time: 4:21 PM on 09/06/2020 Placed into formalin time: 4:21 PM on 09/06/2020 Number of needle core biopsy(s): 1 Length: 1.2 cm Diameter: 0.1 cm Description: Needle core biopsy fragment of tan soft tissue Ink: Green Entirely submitted in 1 cassette.  H. Labeled: Left lateral mid Received: Formalin Collection time:4:21 PM on 09/06/2020 Placed into formalin time: 4:21 PM on 09/06/2020 Number of needle core biopsy(s): 2 Length: Range from 0.2-1.4 cm Diameter: 0.1 c m Description: Needle core biopsy fragment of tan soft tissue Ink: Green Entirely submitted in 1 cassette.  I. Labeled: Left lateral apex Received: Formalin Collection time: 4:21 PM on 09/06/2020 Placed into formalin time: 4:21 PM on 09/06/2020 Number of needle core biopsy(s): 1 Length: 1.7 cm Diameter: 0.1 cm Description: Needle core biopsy fragment of tan soft tissue Ink: Green Entirely submitted in 1 cassette.  J. Labeled: Right lateral base Received: Formalin Collection time: 4:21 PM on 09/06/2020 Placed into formalin time: 4:21 PM on 09/06/2020 Number of needle core biopsy(s): 2 Length: Range from 0.1-0.9 cm Diameter: 0.1 cm Description: Needle core biopsy fragment of tan soft tissue Ink:  Green Entirely submitted in 1 cassette.  K. Labeled: Right lateral mid Received: Formalin Collection time: 4:21 PM on 09/06/2020 Placed into formalin time: 4:21 PM on 09/06/2020 Number of needle core biopsy(s): 1 Length: 1.8 cm Diameter: 0.1 cm Description: Needle core  biopsy fragment of tan soft tissue Ink: Green Entirely submitted in 1 cassette.  L. Labeled: Right lateral apex Received: Formalin Collection time: 4:21 PM on 09/06/2020 Placed into formalin time: 4:21 PM on 09/06/2020 Number of needle core biopsy(s): 1 Length: 1.4 cm Diameter: 0.1 cm Description: Needle core biopsy fragment of tan soft tissue Ink: Green Entirely submitted in 1 cassette.  Final Diagnosis performed by Betsy Pries, MD.   Electronically signed 09/08/2020 1:12:34PM The electronic signature indicates that the named Attending Pathologist has evaluated the specimen Technical component performed at [facility],  [address], [place], [place] [address] Lab: [phone] Dir: Rush Farmer, MD, MMM  Professional component performed at [facility], [facility], [place], [place], [place] [address] Lab: [phone] Dir: Dellia Nims. Rubinas, MD       Assessment & Plan:   Problem List Items Addressed This Visit      Cardiovascular and Mediastinum   Hypertension associated with diabetes ([place])    Chronic, ongoing with BP below goal today.  Recommend he check BP occasionally at home and document & focus on DASH diet. Continue current medication regimen and adjust as needed.  Urine ALB 30 today, continue Lisinopril for kidney protection.  CMP today.  Could consider discontinuation of HCTZ if kidney function remains lower side and increase Lisinopril as needed for BP. Return in 6 months.      Relevant Orders   Comprehensive metabolic panel   Bayer DCA Hb A1c Waived   Microalbumin, Urine Waived     Endocrine   Hyperlipidemia associated with type 2 diabetes mellitus (HCC)    Chronic,  ongoing.  Continue current medication regimen and adjust as needed.  Lipid panel today.  Return in 6 months.      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  Recent A1C below goal at 6.1% today and urine ALB 30.  Continue current medication regimen, including Lisinopril for kidney protection.  Recommend he continue to check BS a few times a week at home.  Return in 6 months for physical.  May consider reduction of Metformin to once a day if continued good control and weight loss.      Relevant Orders   Comprehensive metabolic panel   Bayer DCA Hb A1c Waived   Microalbumin, Urine Waived     Genitourinary   CKD (chronic kidney disease), stage III (HCC)    Chronic, ongoing. Continue Lisinopril for kidney protection.  CMP today, urine ALB 30.  Consider nephrology referral if decline noted.  Could consider discontinuation of HCTZ if decline noted and then increase Lisinopril as needed for BP control.  Also monitor Metformin and kidneys, may need reduction or discontinuation in future.      Relevant Orders   Comprehensive metabolic panel   Bayer DCA Hb A1c Waived   Microalbumin, Urine Waived     Other   Obesity (BMI 30-39.9)    Recommended eating smaller high protein, low fat meals more frequently and exercising 30 mins a day 5 times a week with a goal of 10-15lb weight loss in the next 3 months. Patient voiced their understanding and motivation to adhere to these recommendations.       History of smoking    Order for one time AAA screening per guideline recommendations last visit, this was normal.  Continue to monitor and recommend continued cessation.       Other Visit Diagnoses    Colon cancer screening       GI referral placed.   Relevant Orders   Ambulatory referral to Gastroenterology       Follow up plan: Return in about 6 months (around 05/03/2021) for Annual  physical.

## 2020-10-31 NOTE — Assessment & Plan Note (Signed)
Chronic, ongoing.  Continue current medication regimen and adjust as needed.  Lipid panel today.  Return in 6 months. 

## 2020-10-31 NOTE — Assessment & Plan Note (Signed)
Recommended eating smaller high protein, low fat meals more frequently and exercising 30 mins a day 5 times a week with a goal of 10-15lb weight loss in the next 3 months. Patient voiced their understanding and motivation to adhere to these recommendations.  

## 2020-10-31 NOTE — Assessment & Plan Note (Signed)
Chronic, ongoing.  Recent A1C below goal at 6.1% today and urine ALB 30.  Continue current medication regimen, including Lisinopril for kidney protection.  Recommend he continue to check BS a few times a week at home.  Return in 6 months for physical.  May consider reduction of Metformin to once a day if continued good control and weight loss.

## 2020-10-31 NOTE — Patient Instructions (Signed)
Diabetes Mellitus and Nutrition, Adult When you have diabetes, or diabetes mellitus, it is very important to have healthy eating habits because your blood sugar (glucose) levels are greatly affected by what you eat and drink. Eating healthy foods in the right amounts, at about the same times every day, can help you:  Control your blood glucose.  Lower your risk of heart disease.  Improve your blood pressure.  Reach or maintain a healthy weight. What can affect my meal plan? Every person with diabetes is different, and each person has different needs for a meal plan. Your health care provider may recommend that you work with a dietitian to make a meal plan that is best for you. Your meal plan may vary depending on factors such as:  The calories you need.  The medicines you take.  Your weight.  Your blood glucose, blood pressure, and cholesterol levels.  Your activity level.  Other health conditions you have, such as heart or kidney disease. How do carbohydrates affect me? Carbohydrates, also called carbs, affect your blood glucose level more than any other type of food. Eating carbs naturally raises the amount of glucose in your blood. Carb counting is a method for keeping track of how many carbs you eat. Counting carbs is important to keep your blood glucose at a healthy level, especially if you use insulin or take certain oral diabetes medicines. It is important to know how many carbs you can safely have in each meal. This is different for every person. Your dietitian can help you calculate how many carbs you should have at each meal and for each snack. How does alcohol affect me? Alcohol can cause a sudden decrease in blood glucose (hypoglycemia), especially if you use insulin or take certain oral diabetes medicines. Hypoglycemia can be a life-threatening condition. Symptoms of hypoglycemia, such as sleepiness, dizziness, and confusion, are similar to symptoms of having too much  alcohol.  Do not drink alcohol if: ? Your health care provider tells you not to drink. ? You are pregnant, may be pregnant, or are planning to become pregnant.  If you drink alcohol: ? Do not drink on an empty stomach. ? Limit how much you use to:  0-1 drink a day for women.  0-2 drinks a day for men. ? Be aware of how much alcohol is in your drink. In the [place] one drink equals one 12 oz bottle of beer (355 mL), one 5 oz glass of wine (148 mL), or one 1 oz glass of hard liquor (44 mL). ? Keep yourself hydrated with water, diet soda, or unsweetened iced tea.  Keep in mind that regular soda, juice, and other mixers may contain a lot of sugar and must be counted as carbs. What are tips for following this plan? Reading food labels  Start by checking the serving size on the "Nutrition Facts" label of packaged foods and drinks. The amount of calories, carbs, fats, and other nutrients listed on the label is based on one serving of the item. Many items contain more than one serving per package.  Check the total grams (g) of carbs in one serving. You can calculate the number of servings of carbs in one serving by dividing the total carbs by 15. For example, if a food has 30 g of total carbs per serving, it would be equal to 2 servings of carbs.  Check the number of grams (g) of saturated fats and trans fats in one serving. Choose foods that have   a low amount or none of these fats.  Check the number of milligrams (mg) of salt (sodium) in one serving. Most people should limit total sodium intake to less than 2,300 mg per day.  Always check the nutrition information of foods labeled as "low-fat" or "nonfat." These foods may be higher in added sugar or refined carbs and should be avoided.  Talk to your dietitian to identify your daily goals for nutrients listed on the label. Shopping  Avoid buying canned, pre-made, or processed foods. These foods tend to be high in fat, sodium, and added  sugar.  Shop around the outside edge of the grocery store. This is where you will most often find fresh fruits and vegetables, bulk grains, fresh meats, and fresh dairy. Cooking  Use low-heat cooking methods, such as baking, instead of high-heat cooking methods like deep frying.  Cook using healthy oils, such as olive, canola, or sunflower oil.  Avoid cooking with butter, cream, or high-fat meats. Meal planning  Eat meals and snacks regularly, preferably at the same times every day. Avoid going long periods of time without eating.  Eat foods that are high in fiber, such as fresh fruits, vegetables, beans, and whole grains. Talk with your dietitian about how many servings of carbs you can eat at each meal.  Eat 4-6 oz (112-168 g) of lean protein each day, such as lean meat, chicken, fish, eggs, or tofu. One ounce (oz) of lean protein is equal to: ? 1 oz (28 g) of meat, chicken, or fish. ? 1 egg. ?  cup (62 g) of tofu.  Eat some foods each day that contain healthy fats, such as avocado, nuts, seeds, and fish.   What foods should I eat? Fruits Berries. Apples. Oranges. Peaches. Apricots. Plums. Grapes. Mango. Papaya. Pomegranate. Kiwi. Cherries. Vegetables Lettuce. Spinach. Leafy greens, including kale, chard, collard greens, and mustard greens. Beets. Cauliflower. Cabbage. Broccoli. Carrots. Green beans. Tomatoes. Peppers. Onions. Cucumbers. Brussels sprouts. Grains Whole grains, such as whole-wheat or whole-grain bread, crackers, tortillas, cereal, and pasta. Unsweetened oatmeal. Quinoa. Brown or wild rice. Meats and other proteins Seafood. Poultry without skin. Lean cuts of poultry and beef. Tofu. Nuts. Seeds. Dairy Low-fat or fat-free dairy products such as milk, yogurt, and cheese. The items listed above may not be a complete list of foods and beverages you can eat. Contact a dietitian for more information. What foods should I avoid? Fruits Fruits canned with  syrup. Vegetables Canned vegetables. Frozen vegetables with butter or cream sauce. Grains Refined white flour and flour products such as bread, pasta, snack foods, and cereals. Avoid all processed foods. Meats and other proteins Fatty cuts of meat. Poultry with skin. Breaded or fried meats. Processed meat. Avoid saturated fats. Dairy Full-fat yogurt, cheese, or milk. Beverages Sweetened drinks, such as soda or iced tea. The items listed above may not be a complete list of foods and beverages you should avoid. Contact a dietitian for more information. Questions to ask a health care provider  Do I need to meet with a diabetes educator?  Do I need to meet with a dietitian?  What number can I call if I have questions?  When are the best times to check my blood glucose? Where to find more information:  American Diabetes Association: diabetes.org  Academy of Nutrition and Dietetics: www.eatright.org  National Institute of Diabetes and Digestive and Kidney Diseases: www.niddk.nih.gov  Association of Diabetes Care and Education Specialists: www.diabeteseducator.org Summary  It is important to have healthy eating   habits because your blood sugar (glucose) levels are greatly affected by what you eat and drink.  A healthy meal plan will help you control your blood glucose and maintain a healthy lifestyle.  Your health care provider may recommend that you work with a dietitian to make a meal plan that is best for you.  Keep in mind that carbohydrates (carbs) and alcohol have immediate effects on your blood glucose levels. It is important to count carbs and to use alcohol carefully. This information is not intended to replace advice given to you by your health care provider. Make sure you discuss any questions you have with your health care provider. Document Revised: 07/20/2019 Document Reviewed: 07/20/2019 Elsevier Patient Education  2021 Elsevier Inc.  

## 2020-10-31 NOTE — Assessment & Plan Note (Signed)
Order for one time AAA screening per guideline recommendations last visit, this was normal.  Continue to monitor and recommend continued cessation.

## 2020-11-01 LAB — LIPID PANEL W/O CHOL/HDL RATIO
Cholesterol, Total: 132 mg/dL (ref 100–199)
HDL: 31 mg/dL — ABNORMAL LOW (ref >39)
LDL Chol Calc (NIH): 52 mg/dL (ref 0–99)
Triglycerides: 321 mg/dL — ABNORMAL HIGH (ref 0–149)
VLDL Cholesterol Cal: 49 mg/dL — ABNORMAL HIGH (ref 5–40)

## 2020-11-01 LAB — COMPREHENSIVE METABOLIC PANEL
ALT: 23 IU/L (ref 0–44)
AST: 25 IU/L (ref 0–40)
Albumin/Globulin Ratio: 1.7 (ref 1.2–2.2)
Albumin: 4.7 g/dL (ref 3.8–4.8)
Alkaline Phosphatase: 89 IU/L (ref 44–121)
BUN/Creatinine Ratio: 11 (ref 10–24)
BUN: 19 mg/dL (ref 8–27)
Bilirubin Total: 0.3 mg/dL (ref 0.0–1.2)
CO2: 22 mmol/L (ref 20–29)
Calcium: 9.5 mg/dL (ref 8.6–10.2)
Chloride: 105 mmol/L (ref 96–106)
Creatinine, Ser: 1.75 mg/dL — ABNORMAL HIGH (ref 0.76–1.27)
Globulin, Total: 2.8 g/dL (ref 1.5–4.5)
Glucose: 99 mg/dL (ref 65–99)
Potassium: 4.3 mmol/L (ref 3.5–5.2)
Sodium: 145 mmol/L — ABNORMAL HIGH (ref 134–144)
Total Protein: 7.5 g/dL (ref 6.0–8.5)
eGFR: 42 mL/min/{1.73_m2} — ABNORMAL LOW (ref >59)

## 2020-11-01 NOTE — Progress Notes (Signed)
Contacted via MyChart   Good evening [place] your labs have returned.  You continue to show some baseline kidney disease stage 3b with no significant decline, we will continue to monitor this at visits and adjust medications accordingly.  If ever any decline or worsening would send you to kidney specialist.  Sodium level was a little high, I recommend increasing water intake which is good for kidneys and decreasing salt intake, will recheck next visit.  Cholesterol levels show LDL at goal, but triglycerides slightly elevated.  Were they completely fasting for this visit?  If so, continue fish oil 1g  and Rosuvastatin daily and recheck next visit. Also should reduce saturated fats and sugars in diet.  Any questions? Keep being awesome!!  Thank you for allowing me to participate in your care. Kindest regards, Jolene

## 2020-11-10 ENCOUNTER — Other Ambulatory Visit: Payer: Self-pay

## 2020-11-10 ENCOUNTER — Telehealth (INDEPENDENT_AMBULATORY_CARE_PROVIDER_SITE_OTHER): Payer: Self-pay | Admitting: Gastroenterology

## 2020-11-10 DIAGNOSIS — Z1211 Encounter for screening for malignant neoplasm of colon: Secondary | ICD-10-CM

## 2020-11-10 MED ORDER — NA SULFATE-K SULFATE-MG SULF 17.5-3.13-1.6 GM/177ML PO SOLN: 1.0000 | Freq: Once | ORAL | 0 refills | Status: AC

## 2020-11-10 NOTE — Progress Notes (Signed)
Gastroenterology Pre-Procedure Review  Request Date: Friday 11/24/20 Requesting Physician: Dr. Vicente Males  PATIENT REVIEW QUESTIONS: The patient responded to the following health history questions as indicated:    1. Are you having any GI issues? no 2. Do you have a personal history of Polyps? Pt stated yes 10 years ago 3. Do you have a family history of Colon Cancer or Polyps? no 4. Diabetes Mellitus? yes (type 2) 5. Joint replacements in the past 12 months?no 6. Major health problems in the past 3 months?no 7. Any artificial heart valves, MVP, or defibrillator?no    MEDICATIONS & ALLERGIES:    Patient reports the following regarding taking any anticoagulation/antiplatelet therapy:   Plavix, Coumadin, Eliquis, Xarelto, Lovenox, Pradaxa, Brilinta, or Effient? no Aspirin? yes (81 mg daily)  Patient confirms/reports the following medications:  Current Outpatient Medications  Medication Sig Dispense Refill  . amLODipine (NORVASC) 10 MG tablet Take 1 tablet (10 mg total) by mouth daily. 30 tablet 0  . aspirin 81 MG tablet Take 81 mg by mouth daily.    . Cholecalciferol (VITAMIN D-3) 1000 UNITS CAPS Take 1,000 Units by mouth daily.    . finasteride (PROSCAR) 5 MG tablet Take 1 tablet (5 mg total) by mouth daily. 30 tablet 11  . lisinopril-hydrochlorothiazide (ZESTORETIC) 20-12.5 MG tablet Take 1 tablet by mouth daily. 30 tablet 0  . metFORMIN (GLUCOPHAGE) 500 MG tablet Take 1 tablet (500 mg total) by mouth 2 (two) times daily with a meal. 180 tablet 4  . Multiple Vitamin (MULTIVITAMIN) tablet Take 1 tablet by mouth daily.    . Omega-3 Fatty Acids (FISH OIL) 1000 MG CAPS Take 1,000 mg by mouth daily.    . rosuvastatin (CRESTOR) 20 MG tablet Take 1 tablet (20 mg total) by mouth daily. 90 tablet 1   No current facility-administered medications for this visit.    Patient confirms/reports the following allergies:  No Known Allergies  No orders of the defined types were placed in this  encounter.   AUTHORIZATION INFORMATION Primary Insurance: 1D#: Group #:  Secondary Insurance: 1D#: Group #:  SCHEDULE INFORMATION: Date: Friday 11/24/20 Time: Location:[facility]

## 2020-11-13 ENCOUNTER — Other Ambulatory Visit: Payer: Self-pay

## 2020-11-13 ENCOUNTER — Telehealth: Payer: Self-pay

## 2020-11-13 NOTE — Progress Notes (Signed)
Rescheduled procedure and updated instructions have been sent via my chart and mailed.Endo unit notified of changes.

## 2020-11-13 NOTE — Telephone Encounter (Signed)
Patient called to reschedule procedure. Endo unit has been notified of changes. Updated instructions have been sent.

## 2020-12-08 ENCOUNTER — Encounter: Payer: Self-pay | Admitting: Gastroenterology

## 2020-12-08 ENCOUNTER — Ambulatory Visit
Admission: RE | Admit: 2020-12-08 | Discharge: 2020-12-08 | Disposition: A | Discharge status: Home / Self Care | Payer: Medicare HMO | Attending: Gastroenterology | Admitting: Gastroenterology

## 2020-12-08 ENCOUNTER — Ambulatory Visit: Payer: Medicare HMO | Admitting: Certified Registered Nurse Anesthetist

## 2020-12-08 ENCOUNTER — Encounter
Admission: RE | Disposition: A | Discharge status: Home / Self Care | Payer: Self-pay | Source: Home / Self Care | Attending: Gastroenterology

## 2020-12-08 DIAGNOSIS — Z79899 Other long term (current) drug therapy: Secondary | ICD-10-CM | POA: Insufficient documentation

## 2020-12-08 DIAGNOSIS — Z89019 Acquired absence of unspecified thumb: Secondary | ICD-10-CM | POA: Insufficient documentation

## 2020-12-08 DIAGNOSIS — K5730 Diverticulosis of large intestine without perforation or abscess without bleeding: Secondary | ICD-10-CM | POA: Insufficient documentation

## 2020-12-08 DIAGNOSIS — Z1211 Encounter for screening for malignant neoplasm of colon: Secondary | ICD-10-CM | POA: Diagnosis not present

## 2020-12-08 DIAGNOSIS — K635 Polyp of colon: Secondary | ICD-10-CM | POA: Insufficient documentation

## 2020-12-08 DIAGNOSIS — E785 Hyperlipidemia, unspecified: Secondary | ICD-10-CM | POA: Insufficient documentation

## 2020-12-08 DIAGNOSIS — Z7984 Long term (current) use of oral hypoglycemic drugs: Secondary | ICD-10-CM | POA: Diagnosis not present

## 2020-12-08 DIAGNOSIS — Z8249 Family history of ischemic heart disease and other diseases of the circulatory system: Secondary | ICD-10-CM | POA: Diagnosis not present

## 2020-12-08 DIAGNOSIS — E1122 Type 2 diabetes mellitus with diabetic chronic kidney disease: Secondary | ICD-10-CM | POA: Insufficient documentation

## 2020-12-08 DIAGNOSIS — Z87891 Personal history of nicotine dependence: Secondary | ICD-10-CM | POA: Insufficient documentation

## 2020-12-08 DIAGNOSIS — K621 Rectal polyp: Secondary | ICD-10-CM | POA: Diagnosis not present

## 2020-12-08 DIAGNOSIS — Z7982 Long term (current) use of aspirin: Secondary | ICD-10-CM | POA: Diagnosis not present

## 2020-12-08 DIAGNOSIS — N183 Chronic kidney disease, stage 3 unspecified: Secondary | ICD-10-CM | POA: Insufficient documentation

## 2020-12-08 DIAGNOSIS — I129 Hypertensive chronic kidney disease with stage 1 through stage 4 chronic kidney disease, or unspecified chronic kidney disease: Secondary | ICD-10-CM | POA: Diagnosis not present

## 2020-12-08 DIAGNOSIS — Z841 Family history of disorders of kidney and ureter: Secondary | ICD-10-CM | POA: Insufficient documentation

## 2020-12-08 HISTORY — PX: COLONOSCOPY WITH PROPOFOL: SHX5780

## 2020-12-08 LAB — GLUCOSE, CAPILLARY: Glucose-Capillary: 99 mg/dL (ref 70–99)

## 2020-12-08 SURGERY — COLONOSCOPY WITH PROPOFOL
Anesthesia: General

## 2020-12-08 MED ORDER — SODIUM CHLORIDE 0.9 % IV SOLN: INTRAVENOUS | Status: DC

## 2020-12-08 MED ORDER — PROPOFOL 500 MG/50ML IV EMUL
INTRAVENOUS | Status: AC
  Filled 2020-12-08: qty 50

## 2020-12-08 MED ORDER — PROPOFOL 500 MG/50ML IV EMUL
INTRAVENOUS | Status: DC | PRN
  Administered 2020-12-08: 100 ug/kg/min via INTRAVENOUS

## 2020-12-08 MED ORDER — LIDOCAINE HCL (CARDIAC) PF 100 MG/5ML IV SOSY
PREFILLED_SYRINGE | INTRAVENOUS | Status: DC | PRN
  Administered 2020-12-08: 50 mg via INTRAVENOUS

## 2020-12-08 MED ORDER — PROPOFOL 10 MG/ML IV BOLUS
INTRAVENOUS | Status: DC | PRN
  Administered 2020-12-08 (×2): 50 mg via INTRAVENOUS

## 2020-12-08 MED ORDER — PROPOFOL 10 MG/ML IV BOLUS
INTRAVENOUS | Status: AC
  Filled 2020-12-08: qty 40

## 2020-12-08 MED ORDER — LACTATED RINGERS IV SOLN: INTRAVENOUS | Status: DC | PRN

## 2020-12-08 NOTE — Anesthesia Postprocedure Evaluation (Signed)
Anesthesia Post Note  Patient: Fernando Frederick  Procedure(s) Performed: COLONOSCOPY WITH PROPOFOL (N/A )  Patient location during evaluation: PACU Anesthesia Type: General Level of consciousness: awake and alert Pain management: pain level controlled Vital Signs Assessment: post-procedure vital signs reviewed and stable Respiratory status: spontaneous breathing, nonlabored ventilation and respiratory function stable Cardiovascular status: blood pressure returned to baseline and stable Postop Assessment: no apparent nausea or vomiting Anesthetic complications: no   No complications documented.   Last Vitals:  Vitals:   12/08/20 0835 12/08/20 0845  BP: 118/83 123/88  Pulse: 65 62  Resp: 16 15  Temp:    SpO2: 99% 99%    Last Pain:  Vitals:   12/08/20 0845  TempSrc:   PainSc: 0-No pain                 Brett Canales Danna Casella

## 2020-12-08 NOTE — Transfer of Care (Signed)
Immediate Anesthesia Transfer of Care Note  Patient: TAYSHAUN KROH  Procedure(s) Performed: COLONOSCOPY WITH PROPOFOL (N/A )  Patient Location: PACU  Anesthesia Type:General  Level of Consciousness: drowsy  Airway & Oxygen Therapy: Patient Spontanous Breathing  Post-op Assessment: Report given to RN and Post -op Vital signs reviewed and stable  Post vital signs: Reviewed and stable  Last Vitals:  Vitals Value Taken Time  BP    Temp 35.6 C 12/08/20 0825  Pulse 62 12/08/20 0825  Resp 13 12/08/20 0825  SpO2 98 % 12/08/20 0825    Last Pain:  Vitals:   12/08/20 0825  TempSrc: Temporal  PainSc:          Complications: No complications documented.

## 2020-12-08 NOTE — H&P (Signed)
Fernando Lame, MD [address] [place], [place] [address] Phone: [phone] Fax : [phone]  Primary Care Physician:  Venita Lick, NP Primary Gastroenterologist:  Dr. Allen Norris  Pre-Procedure History & Physical: HPI:  Fernando Frederick is a 65 y.o. male is here for a screening colonoscopy.   Past Medical History:  Diagnosis Date  . Benign hypertension with chronic kidney disease 04/24/2015  . Chronic kidney disease   . CKD (chronic kidney disease), stage III ([place]) 04/24/2015  . Diabetes ([place]) 04/24/2015  . Diabetes mellitus without complication ([place])   . Gout 04/24/2015  . Hyperlipidemia   . Hypertension     Past Surgical History:  Procedure Laterality Date  . partial thumb amputation    . PROSTATE SURGERY     Prostate Biopsy    Prior to Admission medications   Medication Sig Start Date End Date Taking? Authorizing Provider  amLODipine (NORVASC) 10 MG tablet Take 1 tablet (10 mg total) by mouth daily. 09/08/20   Marnee Guarneri T, NP  aspirin 81 MG tablet Take 81 mg by mouth daily.    [provider]  Cholecalciferol (VITAMIN D-3) 1000 UNITS CAPS Take 1,000 Units by mouth daily.    [provider]  finasteride (PROSCAR) 5 MG tablet Take 1 tablet (5 mg total) by mouth daily. 08/14/20   Vaillancourt, Aldona Bar, PA-C  lisinopril-hydrochlorothiazide (ZESTORETIC) 20-12.5 MG tablet Take 1 tablet by mouth daily. 09/08/20   Cannady, Henrine Screws T, NP  metFORMIN (GLUCOPHAGE) 500 MG tablet Take 1 tablet (500 mg total) by mouth 2 (two) times daily with a meal. 05/03/20   Cannady, Henrine Screws T, NP  Multiple Vitamin (MULTIVITAMIN) tablet Take 1 tablet by mouth daily.    [provider]  Omega-3 Fatty Acids (FISH OIL) 1000 MG CAPS Take 1,000 mg by mouth daily.    [provider]  rosuvastatin (CRESTOR) 20 MG tablet Take 1 tablet (20 mg total) by mouth daily. 06/07/20   Marnee Guarneri T, NP    Allergies as of 11/10/2020  . (No Known Allergies)     Family History  Problem Relation Age of Onset  . Cancer Mother        lung  . Hypertension Mother   . Diabetes Father   . Hypertension Father   . Kidney disease Father   . Kidney disease Brother   . Hypertension Sister   . Hypertension Sister   . Hypertension Sister     Social History   Socioeconomic History  . Marital status: Married    Spouse name: Not on file  . Number of children: Not on file  . Years of education: Not on file  . Highest education level: Not on file  Occupational History  . Not on file  Tobacco Use  . Smoking status: Former Smoker    Types: Cigarettes    Quit date: 08/26/2004    Years since quitting: 16.2  . Smokeless tobacco: Never Used  Vaping Use  . Vaping Use: Never used  Substance and Sexual Activity  . Alcohol use: Yes    Alcohol/week: 6.0 standard drinks    Types: 6 Cans of beer per week    Comment: ocassionally   . Drug use: No  . Sexual activity: Yes  Other Topics Concern  . Not on file  Social History Narrative  . Not on file   Social Determinants of Health   Financial Resource Strain: Not on file  Food Insecurity: Not on file  Transportation Needs:  Not on file  Physical Activity: Not on file  Stress: Not on file  Social Connections: Not on file  Intimate Partner Violence: Not on file    Review of Systems: See HPI, otherwise negative ROS  Physical Exam: BP 128/85   Pulse 73   Temp (!) 96.3 F (35.7 C) (Temporal)   Resp 15   Ht 5\' 10"  (1.778 m)   Wt 99.8 kg   SpO2 100%   BMI 31.57 kg/m  General:   Alert,  pleasant and cooperative in NAD Head:  Normocephalic and atraumatic. Neck:  Supple; no masses or thyromegaly. Lungs:  Clear throughout to auscultation.    Heart:  Regular rate and rhythm. Abdomen:  Soft, nontender and nondistended. Normal bowel sounds, without guarding, and without rebound.   Neurologic:  Alert and  oriented x4;  grossly normal neurologically.  Impression/Plan: [name] is now here  to undergo a screening colonoscopy.  Risks, benefits, and alternatives regarding colonoscopy have been reviewed with the patient.  Questions have been answered.  All parties agreeable.

## 2020-12-08 NOTE — Anesthesia Preprocedure Evaluation (Addendum)
Anesthesia Evaluation  Patient identified by MRN, date of birth, ID band Patient awake    Reviewed: Allergy & Precautions, H&P , NPO status , Patient's Chart, lab work & pertinent test results  History of Anesthesia Complications Negative for: history of anesthetic complications  Airway Mallampati: II  TM Distance: >3 FB     Dental  (+) Missing   Pulmonary neg sleep apnea, neg COPD, former smoker,    breath sounds clear to auscultation       Cardiovascular hypertension, (-) angina(-) Past MI and (-) Cardiac Stents (-) dysrhythmias  Rhythm:regular Rate:Normal     Neuro/Psych negative neurological ROS  negative psych ROS   GI/Hepatic negative GI ROS, Neg liver ROS,   Endo/Other  diabetes  Renal/GU Renal disease (CKD)  negative genitourinary   Musculoskeletal   Abdominal   Peds  Hematology negative hematology ROS (+)   Anesthesia Other Findings Past Medical History: 04/24/2015: Benign hypertension with chronic kidney disease No date: Chronic kidney disease 04/24/2015: CKD (chronic kidney disease), stage III ([place]) 04/24/2015: Diabetes ([place]) No date: Diabetes mellitus without complication ([place]) 5/63/1497: Gout No date: Hyperlipidemia No date: Hypertension  Past Surgical History: No date: partial thumb amputation No date: PROSTATE SURGERY     Comment:  Prostate Biopsy  BMI    Body Mass Index: 31.57 kg/m      Reproductive/Obstetrics negative OB ROS                            Anesthesia Physical Anesthesia Plan  ASA: II  Anesthesia Plan: General   Post-op Pain Management:    Induction:   PONV Risk Score and Plan: Propofol infusion and TIVA  Airway Management Planned: Nasal Cannula  Additional Equipment:   Intra-op Plan:   Post-operative Plan:   Informed Consent: I have reviewed the patients History and Physical, chart, labs and discussed the procedure including the  risks, benefits and alternatives for the proposed anesthesia with the patient or authorized representative who has indicated his/her understanding and acceptance.     Dental Advisory Given  Plan Discussed with: Anesthesiologist, CRNA and Surgeon  Anesthesia Plan Comments:         Anesthesia Quick Evaluation

## 2020-12-08 NOTE — Op Note (Signed)
[facility] Gastroenterology Patient Name: Fernando Frederick Procedure Date: 12/08/2020 7:26 AM MRN: [id number] Account #: [id number] Date of Birth: Mar 30, 1956 Admit Type: Outpatient Age: 66 Room: [facility] ENDO ROOM 4 Gender: Male Note Status: Finalized Procedure:             Colonoscopy Indications:           Screening for colorectal malignant neoplasm Providers:             Lucilla Lame MD, MD Medicines:             Propofol per Anesthesia Complications:         No immediate complications. Procedure:             Pre-Anesthesia Assessment:                        - Prior to the procedure, a History and Physical was                         performed, and patient medications and allergies were                         reviewed. The patient's tolerance of previous                         anesthesia was also reviewed. The risks and benefits                         of the procedure and the sedation options and risks                         were discussed with the patient. All questions were                         answered, and informed consent was obtained. Prior                         Anticoagulants: The patient has taken no previous                         anticoagulant or antiplatelet agents. ASA Grade                         Assessment: II - A patient with mild systemic disease.                         After reviewing the risks and benefits, the patient                         was deemed in satisfactory condition to undergo the                         procedure.                        After obtaining informed consent, the colonoscope was                         passed under direct vision. Throughout the procedure,  the patient's blood pressure, pulse, and oxygen                         saturations were monitored continuously. The                         Colonoscope was introduced through the anus and                         advanced to the the cecum,  identified by appendiceal                         orifice and ileocecal valve. The colonoscopy was                         performed without difficulty. The patient tolerated                         the procedure well. The quality of the bowel                         preparation was excellent. Findings:      The perianal and digital rectal examinations were normal.      Two sessile polyps were found in the transverse colon. The polyps were 3       to 4 mm in size. These polyps were removed with a cold snare. Resection       and retrieval were complete.      Two sessile polyps were found in the descending colon. The polyps were 5       to 6 mm in size. These polyps were removed with a cold snare. Resection       and retrieval were complete.      A 3 mm polyp was found in the rectum. The polyp was sessile. The polyp       was removed with a cold snare. Resection and retrieval were complete.      A 15 mm polyp was found in the rectum. The polyp was sessile.       Preparations were made for mucosal resection. Chromoscopy with indigo       carmine was done to mark the borders of the lesion. Indigo carmine was       injected to raise the lesion. Snare mucosal resection was performed.       Resection and retrieval were complete. To prevent bleeding       post-intervention, two hemostatic clips were successfully placed (MR       conditional). There was no bleeding at the end of the procedure.      Multiple small-mouthed diverticula were found in the entire colon. Impression:            - Two 3 to 4 mm polyps in the transverse colon,                         removed with a cold snare. Resected and retrieved.                        - Two 5 to 6 mm polyps in the descending colon,  removed with a cold snare. Resected and retrieved.                        - One 3 mm polyp in the rectum, removed with a cold                         snare. Resected and retrieved.                         - One 15 mm polyp in the rectum, removed with mucosal                         resection. Resected and retrieved. Clips (MR                         conditional) were placed.                        - Diverticulosis in the entire examined colon.                        - Mucosal resection was performed. Resection and                         retrieval were complete. Recommendation:        - Discharge patient to home.                        - Resume previous diet.                        - Continue present medications.                        - Await pathology results.                        - Repeat colonoscopy in 3 months to check healing. Procedure Code(s):     --- Professional ---                        [phone], Colonoscopy, flexible; with endoscopic mucosal                         resection                        [id number], 39, Colonoscopy, flexible; with removal of                         tumor(s), polyp(s), or other lesion(s) by snare                         technique Diagnosis Code(s):     --- Professional ---                        Z12.11, Encounter for screening for malignant neoplasm                         of colon  K63.5, Polyp of colon                        K62.1, Rectal polyp CPT copyright 2019 American Medical Association. All rights reserved. The codes documented in this report are preliminary and upon coder review may  be revised to meet current compliance requirements. Lucilla Lame MD, MD 12/08/2020 8:25:07 AM This report has been signed electronically. Number of Addenda: 0 Note Initiated On: 12/08/2020 7:26 AM Scope Withdrawal Time: 0 hours 23 minutes 19 seconds  Total Procedure Duration: 0 hours 27 minutes 10 seconds  Estimated Blood Loss:  Estimated blood loss: none.      [facility]

## 2020-12-11 ENCOUNTER — Encounter: Payer: Self-pay | Admitting: Gastroenterology

## 2020-12-11 LAB — SURGICAL PATHOLOGY

## 2020-12-14 ENCOUNTER — Other Ambulatory Visit: Payer: Self-pay | Admitting: Nurse Practitioner

## 2020-12-20 ENCOUNTER — Telehealth: Payer: Self-pay

## 2020-12-20 NOTE — Telephone Encounter (Signed)
-----   Message from Lucilla Lame, MD sent at 12/11/2020  5:34 PM EDT ----- Please have the patient come in for a follow up.

## 2020-12-20 NOTE — Telephone Encounter (Signed)
Pt returned my call and has been scheduled for a follow up appt.

## 2020-12-20 NOTE — Telephone Encounter (Signed)
LVM for pt to return my call to schedule follow up colonoscopy procedure results.

## 2020-12-21 ENCOUNTER — Ambulatory Visit: Payer: Medicare HMO | Admitting: Gastroenterology

## 2020-12-21 ENCOUNTER — Encounter: Payer: Self-pay | Admitting: Gastroenterology

## 2020-12-21 ENCOUNTER — Other Ambulatory Visit: Payer: Self-pay

## 2020-12-21 VITALS — BP 113/69 | HR 83 | Ht 70.0 in | Wt 215.4 lb

## 2020-12-21 DIAGNOSIS — K621 Rectal polyp: Secondary | ICD-10-CM

## 2020-12-21 MED ORDER — PEG 3350-KCL-NA BICARB-NACL 420 G PO SOLR: ORAL | 0 refills | Status: DC

## 2020-12-21 NOTE — Progress Notes (Signed)
Primary Care Physician: Venita Lick, NP  Primary Gastroenterologist:  Dr. Lucilla Lame  Chief Complaint  Patient presents with  . Follow up Colonoscopy result    HPI: Fernando Frederick is a 65 y.o. male here who comes to me after having a colonoscopy with a large polyp in the rectum.  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  The patient had been set up for a repeat procedure to look at the area but I had him come in to review and discuss the findings on this polyp. The patient has had no problems since the procedure.  He states he has been doing well and just here for follow-up of his polyp pathology.  Past Medical History:  Diagnosis Date  . Benign hypertension with chronic kidney disease 04/24/2015  . Chronic kidney disease   . CKD (chronic kidney disease), stage III ([place]) 04/24/2015  . Diabetes ([place]) 04/24/2015  . Diabetes mellitus without complication ([place])   . Gout 04/24/2015  . Hyperlipidemia   . Hypertension     Current Outpatient Medications  Medication Sig Dispense Refill  . amLODipine (NORVASC) 10 MG tablet Take 1 tablet (10 mg total) by mouth daily. 30 tablet 0  . aspirin 81 MG tablet Take 81 mg by mouth daily.    . Cholecalciferol (VITAMIN D-3) 1000 UNITS CAPS Take 1,000 Units by mouth daily.    . finasteride (PROSCAR) 5 MG tablet Take 1 tablet (5 mg total) by mouth daily. 30 tablet 11  . lisinopril-hydrochlorothiazide (ZESTORETIC) 20-12.5 MG tablet Take 1 tablet by mouth daily. 30 tablet 0  . metFORMIN (GLUCOPHAGE) 500 MG tablet Take 1 tablet (500 mg total) by mouth 2 (two) times daily with a meal. 180 tablet 4  . Multiple Vitamin (MULTIVITAMIN) tablet Take 1 tablet by mouth daily.    . Omega-3 Fatty Acids (FISH OIL) 1000 MG CAPS Take 1,000 mg by mouth daily.    . rosuvastatin (CRESTOR) 20 MG tablet Take 1 tablet (20 mg total) by mouth daily. 90 tablet 1   No current facility-administered medications for this visit.    Allergies as of 12/21/2020  .  (No Known Allergies)    ROS:  General: Negative for anorexia, weight loss, fever, chills, fatigue, weakness. ENT: Negative for hoarseness, difficulty swallowing , nasal congestion. CV: Negative for chest pain, angina, palpitations, dyspnea on exertion, peripheral edema.  Respiratory: Negative for dyspnea at rest, dyspnea on exertion, cough, sputum, wheezing.  GI: See history of present illness. GU:  Negative for dysuria, hematuria, urinary incontinence, urinary frequency, nocturnal urination.  Endo: Negative for unusual weight change.    Physical Examination:   BP 113/69   Pulse 83   Ht 5\' 10"  (1.778 m)   Wt 215 lb 6.4 oz (97.7 kg)   BMI 30.91 kg/m   General: Well-nourished, well-developed in no acute distress.  Eyes: No icterus. Conjunctivae pink. Neuro: Alert and oriented x 3.  Grossly intact. Skin: Warm and dry, no jaundice.   Psych: Alert and cooperative, normal mood and affect.  Labs:    Imaging Studies: No results found.  Assessment and Plan:   Fernando Frederick is a 65 y.o. 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  The patient has been told that he should have this area looked again at within the next few months.  The patient has agreed with that and will be set  up for a repeat procedure to assess the patient for any residual adenomatous tissue.  The patient has been explained the plan and agrees with the it.     Lucilla Lame, MD. Marval Regal    Note: This dictation was prepared with Dragon dictation along with smaller phrase technology. Any transcriptional errors that result from this process are unintentional.

## 2021-02-20 ENCOUNTER — Encounter: Payer: Self-pay | Admitting: Gastroenterology

## 2021-02-20 ENCOUNTER — Ambulatory Visit: Payer: Medicare HMO | Admitting: Certified Registered"

## 2021-02-20 ENCOUNTER — Encounter
Admission: RE | Disposition: A | Discharge status: Home / Self Care | Payer: Self-pay | Source: Home / Self Care | Attending: Gastroenterology

## 2021-02-20 ENCOUNTER — Other Ambulatory Visit: Payer: Self-pay

## 2021-02-20 ENCOUNTER — Ambulatory Visit
Admission: RE | Admit: 2021-02-20 | Discharge: 2021-02-20 | Disposition: A | Discharge status: Home / Self Care | Payer: Medicare HMO | Attending: Gastroenterology | Admitting: Gastroenterology

## 2021-02-20 DIAGNOSIS — K621 Rectal polyp: Secondary | ICD-10-CM

## 2021-02-20 DIAGNOSIS — Z801 Family history of malignant neoplasm of trachea, bronchus and lung: Secondary | ICD-10-CM | POA: Insufficient documentation

## 2021-02-20 DIAGNOSIS — Z7984 Long term (current) use of oral hypoglycemic drugs: Secondary | ICD-10-CM | POA: Insufficient documentation

## 2021-02-20 DIAGNOSIS — Z8249 Family history of ischemic heart disease and other diseases of the circulatory system: Secondary | ICD-10-CM | POA: Diagnosis not present

## 2021-02-20 DIAGNOSIS — Z79899 Other long term (current) drug therapy: Secondary | ICD-10-CM | POA: Diagnosis not present

## 2021-02-20 DIAGNOSIS — K635 Polyp of colon: Secondary | ICD-10-CM

## 2021-02-20 DIAGNOSIS — Z87891 Personal history of nicotine dependence: Secondary | ICD-10-CM | POA: Diagnosis not present

## 2021-02-20 DIAGNOSIS — N183 Chronic kidney disease, stage 3 unspecified: Secondary | ICD-10-CM | POA: Diagnosis not present

## 2021-02-20 DIAGNOSIS — D122 Benign neoplasm of ascending colon: Secondary | ICD-10-CM | POA: Insufficient documentation

## 2021-02-20 DIAGNOSIS — K5730 Diverticulosis of large intestine without perforation or abscess without bleeding: Secondary | ICD-10-CM | POA: Insufficient documentation

## 2021-02-20 DIAGNOSIS — Z1211 Encounter for screening for malignant neoplasm of colon: Secondary | ICD-10-CM | POA: Diagnosis not present

## 2021-02-20 DIAGNOSIS — Z7982 Long term (current) use of aspirin: Secondary | ICD-10-CM | POA: Insufficient documentation

## 2021-02-20 DIAGNOSIS — Z860100 Personal history of colon polyps, unspecified: Secondary | ICD-10-CM

## 2021-02-20 DIAGNOSIS — I129 Hypertensive chronic kidney disease with stage 1 through stage 4 chronic kidney disease, or unspecified chronic kidney disease: Secondary | ICD-10-CM | POA: Insufficient documentation

## 2021-02-20 DIAGNOSIS — Z833 Family history of diabetes mellitus: Secondary | ICD-10-CM | POA: Insufficient documentation

## 2021-02-20 DIAGNOSIS — E1122 Type 2 diabetes mellitus with diabetic chronic kidney disease: Secondary | ICD-10-CM | POA: Diagnosis not present

## 2021-02-20 HISTORY — PX: COLONOSCOPY WITH PROPOFOL: SHX5780

## 2021-02-20 LAB — GLUCOSE, CAPILLARY: Glucose-Capillary: 99 mg/dL (ref 70–99)

## 2021-02-20 SURGERY — COLONOSCOPY WITH PROPOFOL
Anesthesia: General

## 2021-02-20 MED ORDER — SODIUM CHLORIDE 0.9 % IV SOLN: INTRAVENOUS | Status: DC

## 2021-02-20 MED ORDER — PROPOFOL 10 MG/ML IV BOLUS
INTRAVENOUS | Status: DC | PRN
  Administered 2021-02-20: 80 mg via INTRAVENOUS

## 2021-02-20 MED ORDER — PROPOFOL 500 MG/50ML IV EMUL
INTRAVENOUS | Status: AC
  Filled 2021-02-20: qty 50

## 2021-02-20 MED ORDER — PROPOFOL 500 MG/50ML IV EMUL
INTRAVENOUS | Status: DC | PRN
  Administered 2021-02-20: 150 ug/kg/min via INTRAVENOUS

## 2021-02-20 NOTE — Op Note (Signed)
[facility] Gastroenterology Patient Name: Fernando Frederick Procedure Date: 02/20/2021 7:34 AM MRN: [id number] Account #: [id number] Date of Birth: 29-Jan-1956 Admit Type: Outpatient Age: 65 Room: [facility] ENDO ROOM 2 Gender: Male Note Status: Finalized Procedure:             Colonoscopy Indications:           High risk colon cancer surveillance: Personal history                         of adenoma with high grade dysplasia Providers:             Lucilla Lame MD, MD Referring MD:          Franchot Gallo (Referring MD) Medicines:             Propofol per Anesthesia Complications:         No immediate complications. Procedure:             Pre-Anesthesia Assessment:                        - Prior to the procedure, a History and Physical was                         performed, and patient medications and allergies were                         reviewed. The patient's tolerance of previous                         anesthesia was also reviewed. The risks and benefits                         of the procedure and the sedation options and risks                         were discussed with the patient. All questions were                         answered, and informed consent was obtained. Prior                         Anticoagulants: The patient has taken no previous                         anticoagulant or antiplatelet agents. ASA Grade                         Assessment: II - A patient with mild systemic disease.                         After reviewing the risks and benefits, the patient                         was deemed in satisfactory condition to undergo the                         procedure.  After obtaining informed consent, the colonoscope was                         passed under direct vision. Throughout the procedure,                         the patient's blood pressure, pulse, and oxygen                         saturations were monitored continuously. The                          Colonoscope was introduced through the anus and                         advanced to the the cecum, identified by appendiceal                         orifice and ileocecal valve. The colonoscopy was                         performed without difficulty. The patient tolerated                         the procedure well. The quality of the bowel                         preparation was fair. Findings:      The perianal and digital rectal examinations were normal.      A 2 mm polyp was found in the ascending colon. The polyp was sessile.       The polyp was removed with a cold snare. Resection and retrieval were       complete.      A few small-mouthed diverticula were found in the entire colon.      A post polypectomy scar was found in the rectum. Impression:            - Preparation of the colon was fair.                        - One 2 mm polyp in the ascending colon, removed with                         a cold snare. Resected and retrieved.                        - Diverticulosis in the entire examined colon.                        - Post-polypectomy scar in the rectum. Recommendation:        - Discharge patient to home.                        - Resume previous diet.                        - Continue present medications.                        -  Await pathology results.                        - Repeat colonoscopy in 1 year for surveillance. Procedure Code(s):     --- Professional ---                        [phone], Colonoscopy, flexible; with removal of                         tumor(s), polyp(s), or other lesion(s) by snare                         technique Diagnosis Code(s):     --- Professional ---                        Z86.010, Personal history of colonic polyps                        K63.5, Polyp of colon CPT copyright 2019 American Medical Association. All rights reserved. The codes documented in this report are preliminary and upon coder review may  be  revised to meet current compliance requirements. Lucilla Lame MD, MD 02/20/2021 7:50:39 AM This report has been signed electronically. Number of Addenda: 0 Note Initiated On: 02/20/2021 7:34 AM Scope Withdrawal Time: 0 hours 6 minutes 52 seconds  Total Procedure Duration: 0 hours 9 minutes 37 seconds  Estimated Blood Loss:  Estimated blood loss: none.      [facility]

## 2021-02-20 NOTE — Anesthesia Preprocedure Evaluation (Signed)
Anesthesia Evaluation  Patient identified by MRN, date of birth, ID band Patient awake    Reviewed: Allergy & Precautions, H&P , NPO status , Patient's Chart, lab work & pertinent test results  History of Anesthesia Complications Negative for: history of anesthetic complications  Airway Mallampati: II  TM Distance: >3 FB     Dental  (+) Missing, Dental Advidsory Given   Pulmonary neg sleep apnea, neg COPD, former smoker,    breath sounds clear to auscultation       Cardiovascular hypertension, (-) angina(-) Past MI and (-) Cardiac Stents (-) dysrhythmias  Rhythm:regular Rate:Normal     Neuro/Psych negative neurological ROS  negative psych ROS   GI/Hepatic negative GI ROS, Neg liver ROS,   Endo/Other  diabetes  Renal/GU Renal disease (CKD)  negative genitourinary   Musculoskeletal   Abdominal   Peds  Hematology negative hematology ROS (+)   Anesthesia Other Findings Past Medical History: 04/24/2015: Benign hypertension with chronic kidney disease No date: Chronic kidney disease 04/24/2015: CKD (chronic kidney disease), stage III ([place]) 04/24/2015: Diabetes ([place]) No date: Diabetes mellitus without complication ([place]) 1/50/5697: Gout No date: Hyperlipidemia No date: Hypertension  Past Surgical History: No date: partial thumb amputation No date: PROSTATE SURGERY     Comment:  Prostate Biopsy  BMI    Body Mass Index: 31.57 kg/m      Reproductive/Obstetrics negative OB ROS                             Anesthesia Physical  Anesthesia Plan  ASA: 2  Anesthesia Plan: General   Post-op Pain Management:    Induction:   PONV Risk Score and Plan: Propofol infusion and TIVA  Airway Management Planned: Nasal Cannula  Additional Equipment:   Intra-op Plan:   Post-operative Plan:   Informed Consent: I have reviewed the patients History and Physical, chart, labs and discussed the  procedure including the risks, benefits and alternatives for the proposed anesthesia with the patient or authorized representative who has indicated his/her understanding and acceptance.     Dental Advisory Given  Plan Discussed with: Anesthesiologist, CRNA and Surgeon  Anesthesia Plan Comments:         Anesthesia Quick Evaluation

## 2021-02-20 NOTE — H&P (Signed)
Fernando Lame, MD [facility] [address], [place], [place] [address] Phone:[phone] Fax : [phone]  Primary Care Physician:  Venita Lick, NP Primary Gastroenterologist:  Dr. Allen Norris  Pre-Procedure History & Physical: HPI:  Fernando Frederick is a 65 y.o. male is here for an colonoscopy.   Past Medical History:  Diagnosis Date   Benign hypertension with chronic kidney disease 04/24/2015   Chronic kidney disease    CKD (chronic kidney disease), stage III ([place]) 04/24/2015   Diabetes ([place]) 04/24/2015   Diabetes mellitus without complication ([place])    Gout 04/24/2015   Hyperlipidemia    Hypertension     Past Surgical History:  Procedure Laterality Date   COLONOSCOPY WITH PROPOFOL N/A 12/08/2020   Procedure: COLONOSCOPY WITH PROPOFOL;  Surgeon: Fernando Lame, MD;  Location: [facility] ENDOSCOPY;  Service: Endoscopy;  Laterality: N/A;   partial thumb amputation     PROSTATE SURGERY     Prostate Biopsy    Prior to Admission medications   Medication Sig Start Date End Date Taking? Authorizing Provider  amLODipine (NORVASC) 10 MG tablet Take 1 tablet (10 mg total) by mouth daily. 09/08/20  Yes Cannady, Henrine Screws T, NP  aspirin 81 MG tablet Take 81 mg by mouth daily.   Yes [provider]  Cholecalciferol (VITAMIN D-3) 1000 UNITS CAPS Take 1,000 Units by mouth daily.   Yes [provider]  finasteride (PROSCAR) 5 MG tablet Take 1 tablet (5 mg total) by mouth daily. 08/14/20  Yes Vaillancourt, Aldona Bar, PA-C  lisinopril-hydrochlorothiazide (ZESTORETIC) 20-12.5 MG tablet Take 1 tablet by mouth daily. 09/08/20  Yes Cannady, Henrine Screws T, NP  metFORMIN (GLUCOPHAGE) 500 MG tablet Take 1 tablet (500 mg total) by mouth 2 (two) times daily with a meal. 05/03/20  Yes Cannady, Jolene T, NP  Multiple Vitamin (MULTIVITAMIN) tablet Take 1 tablet by mouth daily.   Yes [provider]  Omega-3 Fatty Acids (FISH OIL) 1000 MG CAPS Take 1,000 mg by mouth daily.   Yes [provider]  polyethylene glycol-electrolytes (GAVILYTE-N WITH FLAVOR PACK) 420 g solution Drink one 8 oz glass every 20 mins until entire container is finished starting at 5:00pm 02/19/21 12/21/20  Yes Madysen Faircloth, MD  rosuvastatin (CRESTOR) 20 MG tablet Take 1 tablet (20 mg total) by mouth daily. 12/14/20  Yes Marnee Guarneri T, NP    Allergies as of 12/22/2020   (No Known Allergies)    Family History  Problem Relation Age of Onset   Cancer Mother        lung   Hypertension Mother    Diabetes Father    Hypertension Father    Kidney disease Father    Kidney disease Brother    Hypertension Sister    Hypertension Sister    Hypertension Sister     Social History   Socioeconomic History   Marital status: Married    Spouse name: Not on file   Number of children: Not on file   Years of education: Not on file   Highest education level: Not on file  Occupational History   Not on file  Tobacco Use   Smoking status: Former    Pack years: 0.00    Types: Cigarettes    Quit date: 08/26/2004    Years since quitting: 16.4   Smokeless tobacco: Never  Vaping Use   Vaping Use: Never used  Substance and Sexual Activity   Alcohol use: Yes    Alcohol/week: 6.0 standard drinks    Types:  6 Cans of beer per week    Comment: ocassionally    Drug use: No   Sexual activity: Yes  Other Topics Concern   Not on file  Social History Narrative   Not on file   Social Determinants of Health   Financial Resource Strain: Not on file  Food Insecurity: Not on file  Transportation Needs: Not on file  Physical Activity: Not on file  Stress: Not on file  Social Connections: Not on file  Intimate Partner Violence: Not on file    Review of Systems: See HPI, otherwise negative ROS  Physical Exam: BP 130/82   Pulse 67   Temp (!) 97.5 F (36.4 C) (Temporal)   Resp 16   Ht 5\' 10"  (1.778 m)   Wt 97.5 kg   BMI 30.85 kg/m  General:   Alert,  pleasant and cooperative in NAD Head:  Normocephalic and  atraumatic. Neck:  Supple; no masses or thyromegaly. Lungs:  Clear throughout to auscultation.    Heart:  Regular rate and rhythm. Abdomen:  Soft, nontender and nondistended. Normal bowel sounds, without guarding, and without rebound.   Neurologic:  Alert and  oriented x4;  grossly normal neurologically.  Impression/Plan: [name] is here for an colonoscopy to be performed for a history of adenomatous polyps on 11/2020   Risks, benefits, limitations, and alternatives regarding  colonoscopy have been reviewed with the patient.  Questions have been answered.  All parties agreeable.   Fernando Lame, MD  02/20/2021, 7:24 AM

## 2021-02-20 NOTE — Anesthesia Postprocedure Evaluation (Signed)
Anesthesia Post Note  Patient: Fernando Frederick  Procedure(s) Performed: COLONOSCOPY WITH PROPOFOL  Patient location during evaluation: Endoscopy Anesthesia Type: General Level of consciousness: awake and alert Pain management: pain level controlled Vital Signs Assessment: post-procedure vital signs reviewed and stable Respiratory status: spontaneous breathing, nonlabored ventilation, respiratory function stable and patient connected to nasal cannula oxygen Cardiovascular status: blood pressure returned to baseline and stable Postop Assessment: no apparent nausea or vomiting Anesthetic complications: no   No notable events documented.   Last Vitals:  Vitals:   02/20/21 0802 02/20/21 0812  BP: 110/81 119/81  Pulse: 64 61  Resp: 18 16  Temp:    SpO2: 100% 100%    Last Pain:  Vitals:   02/20/21 0812  TempSrc:   PainSc: 0-No pain                 Martha Clan

## 2021-02-20 NOTE — Transfer of Care (Signed)
Immediate Anesthesia Transfer of Care Note  Patient: Fernando Frederick  Procedure(s) Performed: COLONOSCOPY WITH PROPOFOL  Patient Location: PACU and Endoscopy Unit  Anesthesia Type:General  Level of Consciousness: drowsy  Airway & Oxygen Therapy: Patient Spontanous Breathing  Post-op Assessment: Report given to RN  Post vital signs: stable  Last Vitals:  Vitals Value Taken Time  BP    Temp    Pulse    Resp    SpO2      Last Pain:  Vitals:   02/20/21 0710  TempSrc: Temporal  PainSc: 0-No pain         Complications: No notable events documented.

## 2021-02-21 ENCOUNTER — Encounter: Payer: Self-pay | Admitting: Gastroenterology

## 2021-02-21 LAB — SURGICAL PATHOLOGY

## 2021-02-22 ENCOUNTER — Encounter: Payer: Self-pay | Admitting: Gastroenterology

## 2021-03-12 ENCOUNTER — Ambulatory Visit
Admission: RE | Admit: 2021-03-12 | Discharge: 2021-03-12 | Disposition: A | Discharge status: Home / Self Care | Payer: Medicare HMO | Source: Ambulatory Visit | Attending: Urology | Admitting: Urology

## 2021-03-12 ENCOUNTER — Other Ambulatory Visit: Payer: Medicare HMO

## 2021-03-12 ENCOUNTER — Other Ambulatory Visit: Payer: Self-pay

## 2021-03-12 DIAGNOSIS — C61 Malignant neoplasm of prostate: Secondary | ICD-10-CM | POA: Insufficient documentation

## 2021-03-12 MED ORDER — GADOBUTROL 1 MMOL/ML IV SOLN
9.0000 mL | Freq: Once | INTRAVENOUS | Status: AC | PRN
  Administered 2021-03-12: 9 mL via INTRAVENOUS

## 2021-03-13 ENCOUNTER — Ambulatory Visit: Payer: Self-pay | Admitting: Urology

## 2021-03-13 LAB — PSA: Prostate Specific Ag, Serum: 3.5 ng/mL (ref 0.0–4.0)

## 2021-03-14 ENCOUNTER — Ambulatory Visit: Payer: Medicare HMO | Admitting: Urology

## 2021-03-14 ENCOUNTER — Other Ambulatory Visit: Payer: Self-pay

## 2021-03-14 VITALS — BP 147/89 | HR 102 | Ht 70.0 in | Wt 215.0 lb

## 2021-03-14 DIAGNOSIS — N401 Enlarged prostate with lower urinary tract symptoms: Secondary | ICD-10-CM | POA: Diagnosis not present

## 2021-03-14 DIAGNOSIS — C61 Malignant neoplasm of prostate: Secondary | ICD-10-CM

## 2021-03-14 DIAGNOSIS — R351 Nocturia: Secondary | ICD-10-CM

## 2021-03-14 NOTE — Progress Notes (Signed)
03/14/2021 4:28 PM   Fernando Frederick April 21, 1956 [id number]  Referring provider: Venita Lick, NP [address] [place],  [place] [address]  No chief complaint on file.   HPI: 65 year old male with low risk prostate cancer on active surveillance who returns today for 22-month follow-up with MRI/PSA.  He underwent prostate biopsy on 09/06/2020.  This revealed Gleason 3+3 involving 4 of 12 cores up to 100% of the tissue, at the left mid and apex.   Preprocedure PSA 11/21 was 3.2 on finasteride.   He has a family history of prostate cancer, brother.   TRUS volume 46.25 g.   He is on finasteride chronically.  Did not tolerate Flomax in the past.  Today, his PSA continues to rise on finasteride, 3.5, previously 3.28 months ago.  He underwent prostate MRI to serve as a baseline which shows a PI-RADS 4, nearly 5 lesion at the left posterior lateral peripheral zone in the mid gland measuring 1.4 cm in long axis.  This abutted but did not spread beyond the capsule.  There is no evidence of pelvic lymphadenopathy or metastasis.  Today in terms of urination, his only complaint is nocturia.  He gets up about 3 times at night.  He does take a bottle of water with him to bed and drinks right up to bedtime because of dry mouth issues.   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   Chronic kidney disease    CKD (chronic kidney disease), stage III ([place]) 04/24/2015   Diabetes ([place]) 04/24/2015   Diabetes mellitus without complication ([place])    Gout 04/24/2015   Hyperlipidemia    Hypertension     Surgical History: Past Surgical History:  Procedure Laterality Date   COLONOSCOPY WITH PROPOFOL N/A 12/08/2020   Procedure: COLONOSCOPY WITH PROPOFOL;  Surgeon: Lucilla Lame, MD;  Location: [facility] ENDOSCOPY;  Service: Endoscopy;  Laterality: N/A;   COLONOSCOPY WITH PROPOFOL N/A 02/20/2021   Procedure: COLONOSCOPY WITH PROPOFOL;  Surgeon: Lucilla Lame, MD;  Location: [facility] ENDOSCOPY;  Service: Endoscopy;  Laterality: N/A;   partial thumb amputation     PROSTATE SURGERY     Prostate Biopsy    Home Medications:  Allergies as of 03/14/2021   No Known Allergies      Medication List        Accurate as of March 14, 2021  4:28 PM. If you have any questions, ask your nurse or doctor.          amLODipine 10 MG tablet Commonly known as: NORVASC Take 1 tablet (10 mg total) by mouth daily.   aspirin 81 MG tablet Take 81 mg by mouth daily.   finasteride 5 MG tablet Commonly known as: PROSCAR Take 1 tablet (5 mg total) by mouth daily.   Fish Oil 1000 MG Caps Take 1,000 mg by mouth daily.   lisinopril-hydrochlorothiazide 20-12.5 MG tablet Commonly known as: ZESTORETIC Take 1 tablet by mouth daily.   metFORMIN 500 MG tablet Commonly known as: GLUCOPHAGE Take 1 tablet (500 mg total) by mouth 2 (two) times daily with a meal.   multivitamin tablet Take 1 tablet by mouth daily.   polyethylene glycol-electrolytes 420 g solution Commonly known as: GaviLyte-N with Flavor Pack Drink one 8 oz glass every 20 mins until entire container is finished starting at 5:00pm 02/19/21   rosuvastatin 20 MG tablet Commonly known as: CRESTOR Take 1 tablet (20 mg total) by mouth daily.  Vitamin D-3 25 MCG (1000 UT) Caps Take 1,000 Units by mouth daily.        Allergies: No Known Allergies  Family History: Family History  Problem Relation Age of Onset   Cancer Mother        lung   Hypertension Mother    Diabetes Father    Hypertension Father    Kidney disease Father    Kidney disease Brother    Hypertension Sister    Hypertension Sister    Hypertension Sister     Social History:  reports that he quit smoking about 16 years ago. His smoking use included cigarettes. He has never used smokeless tobacco. He reports current alcohol use of about 6.0 standard drinks of alcohol per week. He reports that he does not use  drugs.   Physical Exam: BP (!) 147/89   Pulse (!) 102   Ht 5\' 10"  (1.778 m)   Wt 215 lb (97.5 kg)   BMI 30.85 kg/m   Constitutional:  Alert and oriented, No acute distress. HEENT: [place] AT, moist mucus membranes.  Trachea midline, no masses. Cardiovascular: No clubbing, cyanosis, or edema. Respiratory: Normal respiratory effort, no increased work of breathing. Skin: No rashes, bruises or suspicious lesions. Neurologic: Grossly intact, no focal deficits, moving all 4 extremities. Psychiatric: Normal mood and affect.   Pertinent Imaging: IMPRESSION: 1. PI-RADS category 4 lesion of the left posterolateral peripheral zone in the mid gland, with low T2 signal, restricted diffusion, and early enhancement. At 1.4 cm in long axis, this is almost large enough to qualify as PI-RADS category 5. Targeting data sent to [facility] [place]. 2. Incidental findings include degenerative subcortical cyst formation in the right anterior acetabular wall, a small lipoma in the right gluteus minimus muscle, and edema/enhancement along the distal left gluteus minimus tendon.     Electronically Signed   By: Van Clines M.D.   On: 03/13/2021 11:03  Prostate MRI images were personally reviewed today, agree with radiologic interpretation.  Assessment & Plan:    1. Prostate cancer ([place]) Low risk prostate cancer on active surveillance with slowly rising PSA  Based on prostate MRI today, this corresponds quite well to his high-volume disease on the left side and likely represents the previously biopsied area.  No evidence of aggressive features including transcapsular spread or metastatic disease which is reassuring.  In light of his rising PSA and relatively high prostate cancer volume, I recommended going out and proceeding with a repeat confirmatory prostate biopsy as a precaution.  We discussed that this could be done immediately or we could wait up to 6 months repeat his PSA and see how things are going.   He would like to wait another 6 months.  We discussed pursuing this in the office using cognitive fusion biopsy especially given the relatively large size of the lesion on MRI versus proceeding with fusion biopsy [place].  Was offered both and elected cognitive fusion biopsy here in [place].  He understands the risk and benefits of this and previously did well with biopsy.  2. Benign prostatic hyperplasia with nocturia Continue finasteride, discussed behavioral modification including avoidance of beverages 4 hours before bedtime and the possible use of an oral lubricant to help with his dry mouth at night   Follow-up 6 months for PSA/DRE/prostate biopsy  Hollice Espy, MD  [facility] [address] [place], [place] [address] [phone]

## 2021-03-14 NOTE — Patient Instructions (Signed)

## 2021-03-21 ENCOUNTER — Other Ambulatory Visit: Payer: Self-pay | Admitting: Nurse Practitioner

## 2021-04-16 ENCOUNTER — Other Ambulatory Visit: Payer: Self-pay | Admitting: Nurse Practitioner

## 2021-04-27 ENCOUNTER — Encounter: Payer: Medicare HMO | Admitting: Nurse Practitioner

## 2021-05-04 ENCOUNTER — Encounter: Payer: Self-pay | Admitting: Nurse Practitioner

## 2021-05-04 ENCOUNTER — Other Ambulatory Visit: Payer: Self-pay

## 2021-05-04 ENCOUNTER — Ambulatory Visit (INDEPENDENT_AMBULATORY_CARE_PROVIDER_SITE_OTHER): Payer: Medicare HMO | Admitting: Nurse Practitioner

## 2021-05-04 VITALS — BP 116/70 | HR 74 | Temp 99.0°F | Ht 69.0 in | Wt 213.2 lb

## 2021-05-04 DIAGNOSIS — E1169 Type 2 diabetes mellitus with other specified complication: Secondary | ICD-10-CM | POA: Diagnosis not present

## 2021-05-04 DIAGNOSIS — I152 Hypertension secondary to endocrine disorders: Secondary | ICD-10-CM

## 2021-05-04 DIAGNOSIS — E785 Hyperlipidemia, unspecified: Secondary | ICD-10-CM

## 2021-05-04 DIAGNOSIS — N1831 Chronic kidney disease, stage 3a: Secondary | ICD-10-CM | POA: Diagnosis not present

## 2021-05-04 DIAGNOSIS — Z0000 Encounter for general adult medical examination without abnormal findings: Secondary | ICD-10-CM

## 2021-05-04 DIAGNOSIS — C61 Malignant neoplasm of prostate: Secondary | ICD-10-CM

## 2021-05-04 DIAGNOSIS — Z23 Encounter for immunization: Secondary | ICD-10-CM | POA: Diagnosis not present

## 2021-05-04 DIAGNOSIS — M1A0720 Idiopathic chronic gout, left ankle and foot, without tophus (tophi): Secondary | ICD-10-CM

## 2021-05-04 DIAGNOSIS — E1159 Type 2 diabetes mellitus with other circulatory complications: Secondary | ICD-10-CM | POA: Diagnosis not present

## 2021-05-04 DIAGNOSIS — E1121 Type 2 diabetes mellitus with diabetic nephropathy: Secondary | ICD-10-CM

## 2021-05-04 DIAGNOSIS — Z87891 Personal history of nicotine dependence: Secondary | ICD-10-CM

## 2021-05-04 DIAGNOSIS — E559 Vitamin D deficiency, unspecified: Secondary | ICD-10-CM

## 2021-05-04 DIAGNOSIS — E669 Obesity, unspecified: Secondary | ICD-10-CM

## 2021-05-04 LAB — BAYER DCA HB A1C WAIVED: HB A1C (BAYER DCA - WAIVED): 5.8 % — ABNORMAL HIGH (ref 4.8–5.6)

## 2021-05-04 MED ORDER — ROSUVASTATIN CALCIUM 20 MG PO TABS: 20.0000 mg | ORAL_TABLET | Freq: Every day | ORAL | 4 refills | Status: DC

## 2021-05-04 MED ORDER — AMLODIPINE BESYLATE 10 MG PO TABS: 10.0000 mg | ORAL_TABLET | Freq: Every day | ORAL | 4 refills | Status: DC

## 2021-05-04 MED ORDER — METFORMIN HCL 500 MG PO TABS: 500.0000 mg | ORAL_TABLET | Freq: Two times a day (BID) | ORAL | 4 refills | Status: DC

## 2021-05-04 MED ORDER — LISINOPRIL-HYDROCHLOROTHIAZIDE 20-12.5 MG PO TABS: 1.0000 | ORAL_TABLET | Freq: Every day | ORAL | 0 refills | Status: DC

## 2021-05-04 MED ORDER — LISINOPRIL-HYDROCHLOROTHIAZIDE 20-12.5 MG PO TABS: 1.0000 | ORAL_TABLET | Freq: Every day | ORAL | 4 refills | Status: DC

## 2021-05-04 NOTE — Assessment & Plan Note (Signed)
Chronic, stable.  Continue supplement at home and check Vit D level today. 

## 2021-05-04 NOTE — Assessment & Plan Note (Signed)
Chronic, ongoing. Continue Lisinopril for kidney protection.  CMP today, urine ALB 30 in March 2022.  Consider nephrology referral if decline noted.  Could consider discontinuation of HCTZ if decline noted and then increase Lisinopril as needed for BP control.  Also monitor Metformin and kidneys, may need reduction or discontinuation in future.

## 2021-05-04 NOTE — Patient Instructions (Addendum)
Diabetes Mellitus and Nutrition, Adult When you have diabetes, or diabetes mellitus, it is very important to have healthy eating habits because your blood sugar (glucose) levels are greatly affected by what you eat and drink. Eating healthy foods in the right amounts, at about the same times every day, can help you:  Control your blood glucose.  Lower your risk of heart disease.  Improve your blood pressure.  Reach or maintain a healthy weight. What can affect my meal plan? Every person with diabetes is different, and each person has different needs for a meal plan. Your health care provider may recommend that you work with a dietitian to make a meal plan that is best for you. Your meal plan may vary depending on factors such as:  The calories you need.  The medicines you take.  Your weight.  Your blood glucose, blood pressure, and cholesterol levels.  Your activity level.  Other health conditions you have, such as heart or kidney disease. How do carbohydrates affect me? Carbohydrates, also called carbs, affect your blood glucose level more than any other type of food. Eating carbs naturally raises the amount of glucose in your blood. Carb counting is a method for keeping track of how many carbs you eat. Counting carbs is important to keep your blood glucose at a healthy level, especially if you use insulin or take certain oral diabetes medicines. It is important to know how many carbs you can safely have in each meal. This is different for every person. Your dietitian can help you calculate how many carbs you should have at each meal and for each snack. How does alcohol affect me? Alcohol can cause a sudden decrease in blood glucose (hypoglycemia), especially if you use insulin or take certain oral diabetes medicines. Hypoglycemia can be a life-threatening condition. Symptoms of hypoglycemia, such as sleepiness, dizziness, and confusion, are similar to symptoms of having too much  alcohol.  Do not drink alcohol if: ? Your health care provider tells you not to drink. ? You are pregnant, may be pregnant, or are planning to become pregnant.  If you drink alcohol: ? Do not drink on an empty stomach. ? Limit how much you use to:  0-1 drink a day for women.  0-2 drinks a day for men. ? Be aware of how much alcohol is in your drink. In the [place] one drink equals one 12 oz bottle of beer (355 mL), one 5 oz glass of wine (148 mL), or one 1 oz glass of hard liquor (44 mL). ? Keep yourself hydrated with water, diet soda, or unsweetened iced tea.  Keep in mind that regular soda, juice, and other mixers may contain a lot of sugar and must be counted as carbs. What are tips for following this plan? Reading food labels  Start by checking the serving size on the "Nutrition Facts" label of packaged foods and drinks. The amount of calories, carbs, fats, and other nutrients listed on the label is based on one serving of the item. Many items contain more than one serving per package.  Check the total grams (g) of carbs in one serving. You can calculate the number of servings of carbs in one serving by dividing the total carbs by 15. For example, if a food has 30 g of total carbs per serving, it would be equal to 2 servings of carbs.  Check the number of grams (g) of saturated fats and trans fats in one serving. Choose foods that have   a low amount or none of these fats.  Check the number of milligrams (mg) of salt (sodium) in one serving. Most people should limit total sodium intake to less than 2,300 mg per day.  Always check the nutrition information of foods labeled as "low-fat" or "nonfat." These foods may be higher in added sugar or refined carbs and should be avoided.  Talk to your dietitian to identify your daily goals for nutrients listed on the label. Shopping  Avoid buying canned, pre-made, or processed foods. These foods tend to be high in fat, sodium, and added  sugar.  Shop around the outside edge of the grocery store. This is where you will most often find fresh fruits and vegetables, bulk grains, fresh meats, and fresh dairy. Cooking  Use low-heat cooking methods, such as baking, instead of high-heat cooking methods like deep frying.  Cook using healthy oils, such as olive, canola, or sunflower oil.  Avoid cooking with butter, cream, or high-fat meats. Meal planning  Eat meals and snacks regularly, preferably at the same times every day. Avoid going long periods of time without eating.  Eat foods that are high in fiber, such as fresh fruits, vegetables, beans, and whole grains. Talk with your dietitian about how many servings of carbs you can eat at each meal.  Eat 4-6 oz (112-168 g) of lean protein each day, such as lean meat, chicken, fish, eggs, or tofu. One ounce (oz) of lean protein is equal to: ? 1 oz (28 g) of meat, chicken, or fish. ? 1 egg. ?  cup (62 g) of tofu.  Eat some foods each day that contain healthy fats, such as avocado, nuts, seeds, and fish.   What foods should I eat? Fruits Berries. Apples. Oranges. Peaches. Apricots. Plums. Grapes. Mango. Papaya. Pomegranate. Kiwi. Cherries. Vegetables Lettuce. Spinach. Leafy greens, including kale, chard, collard greens, and mustard greens. Beets. Cauliflower. Cabbage. Broccoli. Carrots. Green beans. Tomatoes. Peppers. Onions. Cucumbers. Brussels sprouts. Grains Whole grains, such as whole-wheat or whole-grain bread, crackers, tortillas, cereal, and pasta. Unsweetened oatmeal. Quinoa. Brown or wild rice. Meats and other proteins Seafood. Poultry without skin. Lean cuts of poultry and beef. Tofu. Nuts. Seeds. Dairy Low-fat or fat-free dairy products such as milk, yogurt, and cheese. The items listed above may not be a complete list of foods and beverages you can eat. Contact a dietitian for more information. What foods should I avoid? Fruits Fruits canned with  syrup. Vegetables Canned vegetables. Frozen vegetables with butter or cream sauce. Grains Refined white flour and flour products such as bread, pasta, snack foods, and cereals. Avoid all processed foods. Meats and other proteins Fatty cuts of meat. Poultry with skin. Breaded or fried meats. Processed meat. Avoid saturated fats. Dairy Full-fat yogurt, cheese, or milk. Beverages Sweetened drinks, such as soda or iced tea. The items listed above may not be a complete list of foods and beverages you should avoid. Contact a dietitian for more information. Questions to ask a health care provider  Do I need to meet with a diabetes educator?  Do I need to meet with a dietitian?  What number can I call if I have questions?  When are the best times to check my blood glucose? Where to find more information:  American Diabetes Association: diabetes.org  Academy of Nutrition and Dietetics: www.eatright.org  National Institute of Diabetes and Digestive and Kidney Diseases: www.niddk.nih.gov  Association of Diabetes Care and Education Specialists: www.diabeteseducator.org Summary  It is important to have healthy eating   habits because your blood sugar (glucose) levels are greatly affected by what you eat and drink.  A healthy meal plan will help you control your blood glucose and maintain a healthy lifestyle.  Your health care provider may recommend that you work with a dietitian to make a meal plan that is best for you.  Keep in mind that carbohydrates (carbs) and alcohol have immediate effects on your blood glucose levels. It is important to count carbs and to use alcohol carefully. This information is not intended to replace advice given to you by your health care provider. Make sure you discuss any questions you have with your health care provider. Document Revised: 07/20/2019 Document Reviewed: 07/20/2019 [employer] Patient Education  2021 [employer]  

## 2021-05-04 NOTE — Assessment & Plan Note (Signed)
No recent flares, history of.  Check uric acid level today.  No current medications.

## 2021-05-04 NOTE — Assessment & Plan Note (Signed)
Continue to collaborate with urology, reviewed recent notes and labs. 

## 2021-05-04 NOTE — Assessment & Plan Note (Signed)
Chronic, ongoing.  Continue current medication regimen and adjust as needed.  Lipid panel today.  Return in 6 months. 

## 2021-05-04 NOTE — Progress Notes (Signed)
BP 116/70   Pulse 74   Temp 99 F (37.2 C) (Oral)   Ht '5\' 9"'$  (1.753 m)   Wt 213 lb 3.2 oz (96.7 kg)   SpO2 98%   BMI 31.48 kg/m    Subjective:    Patient ID: Fernando Frederick, male    DOB: 09/17/1955, 65 y.o.   MRN: [id number]  HPI: Fernando Frederick is a 65 y.o. male presenting on 05/04/2021 for comprehensive medical examination. Current medical complaints include:none  He currently lives with: wife Interim Problems from his last visit: no   He is a former smoker, quit 15-20 years ago.  Smoked for 40 years.  A pack of cigarettes last 2-3 days.    DIABETES Last A1C was 6.1%. Continues on Metformin 500 MG BID.  Continues focus on exercise. Hypoglycemic episodes:no Polydipsia/polyuria: no Visual disturbance: no Chest pain: no Paresthesias: no Glucose Monitoring: yes             Accucheck frequency: rarely             Fasting glucose: 100 range             Post prandial:             Evening:             Before meals: Taking Insulin?: no             Long acting insulin:             Short acting insulin: Blood Pressure Monitoring: not checking Retinal Examination: Up To Date -- due next month with Dr. Curley Spice Exam: Up to Date Pneumovax: Up to Date Influenza: Up to Date Aspirin: yes    HYPERTENSION / HYPERLIPIDEMIA Current Amlodipine, Lisinopril-HCTZ and Crestor. Satisfied with current treatment? yes Duration of hypertension: chronic BP monitoring frequency: not checking BP range:  BP medication side effects: no Duration of hyperlipidemia: chronic Cholesterol medication side effects: no Cholesterol supplements: none Medication compliance: good compliance Aspirin: yes Recent stressors: no Recurrent headaches: no Visual changes: no Palpitations: no Dyspnea: no Chest pain: no Lower extremity edema: no Dizzy/lightheaded: no    CHRONIC KIDNEY DISEASE Continues on Lisinopril for kidney protection.  Last CRT 1.75 and GFR 42. CKD status: stable Medications renally  dose: yes Previous renal evaluation: no Pneumovax:  Up to Date Influenza Vaccine: Up to Date  Functional Status Survey: Is the patient deaf or have difficulty hearing?: No Does the patient have difficulty seeing, even when wearing glasses/contacts?: No Does the patient have difficulty concentrating, remembering, or making decisions?: No Does the patient have difficulty walking or climbing stairs?: No Does the patient have difficulty dressing or bathing?: No Does the patient have difficulty doing errands alone such as visiting a doctor's office or shopping?: No  FALL RISK: Fall Risk  05/04/2021 10/29/2019 06/05/2018 05/02/2017 04/23/2016  Falls in the past year? 0 0 No No No  Number falls in past yr: 0 0 - - -  Injury with Fall? 0 0 - - -  Risk for fall due to : No Fall Risks - - - -  Follow up Falls evaluation completed Falls evaluation completed - - -    Depression Screen Depression screen [facility] 2/9 05/04/2021 05/03/2020 10/29/2019 05/02/2017 04/23/2016  Decreased Interest 0 0 0 0 0  Down, Depressed, Hopeless 0 0 0 0 0  PHQ - 2 Score 0 0 0 0 0  Altered sleeping - 1 - 3 -  Tired, decreased energy -  1 - 3 -  Change in appetite - 1 - 0 -  Feeling bad or failure about yourself  - 0 - 0 -  Trouble concentrating - 0 - 0 -  Moving slowly or fidgety/restless - 0 - 0 -  Suicidal thoughts - 0 - 0 -  PHQ-9 Score - 3 - 6 -  Difficult doing work/chores - Not difficult at all - - -    Advanced Directives A voluntary discussion about advance care planning including the explanation and discussion of advance directives was extensively discussed  with the patient for 15 minutes with patient.  Explanation about the health care proxy and Living will was reviewed and packet with forms with explanation of how to fill them out was given.  During this discussion, the patient was able to identify a health care proxy as his wife and plans to fill out the paperwork required.  Patient was offered a separate [place] visit for further assistance with forms.     Past Medical History:  Past Medical History:  Diagnosis Date   Benign hypertension with chronic kidney disease 04/24/2015   Chronic kidney disease    CKD (chronic kidney disease), stage III ([place]) 04/24/2015   Diabetes ([place]) 04/24/2015   Diabetes mellitus without complication ([place])    Gout 04/24/2015   Hyperlipidemia    Hypertension     Surgical History:  Past Surgical History:  Procedure Laterality Date   COLONOSCOPY WITH PROPOFOL N/A 12/08/2020   Procedure: COLONOSCOPY WITH PROPOFOL;  Surgeon: Lucilla Lame, MD;  Location: [facility] ENDOSCOPY;  Service: Endoscopy;  Laterality: N/A;   COLONOSCOPY WITH PROPOFOL N/A 02/20/2021   Procedure: COLONOSCOPY WITH PROPOFOL;  Surgeon: Lucilla Lame, MD;  Location: [facility] ENDOSCOPY;  Service: Endoscopy;  Laterality: N/A;   partial thumb amputation     PROSTATE SURGERY     Prostate Biopsy    Medications:  Current Outpatient Medications on File Prior to Visit  Medication Sig   aspirin 81 MG tablet Take 81 mg by mouth daily.   Cholecalciferol (VITAMIN D-3) 1000 UNITS CAPS Take 1,000 Units by mouth daily.   finasteride (PROSCAR) 5 MG tablet Take 1 tablet (5 mg total) by mouth daily.   Multiple Vitamin (MULTIVITAMIN) tablet Take 1 tablet by mouth daily.   Omega-3 Fatty Acids (FISH OIL) 1000 MG CAPS Take 1,000 mg by mouth daily.   No current facility-administered medications on file prior to visit.    Allergies:  No Known Allergies  Social History:  Social History   Socioeconomic History   Marital status: Married    Spouse name: Not on file   Number of children: Not on file   Years of education: Not on file   Highest education level: Not on file  Occupational History   Not on file  Tobacco Use   Smoking status: Former    Types: Cigarettes    Quit date: 08/26/2004    Years since quitting: 16.6   Smokeless tobacco: Never  Vaping Use   Vaping Use: Never used  Substance and Sexual Activity    Alcohol use: Yes    Alcohol/week: 6.0 standard drinks    Types: 6 Cans of beer per week    Comment: ocassionally    Drug use: No   Sexual activity: Yes  Other Topics Concern   Not on file  Social History Narrative   Not on file   Social Determinants of Health   Financial Resource Strain: Low Risk    Difficulty  of Paying Living Expenses: Not very hard  Food Insecurity: No Food Insecurity   Worried About [place] in the Last Year: Never true   Ran Out of Food in the Last Year: Never true  Transportation Needs: No Transportation Needs   Lack of Transportation (Medical): No   Lack of Transportation (Non-Medical): No  Physical Activity: Insufficiently Active   Days of Exercise per Week: 3 days   Minutes of Exercise per Session: 20 min  Stress: No Stress Concern Present   Feeling of Stress : Not at all  Social Connections: Socially Integrated   Frequency of Communication with Friends and Family: More than three times a week   Frequency of Social Gatherings with Friends and Family: More than three times a week   Attends Religious Services: More than 4 times per year   Active Member of [employer] or Organizations: Yes   Attends Archivist Meetings: Never   Marital Status: Married  Human resources officer Violence: Not At Risk   Fear of Current or Ex-Partner: No   Emotionally Abused: No   Physically Abused: No   Sexually Abused: No   Social History   Tobacco Use  Smoking Status Former   Types: Cigarettes   Quit date: 08/26/2004   Years since quitting: 16.6  Smokeless Tobacco Never   Social History   Substance and Sexual Activity  Alcohol Use Yes   Alcohol/week: 6.0 standard drinks   Types: 6 Cans of beer per week   Comment: ocassionally     Family History:  Family History  Problem Relation Age of Onset   Cancer Mother        lung   Hypertension Mother    Diabetes Father    Hypertension Father    Kidney disease Father    Hypertension Sister     Hypertension Sister    Hypertension Sister    Kidney disease Brother     Past medical history, surgical history, medications, allergies, family history and social history reviewed with patient today and changes made to appropriate areas of the chart.   Review of Systems - negative All other ROS negative except what is listed above and in the HPI.      Objective:    BP 116/70   Pulse 74   Temp 99 F (37.2 C) (Oral)   Ht '5\' 9"'$  (1.753 m)   Wt 213 lb 3.2 oz (96.7 kg)   SpO2 98%   BMI 31.48 kg/m   Wt Readings from Last 3 Encounters:  05/04/21 213 lb 3.2 oz (96.7 kg)  03/14/21 215 lb (97.5 kg)  02/20/21 215 lb (97.5 kg)    Physical Exam Vitals and nursing note reviewed.  Constitutional:      General: He is awake. He is not in acute distress.    Appearance: He is well-developed and well-groomed. He is obese. He is not ill-appearing or toxic-appearing.  HENT:     Head: Normocephalic and atraumatic.     Right Ear: Hearing, tympanic membrane, ear canal and external ear normal. No drainage.     Left Ear: Hearing, tympanic membrane, ear canal and external ear normal. No drainage.     Nose: Nose normal.     Mouth/Throat:     Pharynx: Uvula midline.  Eyes:     General: Lids are normal.        Right eye: No discharge.        Left eye: No discharge.     Extraocular Movements:  Extraocular movements intact.     Conjunctiva/sclera: Conjunctivae normal.     Pupils: Pupils are equal, round, and reactive to light.     Visual Fields: Right eye visual fields normal and left eye visual fields normal.  Neck:     Thyroid: No thyromegaly.     Vascular: No carotid bruit or JVD.     Trachea: Trachea normal.  Cardiovascular:     Rate and Rhythm: Normal rate and regular rhythm.     Heart sounds: Normal heart sounds, S1 normal and S2 normal. No murmur heard.   No gallop.  Pulmonary:     Effort: Pulmonary effort is normal. No accessory muscle usage or respiratory distress.     Breath sounds:  Normal breath sounds.  Abdominal:     General: Bowel sounds are normal.     Palpations: Abdomen is soft. There is no hepatomegaly or splenomegaly.     Tenderness: There is no abdominal tenderness.  Musculoskeletal:        General: Normal range of motion.     Cervical back: Normal range of motion and neck supple.     Right lower leg: No edema.     Left lower leg: No edema.  Lymphadenopathy:     Head:     Right side of head: No submental, submandibular, tonsillar, preauricular or posterior auricular adenopathy.     Left side of head: No submental, submandibular, tonsillar, preauricular or posterior auricular adenopathy.     Cervical: No cervical adenopathy.  Skin:    General: Skin is warm and dry.     Capillary Refill: Capillary refill takes less than 2 seconds.     Findings: No rash.  Neurological:     Mental Status: He is alert and oriented to person, place, and time.     Cranial Nerves: Cranial nerves are intact.     Gait: Gait is intact.     Deep Tendon Reflexes: Reflexes are normal and symmetric.     Reflex Scores:      Brachioradialis reflexes are 2+ on the right side and 2+ on the left side.      Patellar reflexes are 2+ on the right side and 2+ on the left side. Psychiatric:        Attention and Perception: Attention normal.        Mood and Affect: Mood normal.        Speech: Speech normal.        Behavior: Behavior normal. Behavior is cooperative.        Thought Content: Thought content normal.        Cognition and Memory: Cognition normal.        Judgment: Judgment normal.    Diabetic Foot Exam - Simple   Simple Foot Form Visual Inspection No deformities, no ulcerations, no other skin breakdown bilaterally: Yes Sensation Testing Intact to touch and monofilament testing bilaterally: Yes Pulse Check Posterior Tibialis and Dorsalis pulse intact bilaterally: Yes Comments     Results for orders placed or performed in visit on 03/12/21  PSA  Result Value Ref Range    Prostate Specific Ag, Serum 3.5 0.0 - 4.0 ng/mL      Assessment & Plan:   Problem List Items Addressed This Visit       Cardiovascular and Mediastinum   Hypertension associated with diabetes ([place])    Chronic, ongoing with BP below goal today.  Recommend he check BP occasionally at home and document & focus on DASH diet. Continue  current medication regimen and adjust as needed.  Urine ALB 30 in March 2022, continue Lisinopril for kidney protection.  CMP, CBC, TSH today.  Could consider discontinuation of HCTZ if kidney function remains lower side and increase Lisinopril as needed for BP. Return in 6 months.      Relevant Medications   rosuvastatin (CRESTOR) 20 MG tablet   metFORMIN (GLUCOPHAGE) 500 MG tablet   amLODipine (NORVASC) 10 MG tablet   lisinopril-hydrochlorothiazide (ZESTORETIC) 20-12.5 MG tablet   Other Relevant Orders   Bayer DCA Hb A1c Waived   CBC with Differential/Platelet   Comprehensive metabolic panel   TSH     Endocrine   Hyperlipidemia associated with type 2 diabetes mellitus (HCC)    Chronic, ongoing.  Continue current medication regimen and adjust as needed.  Lipid panel today.  Return in 6 months.      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  Recent A1C below goal at 5.8% today and urine ALB 30 in March 2022.  Continue current medication regimen, including Lisinopril for kidney protection.  Recommend he continue to check BS a few times a week at home.  Return in 6 months for follow-up.  May consider reduction of Metformin to once a day if continued good control and weight loss.      Relevant Medications   rosuvastatin (CRESTOR) 20 MG tablet    metFORMIN (GLUCOPHAGE) 500 MG tablet   lisinopril-hydrochlorothiazide (ZESTORETIC) 20-12.5 MG tablet   Other Relevant Orders   Bayer DCA Hb A1c Waived     Genitourinary   CKD (chronic kidney disease), stage III (HCC)    Chronic, ongoing. Continue Lisinopril for kidney protection.  CMP today, urine ALB 30 in March 2022.  Consider nephrology referral if decline noted.  Could consider discontinuation of HCTZ if decline noted and then increase Lisinopril as needed for BP control.  Also monitor Metformin and kidneys, may need reduction or discontinuation in future.      Relevant Orders   Bayer DCA Hb A1c Waived   CBC with Differential/Platelet   Prostate cancer ([place])    Continue to collaborate with urology, reviewed recent notes and labs.        Other   Gout    No recent flares, history of.  Check uric acid level today.  No current medications.      Relevant Orders   Uric acid   Obesity (BMI 30-39.9)    BMI 31.48.  Recommended eating smaller high protein, low fat meals more frequently and exercising 30 mins a day 5 times a week with a goal of 10-15lb weight loss in the next 3 months. Patient voiced their understanding and motivation to adhere to these recommendations.       Relevant Medications   metFORMIN (GLUCOPHAGE) 500 MG tablet   Vitamin D deficiency    Chronic, stable.  Continue supplement at home and check Vit D level today.      Relevant Orders   VITAMIN D 25 Hydroxy (Vit-D Deficiency, Fractures)   History of smoking    Recommend continued cessation.  Up To Date on AAA screening.      Other Visit Diagnoses  Pneumococcal vaccination given       PPSV23 provided today and then will be up to date on vaccines.   Relevant Orders   Pneumococcal polysaccharide vaccine 23-valent greater than or equal to 2yo subcutaneous/IM   Annual physical exam       Annual physical today with labs.  Health maintenance reviewed: PPSV23 given and will look into Shingrix, ordered if  requested.        Discussed aspirin prophylaxis for myocardial infarction prevention and decision was made to continue ASA  LABORATORY TESTING:  Health maintenance labs ordered today as discussed above.   The natural history of prostate cancer and ongoing controversy regarding screening and potential treatment outcomes of prostate cancer has been discussed with the patient. The meaning of a false positive PSA and a false negative PSA has been discussed. He indicates understanding of the limitations of this screening test and wishes to proceed with screening PSA testing.  These are obtained with urology.   IMMUNIZATIONS:   - Tdap: Tetanus vaccination status reviewed: last tetanus booster within 10 years. - Influenza: Up to date - Pneumovax: given today - Prevnar: up to date - Zostavax vaccine: wishes to check with pharmacy first  SCREENING: - Colonoscopy: Up To Date -- next is 02/20/22 Discussed with patient purpose of the colonoscopy is to detect colon cancer at curable precancerous or early stages   - AAA Screening: Up To Date was negative -Hearing Test: Not applicable  -Spirometry: Not applicable   PATIENT COUNSELING:    Sexuality: Discussed sexually transmitted diseases, partner selection, use of condoms, avoidance of unintended pregnancy  and contraceptive alternatives.   Advised to avoid cigarette smoking.  I discussed with the patient that most people either abstain from alcohol or drink within safe limits (<=14/week and <=4 drinks/occasion for males, <=7/weeks and <= 3 drinks/occasion for females) and that the risk for alcohol disorders and other health effects rises proportionally with the number of drinks per week and how often a drinker exceeds daily limits.  Discussed cessation/primary prevention of drug use and availability of treatment for abuse.   Diet: Encouraged to adjust caloric intake to maintain  or achieve ideal body weight, to reduce intake of dietary  saturated fat and total fat, to limit sodium intake by avoiding high sodium foods and not adding table salt, and to maintain adequate dietary potassium and calcium preferably from fresh fruits, vegetables, and low-fat dairy products.    Stressed the importance of regular exercise  Injury prevention: Discussed safety belts, safety helmets, smoke detector, smoking near bedding or upholstery.   Dental health: Discussed importance of regular tooth brushing, flossing, and dental visits.   Follow up plan: NEXT PREVENTATIVE PHYSICAL DUE IN 1 YEAR. Return in about 6 months (around 11/01/2021) for T2DM, HTN/HLD, PROSTATE CA, CKD.

## 2021-05-04 NOTE — Assessment & Plan Note (Signed)
Chronic, ongoing with BP below goal today.  Recommend he check BP occasionally at home and document & focus on DASH diet. Continue current medication regimen and adjust as needed.  Urine ALB 30 in March 2022, continue Lisinopril for kidney protection.  CMP, CBC, TSH today.  Could consider discontinuation of HCTZ if kidney function remains lower side and increase Lisinopril as needed for BP. Return in 6 months.

## 2021-05-04 NOTE — Assessment & Plan Note (Signed)
BMI 31.48.  Recommended eating smaller high protein, low fat meals more frequently and exercising 30 mins a day 5 times a week with a goal of 10-15lb weight loss in the next 3 months. Patient voiced their understanding and motivation to adhere to these recommendations.

## 2021-05-04 NOTE — Assessment & Plan Note (Signed)
Chronic, ongoing.  Recent A1C below goal at 5.8% today and urine ALB 30 in March 2022.  Continue current medication regimen, including Lisinopril for kidney protection.  Recommend he continue to check BS a few times a week at home.  Return in 6 months for follow-up.  May consider reduction of Metformin to once a day if continued good control and weight loss.

## 2021-05-04 NOTE — Assessment & Plan Note (Signed)
Recommend continued cessation.  Up To Date on AAA screening. 

## 2021-05-05 LAB — COMPREHENSIVE METABOLIC PANEL
ALT: 16 IU/L (ref 0–44)
AST: 22 IU/L (ref 0–40)
Albumin/Globulin Ratio: 1.8 (ref 1.2–2.2)
Albumin: 4.6 g/dL (ref 3.8–4.8)
Alkaline Phosphatase: 74 IU/L (ref 44–121)
BUN/Creatinine Ratio: 14 (ref 10–24)
BUN: 23 mg/dL (ref 8–27)
Bilirubin Total: 0.3 mg/dL (ref 0.0–1.2)
CO2: 22 mmol/L (ref 20–29)
Calcium: 9.9 mg/dL (ref 8.6–10.2)
Chloride: 102 mmol/L (ref 96–106)
Creatinine, Ser: 1.68 mg/dL — ABNORMAL HIGH (ref 0.76–1.27)
Globulin, Total: 2.6 g/dL (ref 1.5–4.5)
Glucose: 109 mg/dL — ABNORMAL HIGH (ref 65–99)
Potassium: 4.5 mmol/L (ref 3.5–5.2)
Sodium: 141 mmol/L (ref 134–144)
Total Protein: 7.2 g/dL (ref 6.0–8.5)
eGFR: 44 mL/min/{1.73_m2} — ABNORMAL LOW (ref >59)

## 2021-05-05 LAB — TSH: TSH: 0.734 u[IU]/mL (ref 0.450–4.500)

## 2021-05-05 LAB — CBC WITH DIFFERENTIAL/PLATELET
Basophils Absolute: 0 10*3/uL (ref 0.0–0.2)
Basos: 1 %
EOS (ABSOLUTE): 0.3 10*3/uL (ref 0.0–0.4)
Eos: 6 %
Hematocrit: 40.8 % (ref 37.5–51.0)
Hemoglobin: 13.7 g/dL (ref 13.0–17.7)
Immature Grans (Abs): 0 10*3/uL (ref 0.0–0.1)
Immature Granulocytes: 0 %
Lymphocytes Absolute: 2.1 10*3/uL (ref 0.7–3.1)
Lymphs: 40 %
MCH: 27.7 pg (ref 26.6–33.0)
MCHC: 33.6 g/dL (ref 31.5–35.7)
MCV: 82 fL (ref 79–97)
Monocytes Absolute: 0.5 10*3/uL (ref 0.1–0.9)
Monocytes: 10 %
Neutrophils Absolute: 2.2 10*3/uL (ref 1.4–7.0)
Neutrophils: 43 %
Platelets: 272 10*3/uL (ref 150–450)
RBC: 4.95 x10E6/uL (ref 4.14–5.80)
RDW: 13.9 % (ref 11.6–15.4)
WBC: 5.2 10*3/uL (ref 3.4–10.8)

## 2021-05-05 LAB — VITAMIN D 25 HYDROXY (VIT D DEFICIENCY, FRACTURES): Vit D, 25-Hydroxy: 47.4 ng/mL (ref 30.0–100.0)

## 2021-05-05 LAB — LIPID PANEL W/O CHOL/HDL RATIO
Cholesterol, Total: 143 mg/dL (ref 100–199)
HDL: 35 mg/dL — ABNORMAL LOW (ref >39)
LDL Chol Calc (NIH): 60 mg/dL (ref 0–99)
Triglycerides: 309 mg/dL — ABNORMAL HIGH (ref 0–149)
VLDL Cholesterol Cal: 48 mg/dL — ABNORMAL HIGH (ref 5–40)

## 2021-05-05 LAB — URIC ACID: Uric Acid: 8 mg/dL (ref 3.8–8.4)

## 2021-05-05 NOTE — Progress Notes (Signed)
Contacted via [place] [facility] [place], your labs have returned: - CBC is normal with no anemia - Kidney function, creatinine and eGFR, continues to show baseline kidney disease with no worsening.  We will continue to monitor this closely.  Liver function, AST and ALT, is normal. - Thyroid level, uric acid, and Vit D are normal - Cholesterol levels continue to show LDL at goal, but triglycerides remain elevated.  Add a little fish oil to medication daily and we will recheck next visit, if still elevated we may increase statin dose or add on Zetia to help lower.  Any questions? Keep being amazing!!  Thank you for allowing me to participate in your care.  I appreciate you. Kindest regards, Alvera Tourigny

## 2021-05-12 ENCOUNTER — Ambulatory Visit (INDEPENDENT_AMBULATORY_CARE_PROVIDER_SITE_OTHER): Payer: Medicare HMO

## 2021-05-12 DIAGNOSIS — Z0000 Encounter for general adult medical examination without abnormal findings: Secondary | ICD-10-CM

## 2021-05-12 NOTE — Progress Notes (Addendum)
Subjective:   Fernando Frederick is a 65 y.o. male who presents for an Initial Medicare Annual Wellness Visit.I connected with  Fernando Frederick on 05/12/21 by a audio enabled telemedicine application and verified that I am speaking with the correct person using two identifiers.   I discussed the limitations of evaluation and management by telemedicine. The patient expressed understanding and agreed to proceed.  Location of patient: home Location of provider: office  Review of Systems    Defer to PCP       Objective:    Today's Vitals   05/12/21 1136  PainSc: 0-No pain   There is no height or weight on file to calculate BMI.  Advanced Directives 02/20/2021 12/08/2020  Does Patient Have a Medical Advance Directive? No No  Would patient like information on creating a medical advance directive? No - Patient declined -    Current Medications (verified) Outpatient Encounter Medications as of 05/12/2021  Medication Sig   amLODipine (NORVASC) 10 MG tablet Take 1 tablet (10 mg total) by mouth daily.   aspirin 81 MG tablet Take 81 mg by mouth daily.   Cholecalciferol (VITAMIN D-3) 1000 UNITS CAPS Take 1,000 Units by mouth daily.   cyanocobalamin 2000 MCG tablet Take 2,000 mcg by mouth daily. One tablet daily   finasteride (PROSCAR) 5 MG tablet Take 1 tablet (5 mg total) by mouth daily.   lisinopril-hydrochlorothiazide (ZESTORETIC) 20-12.5 MG tablet Take 1 tablet by mouth daily.   metFORMIN (GLUCOPHAGE) 500 MG tablet Take 1 tablet (500 mg total) by mouth 2 (two) times daily with a meal.   Multiple Vitamin (MULTIVITAMIN) tablet Take 1 tablet by mouth daily.   Omega-3 Fatty Acids (FISH OIL) 1000 MG CAPS Take 1,000 mg by mouth daily.   rosuvastatin (CRESTOR) 20 MG tablet Take 1 tablet (20 mg total) by mouth daily.   No facility-administered encounter medications on file as of 05/12/2021.    Allergies (verified) Patient has no known allergies.   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   Chronic kidney disease    CKD (chronic kidney disease), stage III ([place]) 04/24/2015   Diabetes ([place]) 04/24/2015   Diabetes mellitus without complication ([place])    Gout 04/24/2015   Hyperlipidemia    Hypertension    Past Surgical History:  Procedure Laterality Date   COLONOSCOPY WITH PROPOFOL N/A 12/08/2020   Procedure: COLONOSCOPY WITH PROPOFOL;  Surgeon: Lucilla Lame, MD;  Location: [facility] ENDOSCOPY;  Service: Endoscopy;  Laterality: N/A;   COLONOSCOPY WITH PROPOFOL N/A 02/20/2021   Procedure: COLONOSCOPY WITH PROPOFOL;  Surgeon: Lucilla Lame, MD;  Location: [facility] ENDOSCOPY;  Service: Endoscopy;  Laterality: N/A;   partial thumb amputation     PROSTATE SURGERY     Prostate Biopsy   Family History  Problem Relation Age of Onset   Cancer Mother        lung   Hypertension Mother    Diabetes Father    Hypertension Father    Kidney disease Father    Hypertension Sister    Hypertension Sister    Hypertension Sister    Kidney disease Brother    Social History   Socioeconomic History   Marital status: Married    Spouse name: Not on file   Number of children: Not on file   Years of education: Not on file   Highest education level: Not on file  Occupational History   Not on file  Tobacco  Use   Smoking status: Former    Types: Cigarettes    Quit date: 08/26/2004    Years since quitting: 16.7   Smokeless tobacco: Never  Vaping Use   Vaping Use: Never used  Substance and Sexual Activity   Alcohol use: Yes    Alcohol/week: 6.0 standard drinks    Types: 6 Cans of beer per week    Comment: ocassionally    Drug use: No   Sexual activity: Yes  Other Topics Concern   Not on file  Social History Narrative   Not on file   Social Determinants of Health   Financial Resource Strain: Low Risk    Difficulty of Paying Living Expenses: Not hard at all  Food Insecurity: No Food Insecurity   Worried About Sales executive in the Last Year: Never true   [place] in the Last Year: Never true  Transportation Needs: No Transportation Needs   Lack of Transportation (Medical): No   Lack of Transportation (Non-Medical): No  Physical Activity: Insufficiently Active   Days of Exercise per Week: 7 days   Minutes of Exercise per Session: 10 min  Stress: No Stress Concern Present   Feeling of Stress : Not at all  Social Connections: Moderately Integrated   Frequency of Communication with Friends and Family: Twice a week   Frequency of Social Gatherings with Friends and Family: Twice a week   Attends Religious Services: 1 to 4 times per year   Active Member of [employer] or Organizations: No   Attends Music therapist: Never   Marital Status: Married    Tobacco Counseling Counseling given: Not Answered   Clinical Intake:  Pre-visit preparation completed: Yes  Pain : No/denies pain Pain Score: 0-No pain     Nutritional Risks: None Diabetes: Yes CBG done?: No Did pt. bring in CBG monitor from home?: No Glucose Meter Downloaded?: No  How often do you need to have someone help you when you read instructions, pamphlets, or other written materials from your doctor or pharmacy?: 1 - Never What is the last grade level you completed in school?: 12 th  Diabetic?Yes  Interpreter Needed?: No  Information entered by :: Fernando Frederick   Activities of Daily Living In your present state of health, do you have any difficulty performing the following activities: 05/12/2021 05/04/2021  Hearing? 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 N N  Difficulty concentrating or making decisions? N N  Walking or climbing stairs? N N  Dressing or bathing? N N  Doing errands, shopping? - N  Some recent data might be hidden    Patient Care Team: Venita Lick, NP as PCP - General (Nurse Practitioner)  Indicate any recent Medical Services you may have received from other  than [facility] providers in the past year (date may be approximate).     Assessment:   This is a routine wellness examination for [name].  Hearing/Vision screen No results found.  Dietary issues and exercise activities discussed: Current Exercise Habits: Home exercise routine, Type of exercise: stretching;Other - see comments, Time (Minutes): 10, Frequency (Times/Week): 7, Weekly Exercise (Minutes/Week): 70, Exercise limited by: None identified   Goals Addressed   None    Depression Screen PHQ 2/9 Scores 05/12/2021 05/12/2021 05/04/2021 05/03/2020 10/29/2019 05/02/2017 04/23/2016  PHQ - 2 Score 0 0 0 0 0 0 0  PHQ- 9 Score 1 - - 3 - 6 -  Fall Risk Fall Risk  05/12/2021 05/04/2021 10/29/2019 06/05/2018 05/02/2017  Falls in the past year? 0 0 0 No No  Number falls in past yr: 0 0 0 - -  Injury with Fall? 0 0 0 - -  Risk for fall due to : - No Fall Risks - - -  Follow up Falls evaluation completed Falls evaluation completed Falls evaluation completed - -    FALL RISK PREVENTION PERTAINING TO THE HOME:  Any stairs in or around the home? No  If so, are there any without handrails? Yes  Home free of loose throw rugs in walkways, pet beds, electrical cords, etc? Yes  Adequate lighting in your home to reduce risk of falls? Yes   ASSISTIVE DEVICES UTILIZED TO PREVENT FALLS:  Life alert? No  Use of a cane, [place] or w/c? No  Grab bars in the bathroom? No  Shower chair or bench in shower? No  Elevated toilet seat or a handicapped toilet? No   TIMED UP AND GO:  Was the test performed?  N/A .  Length of time to ambulate 10 feet: N/[place].     Cognitive Function:     6CIT Screen 05/12/2021  What Year? 0 points  What month? 0 points  What time? 0 points  Count back from 20 0 points  Months in reverse 0 points  Repeat phrase 0 points  Total Score 0    Immunizations Immunization History  Administered Date(s) Administered   Fluad Quad(high Dose 65+) 05/03/2020   Influenza,inj,Quad PF,6+ Mos  05/14/2019   Influenza-Unspecified 05/27/2015, 05/27/2016   Moderna Sars-Covid-2 Vaccination 01/11/2020, 02/08/2020, 08/15/2020   Pneumococcal Conjugate-13 05/03/2020   Pneumococcal Polysaccharide-23 01/11/2013, 05/04/2021   Td 03/11/2012   Tdap 04/07/2012    TDAP status: Up to date  Flu Vaccine status: Due, Education has been provided regarding the importance of this vaccine. Advised may receive this vaccine at local pharmacy or Health Dept. Aware to provide a copy of the vaccination record if obtained from local pharmacy or Health Dept. Verbalized acceptance and understanding.  Pneumococcal vaccine status: Up to date  Covid-19 vaccine status: Completed vaccines  Qualifies for Shingles Vaccine? Yes   Zostavax completed Yes   Shingrix Completed?: No.    Education has been provided regarding the importance of this vaccine. Patient has been advised to call insurance company to determine out of pocket expense if they have not yet received this vaccine. Advised may also receive vaccine at local pharmacy or Health Dept. Verbalized acceptance and understanding.  Screening Tests Health Maintenance  Topic Date Due   INFLUENZA VACCINE  03/26/2021   COVID-19 Vaccine (4 - Booster for Moderna series) 05/20/2021 (Originally 11/07/2020)   Zoster Vaccines- Shingrix (1 of 2) 08/03/2021 (Originally 02/04/1975)   OPHTHALMOLOGY EXAM  09/06/2021   HEMOGLOBIN A1C  11/01/2021   COLONOSCOPY (Pts 45-78yr Insurance coverage will need to be confirmed)  02/20/2022   TETANUS/TDAP  04/07/2022   FOOT EXAM  05/04/2022   Hepatitis C Screening  Completed   HPV VACCINES  Aged Out    Health Maintenance  Health Maintenance Due  Topic Date Due   INFLUENZA VACCINE  03/26/2021    Colorectal cancer screening: Type of screening: Colonoscopy. Completed  . Repeat every 1 years  Lung Cancer Screening: (Low Dose CT Chest recommended if Age 77109-80years, 30 pack-year currently smoking OR have quit w/in 15years.) does  not qualify.   Lung Cancer Screening Referral: per provider  Additional Screening:  Hepatitis C Screening: does  not qualify; Completed   Vision Screening: Recommended annual ophthalmology exams for early detection of glaucoma and other disorders of the eye. Is the patient up to date with their annual eye exam?  Yes  Who is the provider or what is the name of the office in which the patient attends annual eye exams? Dr. Gloriann Loan If pt is not established with a provider, would they like to be referred to a provider to establish care?  N/A .   Dental Screening: Recommended annual dental exams for proper oral hygiene  Community Resource Referral / Chronic Care Management: CRR required this visit?  No   CCM required this visit?  No      Plan:     I have personally reviewed and noted the following in the patient's chart:   Medical and social history Use of alcohol, tobacco or illicit drugs  Current medications and supplements including opioid prescriptions. Patient is not currently taking opioid prescriptions. Functional ability and status Nutritional status Physical activity Advanced directives List of other physicians Hospitalizations, surgeries, and ER visits in previous 12 months Vitals Screenings to include cognitive, depression, and falls Referrals and appointments  In addition, I have reviewed and discussed with patient certain preventive protocols, quality metrics, and best practice recommendations. A written personalized care plan for preventive services as well as general preventive health recommendations were provided to patient.     Fernando Frederick, [place]   05/12/2021   Nurse Notes: non face to face- 60 minutes  Mr. Narro , Thank you for taking time to come for your Medicare Wellness Visit. I appreciate your ongoing commitment to your health goals. Please review the following plan we discussed and let me know if I can assist you in the future.   These are the goals we  discussed:  Goals   None     This is a list of the screening recommended for you and due dates:  Health Maintenance  Topic Date Due   Flu Shot  03/26/2021   COVID-19 Vaccine (4 - Booster for Moderna series) 05/20/2021*   Zoster (Shingles) Vaccine (1 of 2) 08/03/2021*   Eye exam for diabetics  09/06/2021   Hemoglobin A1C  11/01/2021   Colon Cancer Screening  02/20/2022   Tetanus Vaccine  04/07/2022   Complete foot exam   05/04/2022   Hepatitis C Screening: USPSTF Recommendation to screen - Ages 18-79 yo.  Completed   HPV Vaccine  Aged Out  *Topic was postponed. The date shown is not the original due date.     I, as supervising provider, have reviewed the nurse health advisor's Medicare Wellness Visit note for this patient and concur with the findings and recommendations listed above.

## 2021-05-15 ENCOUNTER — Ambulatory Visit: Payer: Medicare HMO

## 2021-07-24 ENCOUNTER — Ambulatory Visit: Payer: Self-pay | Admitting: Urology

## 2021-08-17 ENCOUNTER — Other Ambulatory Visit: Payer: Self-pay | Admitting: Physician Assistant

## 2021-09-17 NOTE — Progress Notes (Incomplete)
° °  09/18/21  CC: No chief complaint on file.   HPI: Fernando Frederick is a 66 y.o. male with low risk prostate cancer on active surveillance who returns today for repeat confirmatory prostate biopsy.  He underwent prostate biopsy on 09/06/2020.  This revealed Gleason 3+3 involving 4 of 12 cores up to 100% of the tissue, at the left mid and apex.   Preprocedure PSA 11/21 was 3.2 on finasteride.   He has a family history of prostate cancer, brother.   TRUS volume 46.25 g.  PSA on 03/12/2021 was 3.5.      There were no vitals filed for this visit. NED. A&Ox3.   No respiratory distress   Abd soft, NT, ND Normal external genitalia with patent urethral meatus  Prostate Biopsy Procedure   Informed consent was obtained after discussing risks/benefits of the procedure.  A time out was performed to ensure correct patient identity.  Pre-Procedure: - Last PSA Level:  Component     Latest Ref Rng & Units 03/12/2021  Prostate Specific Ag, Serum     0.0 - 4.0 ng/mL 3.5   - Gentamicin given prophylactically - Levaquin 500 mg administered PO -Transrectal Ultrasound performed revealing a *** gm prostate -No significant hypoechoic or median lobe noted  Procedure: - Prostate block performed using 10 cc 1% lidocaine and biopsies taken from sextant areas, a total of 12 under ultrasound guidance.  Post-Procedure: - Patient tolerated the procedure well - He was counseled to seek immediate medical attention if experiences any severe pain, significant bleeding, or fevers - Return in one week to discuss biopsy results  I,Kailey Littlejohn,acting as a scribe for Hollice Espy, MD.,have documented all relevant documentation on the behalf of Hollice Espy, MD,as directed by  Hollice Espy, MD while in the presence of Hollice Espy, MD.

## 2021-09-18 ENCOUNTER — Other Ambulatory Visit: Payer: Self-pay

## 2021-09-18 ENCOUNTER — Other Ambulatory Visit: Payer: Medicare HMO | Admitting: Urology

## 2021-09-18 ENCOUNTER — Other Ambulatory Visit: Payer: Medicare HMO

## 2021-09-18 DIAGNOSIS — C61 Malignant neoplasm of prostate: Secondary | ICD-10-CM

## 2021-09-19 LAB — PSA: Prostate Specific Ag, Serum: 4.3 ng/mL — ABNORMAL HIGH (ref 0.0–4.0)

## 2021-09-24 ENCOUNTER — Telehealth: Payer: Self-pay | Admitting: *Deleted

## 2021-09-24 NOTE — Telephone Encounter (Addendum)
Patient advised, voiced understanding  ----- Message from Hollice Espy, MD sent at 09/24/2021 12:21 PM EST ----- Glad that we have decided to pursue repeat biopsy.  PSA has continued to go up, now 4.3.  Hollice Espy, MD

## 2021-10-02 NOTE — Progress Notes (Signed)
° °  10/08/21  CC:  Chief Complaint  Patient presents with   Prostate Biopsy    HPI: Fernando Frederick is a 66 y.o. male with a personal history of low risk prostate cancer on active surveillance who returns today for a repeat confirmatory prostate biopsy.   He underwent prostate biopsy on 09/06/2020.  This revealed Gleason 3+3 involving 4 of 12 cores up to 100% of the tissue, at the left mid and apex.   Preprocedure PSA 11/21 was 3.2 on finasteride.   He has a family history of prostate cancer, brother.   TRUS volume 46.25 g.   His PSA has risen to 4.3 on 09/18/2021 from a previous PSA of 3.5.   He underwent prostate MRI to serve as a baseline which shows a PI-RADS 4, nearly 5 lesion at the left posterior lateral peripheral zone in the mid gland measuring 1.4 cm in long axis.  This abutted but did not spread beyond the capsule.  There is no evidence of pelvic lymphadenopathy or metastasis.   Vitals:   10/03/21 1050  BP: 133/77  Pulse: 76   NED. A&Ox3.   No respiratory distress   Abd soft, NT, ND Normal external genitalia with patent urethral meatus  Prostate Biopsy Procedure   Informed consent was obtained after discussing risks/benefits of the procedure.  A time out was performed to ensure correct patient identity.  Pre-Procedure: - Last PSA Level:  Component     Latest Ref Rng & Units 09/18/2021  Prostate Specific Ag, Serum     0.0 - 4.0 ng/mL 4.3 (H)  - Gentamicin given prophylactically - Levaquin 500 mg administered PO -Transrectal Ultrasound performed revealing a   42.4 gm prostate -No significant hypoechoic or median lobe noted  Procedure: - Prostate block performed using 10 cc 1% lidocaine and biopsies taken from sextant areas, a total of 12 under ultrasound guidance.  Post-Procedure: - Patient tolerated the procedure well - He was counseled to seek immediate medical attention if experiences any severe pain, significant bleeding, or fevers - Return in one week  to discuss biopsy results  Conley Rolls as a scribe for Hollice Espy, MD.,have documented all relevant documentation on the behalf of Hollice Espy, MD,as directed by  Hollice Espy, MD while in the presence of Hollice Espy, MD.  I have reviewed the above documentation for accuracy and completeness, and I agree with the above.   Hollice Espy, MD

## 2021-10-03 ENCOUNTER — Ambulatory Visit: Payer: Medicare HMO | Admitting: Urology

## 2021-10-03 ENCOUNTER — Other Ambulatory Visit: Payer: Self-pay

## 2021-10-03 ENCOUNTER — Encounter: Payer: Self-pay | Admitting: Urology

## 2021-10-03 VITALS — BP 133/77 | HR 76 | Ht 69.0 in | Wt 213.0 lb

## 2021-10-03 DIAGNOSIS — Z298 Encounter for other specified prophylactic measures: Secondary | ICD-10-CM

## 2021-10-03 DIAGNOSIS — R972 Elevated prostate specific antigen [PSA]: Secondary | ICD-10-CM | POA: Diagnosis not present

## 2021-10-03 DIAGNOSIS — C61 Malignant neoplasm of prostate: Secondary | ICD-10-CM

## 2021-10-03 MED ORDER — LEVOFLOXACIN 500 MG PO TABS
500.0000 mg | ORAL_TABLET | Freq: Once | ORAL | Status: AC
  Administered 2021-10-03: 500 mg via ORAL

## 2021-10-03 MED ORDER — GENTAMICIN SULFATE 40 MG/ML IJ SOLN
80.0000 mg | Freq: Once | INTRAMUSCULAR | Status: AC
  Administered 2021-10-03: 80 mg via INTRAMUSCULAR

## 2021-10-03 NOTE — Patient Instructions (Signed)
Transrectal Ultrasound-Guided Prostate Biopsy, Care After What can I expect after the procedure? After the procedure, it is common to have: Pain and discomfort near your butt (rectum), especially while sitting. Pink-colored pee (urine). This is due to small amounts of blood in your pee. A burning feeling while peeing. Blood in your poop (stool). Bleeding from your butt. Blood in your semen. Follow these instructions at home: Medicines Take over-the-counter and prescription medicines only as told by your doctor. If you were given a sedative during your procedure, do not drive or use machines until your doctor says that it is safe. A sedative is a medicine that helps you relax. If you were prescribed an antibiotic medicine, take it as told by your doctor. Do not stop taking it even if you start to feel better. Activity  Return to your normal activities when your doctor says that it is safe. Ask your doctor when it is okay for you to have sex. You may have to avoid lifting. Ask your doctor how much you can safely lift. General instructions  Drink enough water to keep your pee pale yellow. Watch your pee, poop, and semen for new bleeding or bleeding that gets worse. Keep all follow-up visits. Contact a doctor if: You have any of these: Blood clots in your pee or poop. Blood in your pee more than 2 weeks after the procedure. Blood in your semen more than 2 months after the procedure. New or worse bleeding in your pee, poop, or semen. Very bad belly pain. Your pee smells bad or unusual. You have trouble peeing. Your lower belly feels firm. You have problems getting an erection. You feel like you may vomit (are nauseous), or you vomit. Get help right away if: You have a fever or chills. You have bright red pee. You have very bad pain that does not get better with medicine. You cannot pee. Summary After this procedure, it is common to have pain and discomfort near your butt,  especially while sitting. You may have blood in your pee and poop. It is common to have blood in your semen. Get help right away if you have a fever or chills. This information is not intended to replace advice given to you by your health care provider. Make sure you discuss any questions you have with your health care provider. Document Revised: 02/05/2021 Document Reviewed: 02/05/2021 Elsevier Patient Education  [place].

## 2021-10-05 LAB — SURGICAL PATHOLOGY

## 2021-10-09 NOTE — Progress Notes (Signed)
10/15/21 3:48 PM   Fernando Frederick [birth year] [id number]  Referring provider:  Venita Lick, NP [address] [place],  [place] [address] Chief Complaint  Patient presents with   Prostate Cancer     HPI: Fernando Frederick is a 66 y.o.male male with a personal history of low risk prostate cancer on active surveillance who returns today for repeat prostate biopsy results.  He is accompanied today by his wife.  He underwent prostate biopsy on 09/06/2020.  This revealed Gleason 3+3 involving 4 of 12 cores up to 100% of the tissue, at the left mid and apex.   Preprocedure PSA 11/21 was 3.2 on finasteride.   He has a family history of prostate cancer, brother.   TRUS volume 46.25 g.   His PSA has risen to 4.3 on 09/18/2021 from a previous PSA of 3.5.    He underwent prostate MRI to serve as a baseline which shows a PI-RADS 4, nearly 5 lesion at the left posterior lateral peripheral zone in the mid gland measuring 1.4 cm in long axis.  This abutted but did not spread beyond the capsule.  There is no evidence of pelvic lymphadenopathy or metastasis.  He underwent a repeat biopsy on 10/03/2021 surgical pathology was consistent with Gleason 3+3 involving 3 cores affecting up to 50% and Gleason 3+4 involving 1 core affecting 10 %.    Surgical pathology:  [A] PROSTATE, LEFT BASE:   NEGATIVE FOR MALIGNANCY.   [B] PROSTATE, LEFT MID:   ACINAR ADENOCARCINOMA, GLEASON 3+3=6  (GG 1),  INVOLVING 1/1 CORES, MEASURING 2  MM ( 20%).   [C] PROSTATE, LEFT APEX:   ACINAR ADENOCARCINOMA, GLEASON 3+4=7  (GG  2), INVOLVING 1/1 CORES, MEASURING 1  MM ( 10%). 40% PATTERN 4.  DISCONTINUOUS FOCI SPANNING 3MM (30%).   [D] PROSTATE, RIGHT BASE:   NEGATIVE FOR MALIGNANCY.   [E] PROSTATE, RIGHT MID:   NEGATIVE FOR MALIGNANCY.   [F] PROSTATE, RIGHT APEX:   NEGATIVE FOR MALIGNANCY.   [G] PROSTATE, LEFT LATERAL BASE:   NEGATIVE FOR MALIGNANCY.   [H] PROSTATE, LEFT LATERAL MID:   ACINAR ADENOCARCINOMA, GLEASON 3+3=6   (GG 1), INVOLVING 1/1 CORES, MEASURING 6  MM ( 50%).   [I] PROSTATE, LEFT LATERAL APEX:   ACINAR ADENOCARCINOMA, GLEASON 3+3=6  (GG 1), INVOLVING 1/1 CORES, MEASURING 6  MM ( 50%).   [J] PROSTATE, RIGHT LATERAL BASE:   NEGATIVE FOR MALIGNANCY.   [K] PROSTATE, RIGHT LATERAL MID:   NEGATIVE FOR MALIGNANCY.   [L] PROSTATE, RIGHT LATERAL APEX:   NEGATIVE FOR MALIGNANCY.      IPSS     Row Name 10/10/21 1100         International Prostate Symptom Score   How often have you had the sensation of not emptying your bladder? Less than 1 in 5     How often have you had to urinate less than every two hours? About half the time     How often have you found you stopped and started again several times when you urinated? Less than 1 in 5 times     How often have you found it difficult to postpone urination? About half the time     How often have you had a weak urinary stream? Less than 1 in 5 times     How often have you had to strain to start urination? Less than 1 in 5 times     How many times did you typically  get up at night to urinate? 3 Times     Total IPSS Score 13       Quality of Life due to urinary symptoms   If you were to spend the rest of your life with your urinary condition just the way it is now how would you feel about that? Mixed              Score:  1-7 Mild 8-19 Moderate 20-35 Severe  SHIM     Row Name 10/10/21 1130         SHIM: Over the last 6 months:   How do you rate your confidence that you could get and keep an erection? Low     When you had erections with sexual stimulation, how often were your erections hard enough for penetration (entering your partner)? Almost Never or Never     During sexual intercourse, how often were you able to maintain your erection after you had penetrated (entered) your partner? Almost Never or Never     During sexual intercourse, how difficult was it to maintain your erection to completion of intercourse? Very Difficult     When  you attempted sexual intercourse, how often was it satisfactory for you? 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   Chronic kidney disease    CKD (chronic kidney disease), stage III ([place]) 04/24/2015   Diabetes ([place]) 04/24/2015   Diabetes mellitus without complication ([place])    Gout 04/24/2015   Hyperlipidemia    Hypertension     Surgical History: Past Surgical History:  Procedure Laterality Date   COLONOSCOPY WITH PROPOFOL N/A 12/08/2020   Procedure: COLONOSCOPY WITH PROPOFOL;  Surgeon: Lucilla Lame, MD;  Location: [facility] ENDOSCOPY;  Service: Endoscopy;  Laterality: N/A;   COLONOSCOPY WITH PROPOFOL N/A 02/20/2021   Procedure: COLONOSCOPY WITH PROPOFOL;  Surgeon: Lucilla Lame, MD;  Location: [facility] ENDOSCOPY;  Service: Endoscopy;  Laterality: N/A;   partial thumb amputation     PROSTATE SURGERY     Prostate Biopsy    Home Medications:  Allergies as of 10/10/2021   No Known Allergies      Medication List        Accurate as of October 10, 2021 11:59 PM. If you have any questions, ask your nurse or doctor.          amLODipine 10 MG tablet Commonly known as: NORVASC Take 1 tablet (10 mg total) by mouth daily.   aspirin 81 MG tablet Take 81 mg by mouth daily.   cyanocobalamin 2000 MCG tablet Take 2,000 mcg by mouth daily. One tablet daily   finasteride 5 MG tablet Commonly known as: PROSCAR Take 1 tablet (5 mg total) by mouth daily.   Fish Oil 1000 MG Caps Take 1,000 mg by mouth daily.   lisinopril-hydrochlorothiazide 20-12.5 MG tablet Commonly known as: ZESTORETIC Take 1 tablet by mouth daily.   metFORMIN 500 MG tablet Commonly known as: GLUCOPHAGE Take 1 tablet (500 mg total) by mouth 2 (two) times daily with a meal.   multivitamin tablet Take 1 tablet by mouth daily.   rosuvastatin 20 MG tablet Commonly known as:  CRESTOR Take 1 tablet (20 mg total) by mouth daily.   Vitamin D-3 25 MCG (1000 UT) Caps Take 1,000 Units  by mouth daily.        Allergies: No Known Allergies  Family History: Family History  Problem Relation Age of Onset   Cancer Mother        lung   Hypertension Mother    Diabetes Father    Hypertension Father    Kidney disease Father    Hypertension Sister    Hypertension Sister    Hypertension Sister    Kidney disease Brother     Social History:  reports that he quit smoking about 17 years ago. His smoking use included cigarettes. He has never used smokeless tobacco. He reports current alcohol use of about 6.0 standard drinks per week. He reports that he does not use drugs.   Physical Exam: BP (!) 146/82    Pulse 72    Ht 5\' 9"  (1.753 m)    Wt 213 lb (96.6 kg)    BMI 31.45 kg/m   Constitutional:  Alert and oriented, No acute distress. HEENT: [place] AT, moist mucus membranes.  Trachea midline, no masses. Cardiovascular: No clubbing, cyanosis, or edema. Respiratory: Normal respiratory effort, no increased work of breathing. Skin: No rashes, bruises or suspicious lesions. Neurologic: Grossly intact, no focal deficits, moving all 4 extremities. Psychiatric: Normal mood and affect.  Laboratory Data:  Lab Results  Component Value Date   CREATININE 1.68 (H) 05/04/2021   Lab Results  Component Value Date   HGBA1C 5.8 (H) 05/04/2021    Assessment & Plan:    Prostate cancer - Prostate biopsy consistent with Intermediate favorable risk, very low-volume with upstaging from low risk now to favorable intermediate risk  - We discussed the roles for active surveillance, radiation therapy, surgical therapy, androgen deprivation, as well as ablative therapy options for the treatment of prostate cancer as appropriate to his individual cancer situation. We discussed the risks and benefits of these options with regard to their impact on cancer control and also in terms of potential  adverse events, complications, and impact on quality of life particularly related to urinary, bowel, and sexual function. The patient was encouraged to ask questions throughout the discussion today and all questions were answered to his stated satisfaction. In addition, the patient was provided with and/or directed to appropriate resources and literature for further education about prostate cancer treatment options.  -We discussed surgical therapy for prostate cancer including the different available surgical approaches.  Specifically, we discussed robotic prostatectomy with pelvic lymph node dissection based on his restratification.  We discussed, in detail, the risks and expectations of surgery with regard to cancer control, urinary control, and erectile dysfunction as well as expected post operative recovery processed. Additional risks of surgery including but not limited to bleeding, infection, hernia formation, nerve damage, fistula formation, bowel/rectal injury, potentially necessitating colostomy, damage to the urinary tract resulting in urinary leakage, urethral stricture, and cardiopulmonary risk such as myocardial infarction, stroke, death, thromboembolism etc. were explained.   - We also discussed oncotype dx assay and intervention. He is not interested at this time, he would like to pursue active surveillance.  Given the very low volume, single core less than 50% of Gleason 3+4, he does remain an active surveillance candidate although we will continue to follow him very closely with serial PSA, repeat MRI and/or biopsy down the road.  He understands and is agreeable this plan.  - Depending on PSA in 6 months he is open to pursuing oncotype dx assay versus intervention.   Return in 6 months with PSA prior  I have reviewed the above documentation for accuracy and completeness, and I agree with the above.   Hollice Espy, MD   [facility] [address], [place], [place] [address] [phone]  I spent 41 total minutes on the day of the encounter including pre-visit review of the medical record, face-to-face time with the patient, and post visit ordering of labs/imaging/tests.  The majority of the time was spent reviewing his pathology as well as treatment options in the room face-to-face with he and his wife.

## 2021-10-10 ENCOUNTER — Encounter: Payer: Self-pay | Admitting: Urology

## 2021-10-10 ENCOUNTER — Ambulatory Visit: Payer: Medicare HMO | Admitting: Urology

## 2021-10-10 ENCOUNTER — Other Ambulatory Visit: Payer: Self-pay

## 2021-10-10 VITALS — BP 146/82 | HR 72 | Ht 69.0 in | Wt 213.0 lb

## 2021-10-10 DIAGNOSIS — C61 Malignant neoplasm of prostate: Secondary | ICD-10-CM | POA: Diagnosis not present

## 2021-10-26 ENCOUNTER — Other Ambulatory Visit: Payer: Self-pay | Admitting: Physician Assistant

## 2021-10-28 NOTE — Patient Instructions (Signed)

## 2021-10-29 MED ORDER — FINASTERIDE 5 MG PO TABS: 5.0000 mg | ORAL_TABLET | Freq: Every day | ORAL | 3 refills | Status: DC

## 2021-10-29 NOTE — Telephone Encounter (Signed)
Refilled finasteride. Pt aware. ?

## 2021-11-01 ENCOUNTER — Encounter: Payer: Self-pay | Admitting: Nurse Practitioner

## 2021-11-01 ENCOUNTER — Ambulatory Visit (INDEPENDENT_AMBULATORY_CARE_PROVIDER_SITE_OTHER): Payer: Medicare HMO | Admitting: Nurse Practitioner

## 2021-11-01 ENCOUNTER — Other Ambulatory Visit: Payer: Self-pay

## 2021-11-01 VITALS — BP 122/75 | HR 77 | Temp 99.1°F | Ht 69.0 in | Wt 210.6 lb

## 2021-11-01 DIAGNOSIS — N1831 Chronic kidney disease, stage 3a: Secondary | ICD-10-CM | POA: Diagnosis not present

## 2021-11-01 DIAGNOSIS — E1121 Type 2 diabetes mellitus with diabetic nephropathy: Secondary | ICD-10-CM

## 2021-11-01 DIAGNOSIS — E1169 Type 2 diabetes mellitus with other specified complication: Secondary | ICD-10-CM

## 2021-11-01 DIAGNOSIS — E669 Obesity, unspecified: Secondary | ICD-10-CM

## 2021-11-01 DIAGNOSIS — C61 Malignant neoplasm of prostate: Secondary | ICD-10-CM

## 2021-11-01 DIAGNOSIS — E1159 Type 2 diabetes mellitus with other circulatory complications: Secondary | ICD-10-CM | POA: Diagnosis not present

## 2021-11-01 DIAGNOSIS — I152 Hypertension secondary to endocrine disorders: Secondary | ICD-10-CM

## 2021-11-01 DIAGNOSIS — E785 Hyperlipidemia, unspecified: Secondary | ICD-10-CM

## 2021-11-01 LAB — MICROALBUMIN, URINE WAIVED
Creatinine, Urine Waived: 300 mg/dL (ref 10–300)
Microalb, Ur Waived: 30 mg/L — ABNORMAL HIGH (ref 0–19)
Microalb/Creat Ratio: <30 mg/g (ref <30)

## 2021-11-01 LAB — BAYER DCA HB A1C WAIVED: HB A1C (BAYER DCA - WAIVED): 5.6 % (ref 4.8–5.6)

## 2021-11-01 MED ORDER — SHINGRIX 50 MCG/0.5ML IM SUSR: 0.5000 mL | Freq: Once | INTRAMUSCULAR | 0 refills | Status: AC

## 2021-11-01 NOTE — Assessment & Plan Note (Signed)
Chronic, ongoing.  Continue current medication regimen and adjust as needed.  Lipid panel today.  Return in 6 months. 

## 2021-11-01 NOTE — Assessment & Plan Note (Signed)
Continue to collaborate with urology, reviewed recent notes and labs. 

## 2021-11-01 NOTE — Assessment & Plan Note (Signed)
Chronic, ongoing with BP below goal today.  Recommend he check BP occasionally at home and document & focus on DASH diet. Continue current medication regimen and adjust as needed.  Urine ALB 30 in March 2023, continue Lisinopril for kidney protection.  BMP and urine ALB today.  Could consider discontinuation of HCTZ if kidney function remains lower side and increase Lisinopril as needed for BP. Return in 6 months. ?

## 2021-11-01 NOTE — Assessment & Plan Note (Signed)
Chronic, ongoing. Continue Lisinopril for kidney protection.  BMP today, urine ALB 30 in March 2023.  Consider nephrology referral if decline noted.  Could consider discontinuation of HCTZ if decline noted and then increase Lisinopril as needed for BP control.   ?

## 2021-11-01 NOTE — Assessment & Plan Note (Signed)
BMI 31.10.  Recommended eating smaller high protein, low fat meals more frequently and exercising 30 mins a day 5 times a week with a goal of 10-15lb weight loss in the next 3 months. Patient voiced their understanding and motivation to adhere to these recommendations. ? ?

## 2021-11-01 NOTE — Progress Notes (Signed)
BP 122/75    Pulse 77    Temp 99.1 F (37.3 C) (Oral)    Ht '5\' 9"'$  (1.753 m)    Wt 210 lb 9.6 oz (95.5 kg)    SpO2 98%    BMI 31.10 kg/m    Subjective:    Patient ID: Fernando Frederick, male    DOB: February 28, 1956, 66 y.o.   MRN: [id number]  HPI: Fernando Frederick is a 66 y.o. male  Chief Complaint  Patient presents with   Diabetes    Patient recent Diabetic Eye Exam requested at today's visit.    Hyperlipidemia   Hypertension   Prostate Cancer   Chronic Kidney Disease   DIABETES Last A1c was 5.8%. Continues on Metformin 500 MG BID, most of the time forgets second dose.  Continues focus on exercise. Hypoglycemic episodes:no Polydipsia/polyuria: no Visual disturbance: no Chest pain: no Paresthesias: no Glucose Monitoring: yes             Accucheck frequency: rarely             Fasting glucose: 100 - 105 range             Post prandial:             Evening:             Before meals: Taking Insulin?: no             Long acting insulin:             Short acting insulin: Blood Pressure Monitoring: not checking Retinal Examination: Up To Date -- Dr. Gloriann Loan just attended Foot Exam: Up to Date Pneumovax: Up to Date Influenza: Not Up to Date Aspirin: yes    HYPERTENSION / HYPERLIPIDEMIA Current Amlodipine, Lisinopril-HCTZ and Crestor. Satisfied with current treatment? yes Duration of hypertension: chronic BP monitoring frequency: not checking BP range:  BP medication side effects: no Duration of hyperlipidemia: chronic Cholesterol medication side effects: no Cholesterol supplements: none Medication compliance: good compliance Aspirin: yes Recent stressors: no Recurrent headaches: no Visual changes: no Palpitations: no Dyspnea: no Chest pain: no Lower extremity edema: no Dizzy/lightheaded: no    CHRONIC KIDNEY DISEASE & PROSTATE CA Continues on Lisinopril for kidney protection.  Is followed by Dr. Erlene Quan for prostate CA and is being monitored there, recent visit  10/10/21. CKD status: stable Medications renally dose: yes Previous renal evaluation: no Pneumovax:  Up to Date Influenza Vaccine: Not Up to Date  Relevant past medical, surgical, family and social history reviewed and updated as indicated. Interim medical history since our last visit reviewed. Allergies and medications reviewed and updated.  Review of Systems  Constitutional:  Negative for activity change, diaphoresis, fatigue and fever.  Respiratory:  Negative for cough, chest tightness, shortness of breath and wheezing.   Cardiovascular:  Negative for chest pain, palpitations and leg swelling.  Gastrointestinal: Negative.   Endocrine: Negative for cold intolerance, heat intolerance, polydipsia, polyphagia and polyuria.  Musculoskeletal: Negative.   Skin: Negative.   Neurological: Negative.   Psychiatric/Behavioral: Negative.     Per HPI unless specifically indicated above     Objective:    BP 122/75    Pulse 77    Temp 99.1 F (37.3 C) (Oral)    Ht '5\' 9"'$  (1.753 m)    Wt 210 lb 9.6 oz (95.5 kg)    SpO2 98%    BMI 31.10 kg/m   Wt Readings from Last 3 Encounters:  11/01/21 210 lb 9.6 oz (  95.5 kg)  10/10/21 213 lb (96.6 kg)  10/03/21 213 lb (96.6 kg)    Physical Exam Vitals and nursing note reviewed.  Constitutional:      General: He is awake. He is not in acute distress.    Appearance: He is well-developed and well-groomed. He is obese. He is not ill-appearing.  HENT:     Head: Normocephalic and atraumatic.     Right Ear: Hearing normal. No drainage.     Left Ear: Hearing normal. No drainage.  Eyes:     General: Lids are normal.        Right eye: No discharge.        Left eye: No discharge.     Conjunctiva/sclera: Conjunctivae normal.     Pupils: Pupils are equal, round, and reactive to light.  Neck:     Thyroid: No thyromegaly.     Vascular: No carotid bruit.     Trachea: Trachea normal.  Cardiovascular:     Rate and Rhythm: Normal rate and regular rhythm.      Heart sounds: Normal heart sounds, S1 normal and S2 normal. No murmur heard.   No gallop.  Pulmonary:     Effort: Pulmonary effort is normal. No accessory muscle usage or respiratory distress.     Breath sounds: Normal breath sounds.  Abdominal:     General: Bowel sounds are normal.     Palpations: Abdomen is soft.  Musculoskeletal:        General: Normal range of motion.     Cervical back: Normal range of motion and neck supple.     Right lower leg: No edema.     Left lower leg: No edema.  Skin:    General: Skin is warm and dry.     Capillary Refill: Capillary refill takes less than 2 seconds.  Neurological:     Mental Status: He is alert and oriented to person, place, and time.     Deep Tendon Reflexes: Reflexes are normal and symmetric.  Psychiatric:        Attention and Perception: Attention normal.        Mood and Affect: Mood normal.        Speech: Speech normal.        Behavior: Behavior normal. Behavior is cooperative.        Thought Content: Thought content normal.   Results for orders placed or performed in visit on 10/03/21  Surgical pathology  Result Value Ref Range   SURGICAL PATHOLOGY      SURGICAL PATHOLOGY CASE: [id number] PATIENT: [facility] Surgical Pathology Report     Specimen Submitted: A. PROSTATE, LEFT BASE B. PROSTATE, LEFT MID C. PROSTATE, LEFT APEX D. PROSTATE, RIGHT BASE E. PROSTATE, RIGHT MID F. PROSTATE, RIGHT APEX G. PROSTATE, LEFT LATERAL BASE H. PROSTATE, LEFT LATERAL MID I. PROSTATE, LEFT LATERAL APEX J. PROSTATE, RIGHT LATERAL BASE K. PROSTATE, RIGHT LATERAL MID L. PROSTATE, RIGHT LATERAL APEX  Clinical History: 66 year old male with a personal history of low risk prostate cancer on finasteride.      DIAGNOSIS: Diagnostic Map   Benign  Atypical  Malignant  HGPIN     Diagnostic Summary  [A] PROSTATE, LEFT BASE:   NEGATIVE FOR MALIGNANCY.  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  [C] PROSTATE, LEFT APEX:   ACINAR ADENOCARCINOMA, GLEASON 3+4=7  (GG 2), INVOLVING 1/1 CORES, MEASURING 1  MM (  10%). 40% PATTERN 4. DISCONTINUOUS FOCI SPA NNING 3MM (30%).  [D] PROSTATE, RIGHT BASE:   NEGATIVE FOR MALIGNANCY.  [E] PROSTATE, RIGHT MID:   NEGATIVE FOR MALIGNANCY.  [F] PROSTATE, RIGHT APEX:   NEGATIVE FOR MALIGNANCY.  [G] PROSTATE, LEFT LATERAL BASE:   NEGATIVE FOR MALIGNANCY.  [H] PROSTATE, LEFT LATERAL MID:   ACINAR ADENOCARCINOMA, GLEASON 3+3=6 (GG 1), INVOLVING 1/1 CORES, MEASURING 6  MM ( 50%).  [I] PROSTATE, LEFT LATERAL APEX:   ACINAR ADENOCARCINOMA, GLEASON 3+3=6 (GG 1), INVOLVING 1/1 CORES, MEASURING 6  MM ( 50%).  [J] PROSTATE, RIGHT LATERAL BASE:   NEGATIVE FOR MALIGNANCY.  [K] PROSTATE, RIGHT LATERAL MID:   NEGATIVE FOR MALIGNANCY.  [L] PROSTATE, RIGHT LATERAL APEX:   NEGATIVE FOR MALIGNANCY.    GROSS DESCRIPTION: A. Labeled: Left base Received: Formalin Collection time: 10:21 AM on 10/03/2021 Placed into formalin time: 10:21 AM on 10/03/2021 Number of needle core biopsy(s): 1 Length: 1.5 cm Diameter: 0.1 cm Description: Needle core biopsy fragment of tan soft tissue Ink: Blue Entirely submi tted in 1 cassette.  B. Labeled: Left mid Received: Formalin Collection time: 10:21 AM on 10/03/2021 Placed into formalin time: 10:21 AM on 10/03/2021 Number of needle core biopsy(s): 1 Length: 1.8 cm Diameter: 0.1 cm Description: Needle core biopsy fragment of tan soft tissue Ink: Blue Entirely submitted in 1 cassette.  C. Labeled: Left apex Received: Formalin Collection time: 10:21 AM on 10/03/2021 Placed into formalin time: 10:21 AM on 10/03/2021 Number of needle core biopsy(s): 1 Length: 1.2 cm Diameter: 0.1 cm Description: Needle core biopsy fragment of tan soft tissue Ink: Blue Entirely submitted in 1 cassette.  D. Labeled: Right base Received: Formalin Collection time: 10:21 AM on 10/03/2021 Placed into  formalin time: 10:21 AM on 10/03/2021 Number of needle core biopsy(s): 1 Length: 1.6 cm Diameter: 0.1 cm Description: Needle core biopsy fragment of tan soft tissue Ink: Blue Entirely submitted in 1 cassette.  E. Labeled: Right mid Received: Formalin Coll ection time: 10:21 AM on 10/03/2021 Placed into formalin time: 10:21 AM on 10/03/2021 Number of needle core biopsy(s): 1 Length: 1.8 cm Diameter: 0.1 cm Description: Needle core biopsy fragment of tan soft tissue Ink: Blue Entirely submitted in 1 cassette.  F. Labeled: Right apex Received: Formalin Collection time: 10:21 AM on 10/03/2021 Placed into formalin time: 10:21 AM on 10/03/2021 Number of needle core biopsy(s): 1 Length: 1.4 cm Diameter: 0.1 cm Description: Needle core biopsy fragment of tan soft tissue Ink: Blue Entirely submitted in 1 cassette.  G. Labeled: Left lateral base Received: Formalin Collection time: 10:21 AM on 10/03/2021 Placed into formalin time: 10:21 AM on 10/03/2021 Number of needle core biopsy(s): 1 Length: 1.1 cm Diameter: 0.1 cm Description: Needle core biopsy fragment of tan soft tissue Ink: Blue Entirely submitted in 1 cassette.  H. Labeled: Left lateral mid Received: Formalin Collection time: 10:21 AM on 10/03/2021 Placed into formali n time: 10:21 AM on 10/03/2021 Number of needle core biopsy(s): 1 Length: 1.6 cm Diameter: 0.1 cm Description: Needle core biopsy fragment of tan soft tissue Ink: Blue Entirely submitted in 1 cassette.  I. Labeled: Left lateral apex Received: Formalin Collection time: 10:21 AM on 10/03/2021 Placed into formalin time: 10:21 AM on 10/03/2021 Number of needle core biopsy(s): 1 Length: 1.4 cm Diameter: 0.1 cm Description: Needle core biopsy fragment of tan soft tissue Ink: Blue Entirely submitted in 1 cassette.  J. Labeled: Right lateral base Received: Formalin Collection time: 10:21 AM on 10/03/2021 Placed into formalin time:  10:21 AM on 10/03/2021 Number of needle  core biopsy(s): 1 Length: 2 cm Diameter: 0.1 cm Description: Needle core biopsy fragment of tan soft tissue Ink: Blue Entirely submitted in 1 cassette.  K. Labeled: Right lateral mid Received: Formalin Collection time: 10:21 AM on 10/03/2021 Placed into formalin time: 10:21 AM on 10/03/2021 Number of needle  core biopsy(s): 1 Length: 1.1 cm Diameter: 0.1 cm Description: Needle core biopsy fragment of tan soft tissue Ink: Blue Entirely submitted in 1 cassette.  L. Labeled: Right lateral apex Received: Formalin Collection time: 10:21 AM on 10/03/2021 Placed into formalin time: 10:21 AM on 10/03/2021 Number of needle core biopsy(s): 1 Length: 1.8 cm Diameter: 0.1 cm Description: Needle core biopsy fragment of tan soft tissue Ink: Blue Entirely submitted in 1 cassette.  RB 10/03/2021   Final Diagnosis performed by Quay Burow, MD.   Electronically signed 10/05/2021 9:50:46AM The electronic signature indicates that the named Attending Pathologist has evaluated the specimen Technical component performed at [facility], [address] [place], [place] [address] Lab: [phone] Dir: Rush Farmer, MD, MMM  Professional component performed at [facility], [facility], [place], [place], [place] [address] Lab: [phone] Dir: Kathi Simpers, MD       Assessment & Plan:   Problem List Items Addressed This Visit       Cardiovascular and Mediastinum   Hypertension associated with diabetes ([place])    Chronic, ongoing with BP below goal today.  Recommend he check BP occasionally at home and document & focus on DASH diet. Continue current medication regimen and adjust as needed.  Urine ALB 30 in March 2023, continue Lisinopril for kidney protection.  BMP and urine ALB today.  Could consider discontinuation of HCTZ if kidney function remains lower side and increase Lisinopril as needed for BP. Return in 6 months.      Relevant Orders   Bayer DCA Hb A1c Waived    Microalbumin, Urine Waived   Basic metabolic panel     Endocrine   Hyperlipidemia associated with type 2 diabetes mellitus (HCC)    Chronic, ongoing.  Continue current medication regimen and adjust as needed.  Lipid panel today.  Return in 6 months.      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  Recent A1C below goal at 5.6% today and urine ALB 30 in March 2023.  At this time trial off Metformin, discussed with him, and alert me if sugars are above 130.  Continue Lisinopril for kidney protection.  Recommend he continue to check BS a few times a week at home.  Return in 6 months for follow-up.        Relevant Orders   Bayer DCA Hb A1c Waived   Microalbumin, Urine Waived   Bayer DCA Hb A1c Waived (STAT)   Microalbumin, Urine Waived (STAT)     Genitourinary   CKD (chronic kidney disease), stage III (HCC)    Chronic, ongoing. Continue Lisinopril for kidney protection.  BMP today, urine ALB 30 in March 2023.  Consider nephrology referral if decline noted.  Could consider discontinuation of HCTZ if decline noted and then increase Lisinopril as needed for BP control.        Relevant Orders   Microalbumin, Urine Waived   Basic metabolic panel   Prostate cancer ([place])    Continue to collaborate with urology, reviewed recent  notes and labs.        Other   Obesity (BMI 30-39.9)    BMI 31.10.  Recommended eating smaller high protein, low fat meals more frequently and exercising 30 mins a day 5 times a week with a goal of 10-15lb weight loss in the next 3 months. Patient voiced their understanding and motivation to adhere to these recommendations.         Follow up plan: Return in about 6 months (around 05/06/2022) for Annual physical.

## 2021-11-01 NOTE — Assessment & Plan Note (Signed)
Chronic, ongoing.  Recent A1C below goal at 5.6% today and urine ALB 30 in March 2023.  At this time trial off Metformin, discussed with him, and alert me if sugars are above 130.  Continue Lisinopril for kidney protection.  Recommend he continue to check BS a few times a week at home.  Return in 6 months for follow-up.   ?

## 2021-11-02 LAB — BASIC METABOLIC PANEL
BUN/Creatinine Ratio: 12 (ref 10–24)
BUN: 19 mg/dL (ref 8–27)
CO2: 20 mmol/L (ref 20–29)
Calcium: 9.5 mg/dL (ref 8.6–10.2)
Chloride: 102 mmol/L (ref 96–106)
Creatinine, Ser: 1.54 mg/dL — ABNORMAL HIGH (ref 0.76–1.27)
Glucose: 117 mg/dL — ABNORMAL HIGH (ref 70–99)
Potassium: 4.2 mmol/L (ref 3.5–5.2)
Sodium: 137 mmol/L (ref 134–144)
eGFR: 49 mL/min/{1.73_m2} — ABNORMAL LOW (ref >59)

## 2021-11-02 LAB — LIPID PANEL W/O CHOL/HDL RATIO
Cholesterol, Total: 135 mg/dL (ref 100–199)
HDL: 37 mg/dL — ABNORMAL LOW (ref >39)
LDL Chol Calc (NIH): 68 mg/dL (ref 0–99)
Triglycerides: 177 mg/dL — ABNORMAL HIGH (ref 0–149)
VLDL Cholesterol Cal: 30 mg/dL (ref 5–40)

## 2021-11-02 NOTE — Progress Notes (Signed)
Contacted via [place] ? ? ?Good evening Gerrit Friends, your labs have returned: ?- Kidney function, creatinine and eGFR, continues to show some Stage 3a kidney disease with some mild improvement this check, no decline -- this is good. ?- Cholesterol levels show LDL at goal.  Continue statin daily.  Any questions for me? ?Keep being amazing!!  Thank you for allowing me to participate in your care.  I appreciate you. ?Kindest regards, ?Felina Tello ?

## 2021-12-05 DIAGNOSIS — M549 Dorsalgia, unspecified: Secondary | ICD-10-CM | POA: Diagnosis not present

## 2021-12-13 IMAGING — MR MR PROSTATE WO/W CM
55 of 56 series · 55 of 56 positions shown · IV contrast (9ml Gadavist)
Comparison: CT pelvis 10/26/2013

CLINICAL DATA: Reported history of Gleason 3+3=6 prostate
adenocarcinoma in [DATE] cores at the left mid gland and apex.

EXAM:
MR PROSTATE WITHOUT AND WITH CONTRAST
TECHNIQUE: Multiplanar multisequence MRI images were obtained of the pelvis
centered about the prostate. Pre and post contrast images were
obtained.
CONTRAST:  9mL GADAVIST GADOBUTROL 1 MMOL/ML IV SOLN

[Series 3: ax in&out whole · axial · 3.0mm · 1.19mm/px · 1 of 88 slices shown (1 of 2)]
[im 1/88]
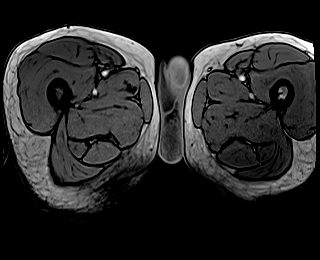

[Series 4: ax in&out whole · axial · 3.0mm · 1.19mm/px · 1 of 88 slices shown (2 of 2)]
[im 1/88]
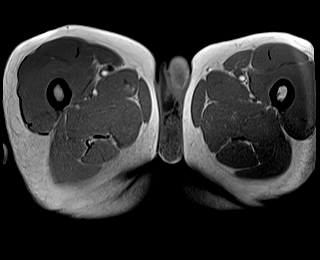

[Series 5: T2 · coronal · 3.0mm · 0.70mm/px · 1 of 35 slices shown (1 of 4)]
[im 1/35]
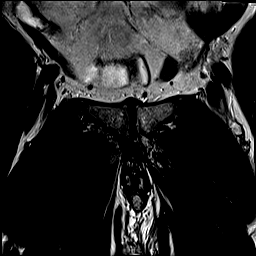

[Series 6: T2 · axial · 3.0mm · 0.56mm/px · 1 of 25 slices shown (2 of 4)]
[im 1/25]
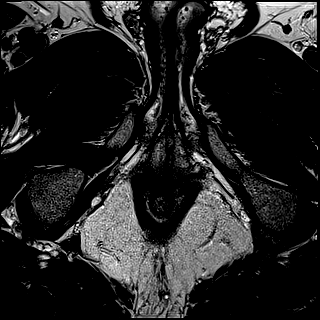

[Series 7: T2 · coronal · 3.0mm · 0.70mm/px · 1 of 35 slices shown (3 of 4)]
[im 1/35]
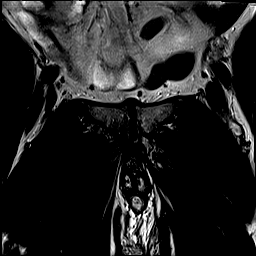

[Series 8: DWI · axial · 3.0mm · 0.86mm/px · 1 of 75 slices shown (1 of 3)]
[im 1/75]
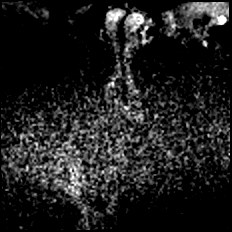

[Series 9: DWI · axial · 3.0mm · 0.86mm/px · 1 of 25 slices shown (2 of 3)]
[im 1/25]
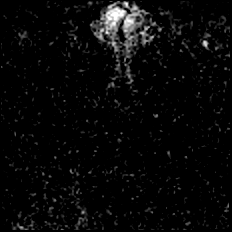

[Series 10: DWI · axial · 3.0mm · 0.86mm/px · 1 of 25 slices shown (3 of 3)]
[im 1/25]
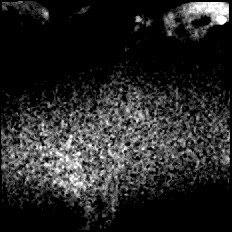

[Series 11: T2 · axial · 1.0mm · 1.04mm/px · 1 of 72 slices shown (4 of 4)]
[im 1/72]
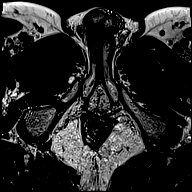

[Series 12: T1 · axial · 3.0mm · 1.15mm/px · 1 of 28 slices shown (1 of 46)]
[im 1/28]
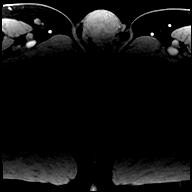

[Series 13: T1 · axial · 3.0mm · 1.15mm/px · 1 of 28 slices shown (2 of 46)]
[im 1/28]
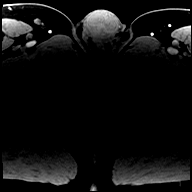

[Series 14: T1 · axial · 3.0mm · 1.15mm/px · 1 of 28 slices shown (3 of 46)]
[im 1/28]
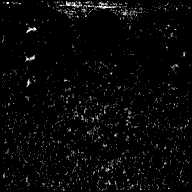

[Series 15: T1 · axial · 3.0mm · 1.15mm/px · 1 of 28 slices shown (4 of 46)]
[im 1/28]
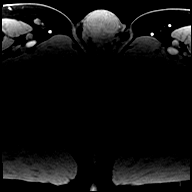

[Series 16: T1 · axial · 3.0mm · 1.15mm/px · 1 of 28 slices shown (5 of 46)]
[im 1/28]
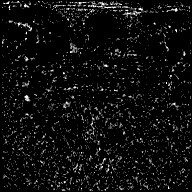

[Series 17: T1 · axial · 3.0mm · 1.15mm/px · 1 of 28 slices shown (6 of 46)]
[im 1/28]
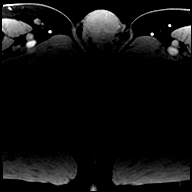

[Series 18: T1 · axial · 3.0mm · 1.15mm/px · 1 of 28 slices shown (7 of 46)]
[im 1/28]
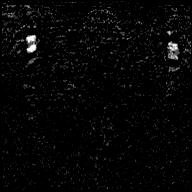

[Series 19: T1 · axial · 3.0mm · 1.15mm/px · 1 of 28 slices shown (8 of 46)]
[im 1/28]
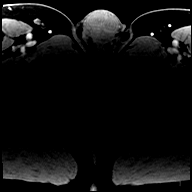

[Series 20: T1 · axial · 3.0mm · 1.15mm/px · 1 of 28 slices shown (9 of 46)]
[im 1/28]
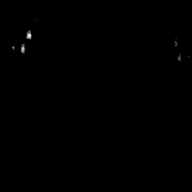

[Series 21: T1 · axial · 3.0mm · 1.15mm/px · 1 of 28 slices shown (10 of 46)]
[im 1/28]
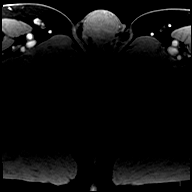

[Series 22: T1 · axial · 3.0mm · 1.15mm/px · 1 of 28 slices shown (11 of 46)]
[im 1/28]
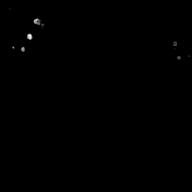

[Series 23: T1 · axial · 3.0mm · 1.15mm/px · 1 of 28 slices shown (12 of 46)]
[im 1/28]
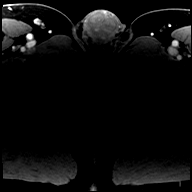

[Series 24: T1 · axial · 3.0mm · 1.15mm/px · 1 of 28 slices shown (13 of 46)]
[im 1/28]
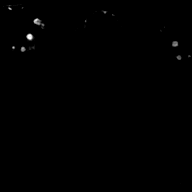

[Series 25: T1 · axial · 3.0mm · 1.15mm/px · 1 of 28 slices shown (14 of 46)]
[im 1/28]
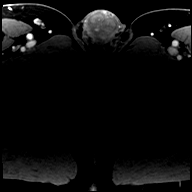

[Series 26: T1 · axial · 3.0mm · 1.15mm/px · 1 of 28 slices shown (15 of 46)]
[im 1/28]
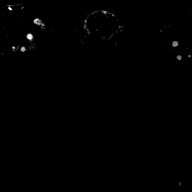

[Series 27: T1 · axial · 3.0mm · 1.15mm/px · 1 of 28 slices shown (16 of 46)]
[im 1/28]
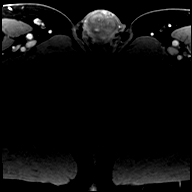

[Series 28: T1 · axial · 3.0mm · 1.15mm/px · 1 of 28 slices shown (17 of 46)]
[im 1/28]
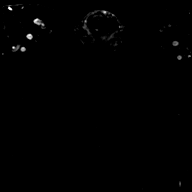

[Series 29: T1 · axial · 3.0mm · 1.15mm/px · 1 of 28 slices shown (18 of 46)]
[im 1/28]
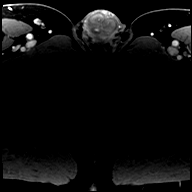

[Series 30: T1 · axial · 3.0mm · 1.15mm/px · 1 of 28 slices shown (19 of 46)]
[im 1/28]
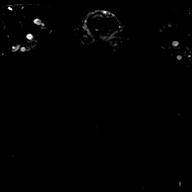

[Series 31: T1 · axial · 3.0mm · 1.15mm/px · 1 of 28 slices shown (20 of 46)]
[im 1/28]
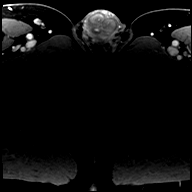

[Series 32: T1 · axial · 3.0mm · 1.15mm/px · 1 of 28 slices shown (21 of 46)]
[im 1/28]
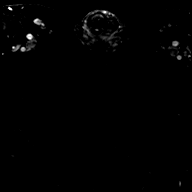

[Series 33: T1 · axial · 3.0mm · 1.15mm/px · 1 of 28 slices shown (22 of 46)]
[im 1/28]
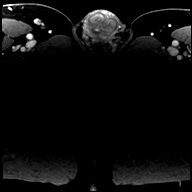

[Series 34: T1 · axial · 3.0mm · 1.15mm/px · 1 of 28 slices shown (23 of 46)]
[im 1/28]
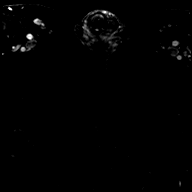

[Series 36: T1 · axial · 3.0mm · 1.15mm/px · 1 of 28 slices shown (24 of 46)]
[im 1/28]
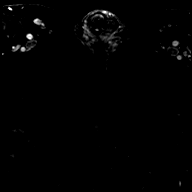

[Series 37: T1 · axial · 3.0mm · 1.15mm/px · 1 of 28 slices shown (25 of 46)]
[im 1/28]
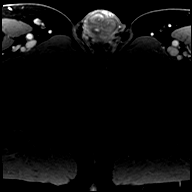

[Series 38: T1 · axial · 3.0mm · 1.15mm/px · 1 of 28 slices shown (26 of 46)]
[im 1/28]
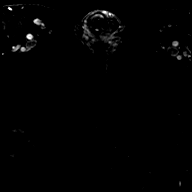

[Series 39: T1 · axial · 3.0mm · 1.15mm/px · 1 of 28 slices shown (27 of 46)]
[im 1/28]
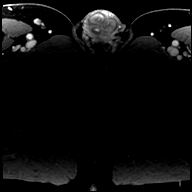

[Series 40: T1 · axial · 3.0mm · 1.15mm/px · 1 of 28 slices shown (28 of 46)]
[im 1/28]
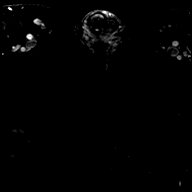

[Series 41: T1 · axial · 3.0mm · 1.15mm/px · 1 of 28 slices shown (29 of 46)]
[im 1/28]
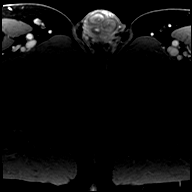

[Series 42: T1 · axial · 3.0mm · 1.15mm/px · 1 of 28 slices shown (30 of 46)]
[im 1/28]
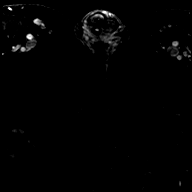

[Series 43: T1 · axial · 3.0mm · 1.15mm/px · 1 of 28 slices shown (31 of 46)]
[im 1/28]
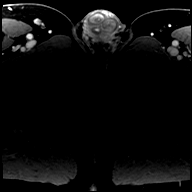

[Series 44: T1 · axial · 3.0mm · 1.15mm/px · 1 of 28 slices shown (32 of 46)]
[im 1/28]
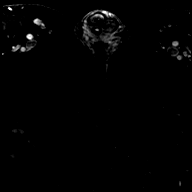

[Series 45: T1 · axial · 3.0mm · 1.15mm/px · 1 of 28 slices shown (33 of 46)]
[im 1/28]
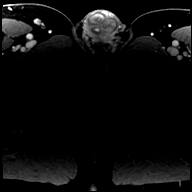

[Series 46: T1 · axial · 3.0mm · 1.15mm/px · 1 of 28 slices shown (34 of 46)]
[im 1/28]
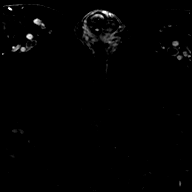

[Series 47: T1 · axial · 3.0mm · 1.15mm/px · 1 of 28 slices shown (35 of 46)]
[im 1/28]
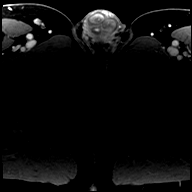

[Series 48: T1 · axial · 3.0mm · 1.15mm/px · 1 of 28 slices shown (36 of 46)]
[im 1/28]
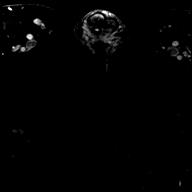

[Series 49: T1 · axial · 3.0mm · 1.15mm/px · 1 of 28 slices shown (37 of 46)]
[im 1/28]
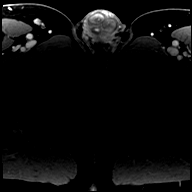

[Series 50: T1 · axial · 3.0mm · 1.15mm/px · 1 of 28 slices shown (38 of 46)]
[im 1/28]
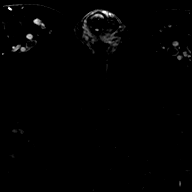

[Series 51: T1 · axial · 3.0mm · 1.15mm/px · 1 of 28 slices shown (39 of 46)]
[im 1/28]
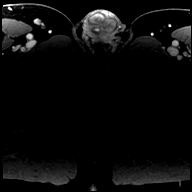

[Series 52: T1 · axial · 3.0mm · 1.15mm/px · 1 of 28 slices shown (40 of 46)]
[im 1/28]
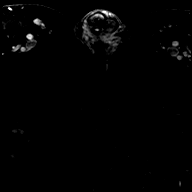

[Series 53: T1 · axial · 3.0mm · 1.15mm/px · 1 of 28 slices shown (41 of 46)]
[im 1/28]
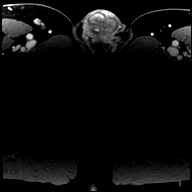

[Series 54: T1 · axial · 3.0mm · 1.15mm/px · 1 of 28 slices shown (42 of 46)]
[im 1/28]
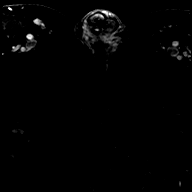

[Series 56: T1 · axial · 3.0mm · 1.15mm/px · 1 of 28 slices shown (43 of 46)]
[im 1/28]
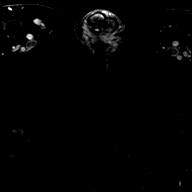

[Series 57: T1 · axial · 3.0mm · 1.15mm/px · 1 of 28 slices shown (44 of 46)]
[im 1/28]
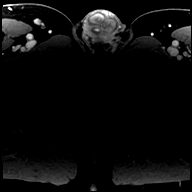

[Series 58: T1 · axial · 3.0mm · 1.15mm/px · 1 of 28 slices shown (45 of 46)]
[im 1/28]
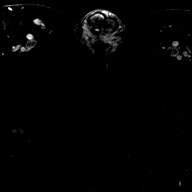

[Series 60: T1 · axial · 3.0mm · 1.15mm/px · 1 of 28 slices shown (46 of 46)]
[im 1/28]
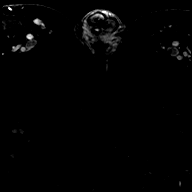

[55 of 56 positions shown; findings below may reference images not displayed]

FINDINGS: Despite efforts by the technologist and patient, motion artifact is
present on today's exam and could not be eliminated. This reduces
exam sensitivity and specificity.

Prostate: Region of interest # 1: PI-RADS category 4 lesion of the
left posterolateral peripheral zone in the mid gland with reduced T2
signal, early enhancement, and focally restricted diffusion (image
17, series 10). This lesion measures 0.76 cubic cm (1.4 by 1.0 by
0.8 cm) and is shown for example on image 43 series 11. This abuts
the capsular margin but without definite transcapsular extension.

Volume: 3D volumetric analysis: Prostate volume 27.08 cubic cm (4.7
by 3.5 by 3.2 cm).

Transcapsular spread:  Absent

Seminal vesicle involvement: Absent

Neurovascular bundle involvement: Absent

Pelvic adenopathy: Absent

Bone metastasis: Absent

Other findings: Degenerative subcortical cyst formation in the right
anterior acetabular wall. Small lipoma in the right gluteus minimus
muscle. Low-level edema and enhancement along the distal left
gluteus minimus tendon. Scattered diverticula of the proximal
sigmoid colon.
IMPRESSION: 1. PI-RADS category 4 lesion of the left posterolateral peripheral
zone in the mid gland, with low T2 signal, restricted diffusion, and
early enhancement. At 1.4 cm in long axis, this is almost large
enough to qualify as PI-RADS category 5. Targeting data sent to
[facility].
2. Incidental findings include degenerative subcortical cyst
formation in the right anterior acetabular wall, a small lipoma in
the right gluteus minimus muscle, and edema/enhancement along the
distal left gluteus minimus tendon.

## 2022-03-11 DIAGNOSIS — E113393 Type 2 diabetes mellitus with moderate nonproliferative diabetic retinopathy without macular edema, bilateral: Secondary | ICD-10-CM | POA: Diagnosis not present

## 2022-04-09 ENCOUNTER — Other Ambulatory Visit: Payer: Medicare HMO

## 2022-04-09 DIAGNOSIS — C61 Malignant neoplasm of prostate: Secondary | ICD-10-CM | POA: Diagnosis not present

## 2022-04-10 LAB — PSA: Prostate Specific Ag, Serum: 4.2 ng/mL — ABNORMAL HIGH (ref 0.0–4.0)

## 2022-04-16 ENCOUNTER — Ambulatory Visit: Payer: Medicare HMO | Admitting: Urology

## 2022-04-16 ENCOUNTER — Encounter: Payer: Self-pay | Admitting: Urology

## 2022-04-16 VITALS — BP 129/82 | HR 71 | Ht 69.0 in | Wt 210.0 lb

## 2022-04-16 DIAGNOSIS — C61 Malignant neoplasm of prostate: Secondary | ICD-10-CM | POA: Diagnosis not present

## 2022-04-16 DIAGNOSIS — R351 Nocturia: Secondary | ICD-10-CM

## 2022-04-16 DIAGNOSIS — R350 Frequency of micturition: Secondary | ICD-10-CM | POA: Diagnosis not present

## 2022-04-16 NOTE — Progress Notes (Signed)
04/16/22 12:14 PM   Fernando Frederick 29-Sep-1955 [id number]  Referring provider:  Venita Lick, NP [address] [place],   [address] Chief Complaint  Patient presents with   Prostate Cancer      HPI:  Fernando Frederick is a 66 y.o.male male with a personal history of low risk prostate cancer on active surveillance who returns today for 6 month follow-up with PSA.   He underwent prostate biopsy on 09/06/2020.  This revealed Gleason 3+3 involving 4 of 12 cores up to 100% of the tissue, at the left mid and apex.   Preprocedure PSA 11/21 was 3.2 on finasteride.TRUS volume 46.25 g.   He underwent prostate MRI to serve as a baseline which shows a PI-RADS 4, nearly 5 lesion at the left posterior lateral peripheral zone in the mid gland measuring 1.4 cm in long axis.  This abutted but did not spread beyond the capsule.  There is no evidence of pelvic lymphadenopathy or metastasis.   He underwent a repeat biopsy on 10/03/2021 surgical pathology was consistent with Gleason 3+3 involving 3 cores affecting up to 50% and Gleason 3+4 involving 1 core affecting 10 %.   His most recent PSA on 04/09/2022 was 4.2.   He has a family history of prostate cancer, brother.  He reports some nighttime frequency. He does drink fluids during the night and admits that he "topped off the tank"  He is accompanied by his wife today.  He and his wife continue to desire active surveillance.  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   Chronic kidney disease    CKD (chronic kidney disease), stage III ([place]) 04/24/2015   Diabetes ([place]) 04/24/2015   Diabetes mellitus without complication ([place])    Gout 04/24/2015   Hyperlipidemia    Hypertension     Surgical History: Past Surgical History:  Procedure Laterality Date   COLONOSCOPY WITH PROPOFOL N/A 12/08/2020   Procedure: COLONOSCOPY WITH PROPOFOL;  Surgeon: Lucilla Lame, MD;  Location: [facility]  ENDOSCOPY;  Service: Endoscopy;  Laterality: N/A;   COLONOSCOPY WITH PROPOFOL N/A 02/20/2021   Procedure: COLONOSCOPY WITH PROPOFOL;  Surgeon: Lucilla Lame, MD;  Location: [facility] ENDOSCOPY;  Service: Endoscopy;  Laterality: N/A;   partial thumb amputation     PROSTATE SURGERY     Prostate Biopsy    Home Medications:  Allergies as of 04/16/2022   No Known Allergies      Medication List        Accurate as of April 16, 2022 12:14 PM. If you have any questions, ask your nurse or doctor.          amLODipine 10 MG tablet Commonly known as: NORVASC Take 1 tablet (10 mg total) by mouth daily.   aspirin 81 MG tablet Take 81 mg by mouth daily.   cyanocobalamin 2000 MCG tablet Take 2,000 mcg by mouth daily. One tablet daily   finasteride 5 MG tablet Commonly known as: PROSCAR Take 1 tablet (5 mg total) by mouth daily.   Fish Oil 1000 MG Caps Take 1,000 mg by mouth daily.   lisinopril-hydrochlorothiazide 20-12.5 MG tablet Commonly known as: ZESTORETIC Take 1 tablet by mouth daily.   metFORMIN 500 MG tablet Commonly known as: GLUCOPHAGE Take 1 tablet (500 mg total) by mouth 2 (two) times daily with a meal.   multivitamin tablet Take 1 tablet by mouth daily.   rosuvastatin 20 MG tablet Commonly known as:  CRESTOR Take 1 tablet (20 mg total) by mouth daily.   Vitamin D-3 25 MCG (1000 UT) Caps Take 1,000 Units by mouth daily.        Allergies:  No Known Allergies  Family History: Family History  Problem Relation Age of Onset   Cancer Mother        lung   Hypertension Mother    Diabetes Father    Hypertension Father    Kidney disease Father    Hypertension Sister    Hypertension Sister    Hypertension Sister    Kidney disease Brother     Social History:  reports that he quit smoking about 17 years ago. His smoking use included cigarettes. He has never used smokeless tobacco. He reports current alcohol use of about 6.0 standard drinks of alcohol per week. He  reports that he does not use drugs.   Physical Exam: BP 129/82   Pulse 71   Ht '5\' 9"'$  (1.753 m)   Wt 210 lb (95.3 kg)   BMI 31.01 kg/m   Constitutional:  Alert and oriented, No acute distress. HEENT: [place] AT, moist mucus membranes.  Trachea midline, no masses. Cardiovascular: No clubbing, cyanosis, or edema. Respiratory: Normal respiratory effort, no increased work of breathing. Skin: No rashes, bruises or suspicious lesions. Neurologic: Grossly intact, no focal deficits, moving all 4 extremities. Psychiatric: Normal mood and affect.  Laboratory Data:  Lab Results  Component Value Date   CREATININE 1.54 (H) 11/01/2021   Lab Results  Component Value Date   HGBA1C 5.6 11/01/2021    Assessment & Plan:   Prostate cancer - Intermediate favorable risk, very low-volume on active surveillance  - PSA stable reassuring - Will reach the 1 year mark since diagnosis in 6 months. May consider repeat biopsy depending on PSA versus prostate MRI -Declined cancer genetics previously, discussed again today. - PSA and DRE; 6 months   2. Urinary frequency/ nocturia  -Primarily behavioral; gust modifications today  - Continue finasteride   Return in 6 months for PSA and DRE  Conley Rolls as a scribe for Hollice Espy, MD.,have documented all relevant documentation on the behalf of Hollice Espy, MD,as directed by  Hollice Espy, MD while in the presence of Hollice Espy, MD.  I have reviewed the above documentation for accuracy and completeness, and I agree with the above.   Hollice Espy, MD  [facility] [address] [place],  [address] [phone]

## 2022-05-05 NOTE — Patient Instructions (Signed)

## 2022-05-06 ENCOUNTER — Ambulatory Visit (INDEPENDENT_AMBULATORY_CARE_PROVIDER_SITE_OTHER): Payer: Medicare HMO | Admitting: Nurse Practitioner

## 2022-05-06 ENCOUNTER — Encounter: Payer: Self-pay | Admitting: Nurse Practitioner

## 2022-05-06 VITALS — BP 125/70 | HR 71 | Temp 98.5°F | Ht 70.0 in | Wt 219.5 lb

## 2022-05-06 DIAGNOSIS — Z1211 Encounter for screening for malignant neoplasm of colon: Secondary | ICD-10-CM

## 2022-05-06 DIAGNOSIS — C61 Malignant neoplasm of prostate: Secondary | ICD-10-CM | POA: Diagnosis not present

## 2022-05-06 DIAGNOSIS — E669 Obesity, unspecified: Secondary | ICD-10-CM | POA: Diagnosis not present

## 2022-05-06 DIAGNOSIS — E1121 Type 2 diabetes mellitus with diabetic nephropathy: Secondary | ICD-10-CM

## 2022-05-06 DIAGNOSIS — E1169 Type 2 diabetes mellitus with other specified complication: Secondary | ICD-10-CM | POA: Diagnosis not present

## 2022-05-06 DIAGNOSIS — Z23 Encounter for immunization: Secondary | ICD-10-CM

## 2022-05-06 DIAGNOSIS — Z0000 Encounter for general adult medical examination without abnormal findings: Secondary | ICD-10-CM

## 2022-05-06 DIAGNOSIS — E1159 Type 2 diabetes mellitus with other circulatory complications: Secondary | ICD-10-CM

## 2022-05-06 DIAGNOSIS — I152 Hypertension secondary to endocrine disorders: Secondary | ICD-10-CM | POA: Diagnosis not present

## 2022-05-06 DIAGNOSIS — E785 Hyperlipidemia, unspecified: Secondary | ICD-10-CM | POA: Diagnosis not present

## 2022-05-06 DIAGNOSIS — E559 Vitamin D deficiency, unspecified: Secondary | ICD-10-CM

## 2022-05-06 DIAGNOSIS — N1831 Chronic kidney disease, stage 3a: Secondary | ICD-10-CM

## 2022-05-06 DIAGNOSIS — Z87891 Personal history of nicotine dependence: Secondary | ICD-10-CM

## 2022-05-06 LAB — BAYER DCA HB A1C WAIVED: HB A1C (BAYER DCA - WAIVED): 6.4 % — ABNORMAL HIGH (ref 4.8–5.6)

## 2022-05-06 MED ORDER — ROSUVASTATIN CALCIUM 20 MG PO TABS: 20.0000 mg | ORAL_TABLET | Freq: Every day | ORAL | 4 refills | Status: DC

## 2022-05-06 MED ORDER — SHINGRIX 50 MCG/0.5ML IM SUSR: 0.5000 mL | Freq: Once | INTRAMUSCULAR | 0 refills | Status: AC

## 2022-05-06 MED ORDER — METFORMIN HCL 500 MG PO TABS: 500.0000 mg | ORAL_TABLET | Freq: Two times a day (BID) | ORAL | 4 refills | Status: DC

## 2022-05-06 MED ORDER — AMLODIPINE BESYLATE 10 MG PO TABS: 10.0000 mg | ORAL_TABLET | Freq: Every day | ORAL | 4 refills | Status: DC

## 2022-05-06 MED ORDER — LISINOPRIL-HYDROCHLOROTHIAZIDE 20-12.5 MG PO TABS: 1.0000 | ORAL_TABLET | Freq: Every day | ORAL | 4 refills | Status: DC

## 2022-05-06 NOTE — Assessment & Plan Note (Signed)
Chronic, ongoing. Continue Lisinopril for kidney protection.  CMP today, urine ALB 30 in March 2023.  Consider nephrology referral if decline noted.  Could consider discontinuation of HCTZ if decline noted and then increase Lisinopril as needed for BP control.

## 2022-05-06 NOTE — Assessment & Plan Note (Signed)
Chronic, stable with BP at goal.  Recommend he check BP occasionally at home and document & focus on DASH diet. Continue current medication regimen and adjust as needed.  Urine ALB 30 in March 2023, continue Lisinopril for kidney protection.  LABS: CBC, CMP, TSH.  Could consider discontinuation of HCTZ if kidney function remains lower side and increase Lisinopril as needed for BP. Return in 6 months.

## 2022-05-06 NOTE — Assessment & Plan Note (Signed)
Chronic, stable.  A1c 6.5% today and urine ALB 30 in March 2023.  At this time continue Metformin 500 MG BID, had a trial lowering but sugars increased at home.  Continue Lisinopril for kidney protection.  Recommend he continue to check BS a few times a week at home.  Return in 6 months for follow-up.

## 2022-05-06 NOTE — Assessment & Plan Note (Signed)
Chronic, ongoing.  Continue current medication regimen and adjust as needed.  Lipid panel today.  Return in 6 months. 

## 2022-05-06 NOTE — Assessment & Plan Note (Signed)
Recommend continued cessation.  Up To Date on AAA screening.

## 2022-05-06 NOTE — Progress Notes (Signed)
BP 125/70   Pulse 71   Temp 98.5 F (36.9 C) (Oral)   Ht '5\' 10"'$  (1.778 m)   Wt 219 lb 8 oz (99.6 kg)   SpO2 97%   BMI 31.49 kg/m    Subjective:    Patient ID: Fernando Frederick, male    DOB: February 28, 1956, 66 y.o.   MRN: [id number]  HPI: Fernando Frederick is a 66 y.o. male presenting on 05/06/2022 for comprehensive medical examination. Current medical complaints include:none  He currently lives with: wife Interim Problems from his last visit: no   He is a former smoker, quit 15-20 years ago.  Smoked for 40 years.  A pack of cigarettes last 2-3 days.    DIABETES A1c 5.8% last visit. Continues on Metformin 500 MG BID, we at.  Continues focus on exercise. Hypoglycemic episodes:no Polydipsia/polyuria: no Visual disturbance: no Chest pain: no Paresthesias: no Glucose Monitoring: yes             Accucheck frequency: rarely             Fasting glucose: 100 range             Post prandial:             Evening:             Before meals: Taking Insulin?: no             Long acting insulin:             Short acting insulin: Blood Pressure Monitoring: not checking Retinal Examination: Up To Date -- due next month with Dr. Curley Spice Exam: Up to Date Pneumovax: Up to Date Influenza: Up to Date Aspirin: yes    HYPERTENSION / HYPERLIPIDEMIA Current Amlodipine, Lisinopril-HCTZ and Crestor. Satisfied with current treatment? yes Duration of hypertension: chronic BP monitoring frequency: not checking BP range:  BP medication side effects: no Duration of hyperlipidemia: chronic Cholesterol medication side effects: no Cholesterol supplements: none Medication compliance: good compliance Aspirin: yes Recent stressors: no Recurrent headaches: no Visual changes: no Palpitations: no Dyspnea: no Chest pain: no Lower extremity edema: no Dizzy/lightheaded: no    CHRONIC KIDNEY DISEASE Continues on Lisinopril for kidney protection.  Continues to follow with urology for prostate CA with  las visit 04/16/22 and PSA performed, he requests another check of this today. CKD status: stable Medications renally dose: yes Previous renal evaluation: no Pneumovax:  Up to Date Influenza Vaccine: Up to Date  Functional Status Survey: Is the patient deaf or have difficulty hearing?: No Does the patient have difficulty seeing, even when wearing glasses/contacts?: No Does the patient have difficulty concentrating, remembering, or making decisions?: No Does the patient have difficulty walking or climbing stairs?: No Does the patient have difficulty dressing or bathing?: No Does the patient have difficulty doing errands alone such as visiting a doctor's office or shopping?: No  FALL RISK:    05/06/2022   10:17 AM 11/01/2021   10:10 AM 05/12/2021   11:13 AM 05/04/2021    2:03 PM 10/29/2019    3:04 PM  [place] in the past year? 0 0 0 0 0  Number falls in past yr: 0 0 0 0 0  Injury with Fall? 0 0 0 0 0  Risk for fall due to : No Fall Risks No Fall Risks  No Fall Risks   Follow up Falls evaluation completed Falls evaluation completed Falls evaluation completed Falls evaluation completed Falls evaluation completed  Depression Screen    05/06/2022   10:17 AM 11/01/2021   10:10 AM 05/12/2021   11:14 AM 05/12/2021   11:13 AM 05/04/2021    2:03 PM  Depression screen PHQ 2/9  Decreased Interest 0 0 0 0 0  Down, Depressed, Hopeless 0 0 0 0 0  PHQ - 2 Score 0 0 0 0 0  Altered sleeping 0 0 1    Tired, decreased energy 0 1 0    Change in appetite 0 0 0    Feeling bad or failure about yourself  0 0 0    Trouble concentrating 0 0 0    Moving slowly or fidgety/restless 0 0 0    Suicidal thoughts 0 0 0    PHQ-9 Score 0 1 1    Difficult doing work/chores Not difficult at all  Not difficult at all      Past Medical History:  Past Medical History:  Diagnosis Date   Benign hypertension with chronic kidney disease 04/24/2015   Chronic kidney disease    CKD (chronic kidney disease),  stage III ([place]) 04/24/2015   Diabetes ([place]) 04/24/2015   Diabetes mellitus without complication (HCC)    Gout 04/24/2015   Hyperlipidemia    Hypertension     Surgical History:  Past Surgical History:  Procedure Laterality Date   COLONOSCOPY WITH PROPOFOL N/A 12/08/2020   Procedure: COLONOSCOPY WITH PROPOFOL;  Surgeon: Lucilla Lame, MD;  Location: [facility] ENDOSCOPY;  Service: Endoscopy;  Laterality: N/A;   COLONOSCOPY WITH PROPOFOL N/A 02/20/2021   Procedure: COLONOSCOPY WITH PROPOFOL;  Surgeon: Lucilla Lame, MD;  Location: [facility] ENDOSCOPY;  Service: Endoscopy;  Laterality: N/A;   partial thumb amputation     PROSTATE SURGERY     Prostate Biopsy    Medications:  Current Outpatient Medications on File Prior to Visit  Medication Sig   aspirin 81 MG tablet Take 81 mg by mouth daily.   Cholecalciferol (VITAMIN D-3) 1000 UNITS CAPS Take 1,000 Units by mouth daily.   cyanocobalamin 2000 MCG tablet Take 2,000 mcg by mouth daily. One tablet daily   finasteride (PROSCAR) 5 MG tablet Take 1 tablet (5 mg total) by mouth daily.   Multiple Vitamin (MULTIVITAMIN) tablet Take 1 tablet by mouth daily.   Omega-3 Fatty Acids (FISH OIL) 1000 MG CAPS Take 1,000 mg by mouth daily.   No current facility-administered medications on file prior to visit.    Allergies:  No Known Allergies  Social History:  Social History   Socioeconomic History   Marital status: Married    Spouse name: Not on file   Number of children: Not on file   Years of education: Not on file   Highest education level: Not on file  Occupational History   Not on file  Tobacco Use   Smoking status: Former    Types: Cigarettes    Quit date: 08/26/2004    Years since quitting: 17.7   Smokeless tobacco: Never  Vaping Use   Vaping Use: Never used  Substance and Sexual Activity   Alcohol use: Yes    Alcohol/week: 6.0 standard drinks of alcohol    Types: 6 Cans of beer per week    Comment: ocassionally    Drug use: No   Sexual  activity: Yes  Other Topics Concern   Not on file  Social History Narrative   Not on file   Social Determinants of Health   Financial Resource Strain: Low Risk  (05/12/2021)   Overall Financial Resource  Strain (CARDIA)    Difficulty of Paying Living Expenses: Not hard at all  Food Insecurity: No Food Insecurity (05/04/2021)   Hunger Vital Sign    Worried About Running Out of Food in the Last Year: Never true    Ran Out of Food in the Last Year: Never true  Transportation Needs: No Transportation Needs (05/12/2021)   PRAPARE - Hydrologist (Medical): No    Lack of Transportation (Non-Medical): No  Physical Activity: Insufficiently Active (05/12/2021)   Exercise Vital Sign    Days of Exercise per Week: 7 days    Minutes of Exercise per Session: 10 min  Stress: No Stress Concern Present (05/12/2021)   [place]    Feeling of Stress : Not at all  Social Connections: Unknown (05/12/2021)   Social Connection and Isolation Panel [NHANES]    Frequency of Communication with Friends and Family: Twice a week    Frequency of Social Gatherings with Friends and Family: Twice a week    Attends Religious Services: 1 to 4 times per year    Active Member of [employer] or Organizations: No    Attends Archivist Meetings: Never    Marital Status: Not on file  Intimate Partner Violence: Not At Risk (05/12/2021)   Humiliation, Afraid, Rape, and Kick questionnaire    Fear of Current or Ex-Partner: No    Emotionally Abused: No    Physically Abused: No    Sexually Abused: No   Social History   Tobacco Use  Smoking Status Former   Types: Cigarettes   Quit date: 08/26/2004   Years since quitting: 17.7  Smokeless Tobacco Never   Social History   Substance and Sexual Activity  Alcohol Use Yes   Alcohol/week: 6.0 standard drinks of alcohol   Types: 6 Cans of beer per week   Comment: ocassionally      Family History:  Family History  Problem Relation Age of Onset   Cancer Mother        lung   Hypertension Mother    Diabetes Father    Hypertension Father    Kidney disease Father    Hypertension Sister    Hypertension Sister    Hypertension Sister    Kidney disease Brother     Past medical history, surgical history, medications, allergies, family history and social history reviewed with patient today and changes made to appropriate areas of the chart.   Review of Systems - negative All other ROS negative except what is listed above and in the HPI.      Objective:    BP 125/70   Pulse 71   Temp 98.5 F (36.9 C) (Oral)   Ht '5\' 10"'$  (1.778 m)   Wt 219 lb 8 oz (99.6 kg)   SpO2 97%   BMI 31.49 kg/m   Wt Readings from Last 3 Encounters:  05/06/22 219 lb 8 oz (99.6 kg)  04/16/22 210 lb (95.3 kg)  11/01/21 210 lb 9.6 oz (95.5 kg)    Physical Exam Vitals and nursing note reviewed.  Constitutional:      General: He is awake. He is not in acute distress.    Appearance: He is well-developed and well-groomed. He is obese. He is not ill-appearing or toxic-appearing.  HENT:     Head: Normocephalic and atraumatic.     Right Ear: Hearing, tympanic membrane, ear canal and external ear normal. No drainage.  Left Ear: Hearing, tympanic membrane, ear canal and external ear normal. No drainage.     Nose: Nose normal.     Mouth/Throat:     Pharynx: Uvula midline.  Eyes:     General: Lids are normal.        Right eye: No discharge.        Left eye: No discharge.     Extraocular Movements: Extraocular movements intact.     Conjunctiva/sclera: Conjunctivae normal.     Pupils: Pupils are equal, round, and reactive to light.     Visual Fields: Right eye visual fields normal and left eye visual fields normal.  Neck:     Thyroid: No thyromegaly.     Vascular: No carotid bruit or JVD.     Trachea: Trachea normal.  Cardiovascular:     Rate and Rhythm: Normal rate and regular  rhythm.     Heart sounds: Normal heart sounds, S1 normal and S2 normal. No murmur heard.    No gallop.  Pulmonary:     Effort: Pulmonary effort is normal. No accessory muscle usage or respiratory distress.     Breath sounds: Normal breath sounds.  Abdominal:     General: Bowel sounds are normal.     Palpations: Abdomen is soft. There is no hepatomegaly or splenomegaly.     Tenderness: There is no abdominal tenderness.  Musculoskeletal:        General: Normal range of motion.     Cervical back: Normal range of motion and neck supple.     Right lower leg: No edema.     Left lower leg: No edema.  Lymphadenopathy:     Head:     Right side of head: No submental, submandibular, tonsillar, preauricular or posterior auricular adenopathy.     Left side of head: No submental, submandibular, tonsillar, preauricular or posterior auricular adenopathy.     Cervical: No cervical adenopathy.  Skin:    General: Skin is warm and dry.     Capillary Refill: Capillary refill takes less than 2 seconds.     Findings: No rash.  Neurological:     Mental Status: He is alert and oriented to person, place, and time.     Gait: Gait is intact.     Deep Tendon Reflexes: Reflexes are normal and symmetric.     Reflex Scores:      Brachioradialis reflexes are 2+ on the right side and 2+ on the left side.      Patellar reflexes are 2+ on the right side and 2+ on the left side. Psychiatric:        Attention and Perception: Attention normal.        Mood and Affect: Mood normal.        Speech: Speech normal.        Behavior: Behavior normal. Behavior is cooperative.        Thought Content: Thought content normal.        Cognition and Memory: Cognition normal.        Judgment: Judgment normal.     Diabetic Foot Exam - Simple   Simple Foot Form Visual Inspection No deformities, no ulcerations, no other skin breakdown bilaterally: Yes Sensation Testing Intact to touch and monofilament testing bilaterally:  Yes Pulse Check Posterior Tibialis and Dorsalis pulse intact bilaterally: Yes Comments     Results for orders placed or performed in visit on 04/09/22  PSA  Result Value Ref Range   Prostate Specific Ag, Serum 4.2 (  H) 0.0 - 4.0 ng/mL      Assessment & Plan:   Problem List Items Addressed This Visit       Cardiovascular and Mediastinum   Hypertension associated with diabetes ([place])    Chronic, stable with BP at goal.  Recommend he check BP occasionally at home and document & focus on DASH diet. Continue current medication regimen and adjust as needed.  Urine ALB 30 in March 2023, continue Lisinopril for kidney protection.  LABS: CBC, CMP, TSH.  Could consider discontinuation of HCTZ if kidney function remains lower side and increase Lisinopril as needed for BP. Return in 6 months.      Relevant Medications   amLODipine (NORVASC) 10 MG tablet   lisinopril-hydrochlorothiazide (ZESTORETIC) 20-12.5 MG tablet   metFORMIN (GLUCOPHAGE) 500 MG tablet   rosuvastatin (CRESTOR) 20 MG tablet   Other Relevant Orders   Bayer DCA Hb A1c Waived   CBC with Differential/Platelet   Comprehensive metabolic panel   TSH     Endocrine   Hyperlipidemia associated with type 2 diabetes mellitus (HCC)    Chronic, ongoing.  Continue current medication regimen and adjust as needed.  Lipid panel today.  Return in 6 months.      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  A1c 6.5% today and urine ALB 30 in March 2023.  At this time continue Metformin 500 MG BID, had a trial lowering but sugars increased at home.  Continue Lisinopril for kidney protection.  Recommend  he continue to check BS a few times a week at home.  Return in 6 months for follow-up.        Relevant Medications   lisinopril-hydrochlorothiazide (ZESTORETIC) 20-12.5 MG tablet   metFORMIN (GLUCOPHAGE) 500 MG tablet   rosuvastatin (CRESTOR) 20 MG tablet   Other Relevant Orders   Bayer DCA Hb A1c Waived     Genitourinary   CKD (chronic kidney disease), stage III (HCC)    Chronic, ongoing. Continue Lisinopril for kidney protection.  CMP today, urine ALB 30 in March 2023.  Consider nephrology referral if decline noted.  Could consider discontinuation of HCTZ if decline noted and then increase Lisinopril as needed for BP control.        Relevant Orders   CBC with Differential/Platelet   Comprehensive metabolic panel   Prostate cancer ([place])    Continue to collaborate with urology, reviewed recent notes and labs.      Relevant Orders   PSA     Other   History of smoking    Recommend continued cessation.  Up To Date on AAA screening.      Obesity (BMI 30-39.9)    BMI 31.49.  Recommended eating smaller high protein, low fat meals more frequently and exercising 30 mins a day 5 times a week with a goal of 10-15lb weight loss in the next 3 months. Patient voiced their understanding and motivation to adhere to these recommendations.       Relevant Medications   metFORMIN (GLUCOPHAGE) 500 MG tablet   Vitamin D deficiency    Chronic, stable.  Continue supplement at home and check Vit D level  today.      Relevant Orders   VITAMIN D 25 Hydroxy (Vit-D Deficiency, Fractures)   Other Visit Diagnoses     Screening for colon cancer       GI referral was placed.   Relevant Orders   Ambulatory referral to Gastroenterology   Need for shingles vaccine       Vaccines ordered to pharmacy.   Relevant Medications   Zoster Vaccine Adjuvanted [facility]) injection   Zoster Vaccine Adjuvanted [facility]) injection (Start on 07/10/2022)   Encounter for annual physical exam       Annual  physical today with labs and health maintenance reviewed, discussed with patient.        Discussed aspirin prophylaxis for myocardial infarction prevention and decision was made to continue ASA  LABORATORY TESTING:  Health maintenance labs ordered today as discussed above.   The natural history of prostate cancer and ongoing controversy regarding screening and potential treatment outcomes of prostate cancer has been discussed with the patient. The meaning of a false positive PSA and a false negative PSA has been discussed. He indicates understanding of the limitations of this screening test and wishes to proceed with screening PSA testing.  These are obtained with urology.   IMMUNIZATIONS:   - Tdap: Tetanus vaccination status reviewed: last tetanus booster within 10 years. - Influenza: Up to date - Pneumovax: Up To Date - Prevnar: Up To Date - Zostavax vaccine: Ordered today  SCREENING: - Colonoscopy: Ordered Today Discussed with patient purpose of the colonoscopy is to detect colon cancer at curable precancerous or early stages   - AAA Screening: Up To Date was negative -Hearing Test: Not applicable  -Spirometry: Not applicable   PATIENT COUNSELING:    Sexuality: Discussed sexually transmitted diseases, partner selection, use of condoms, avoidance of unintended pregnancy  and contraceptive alternatives.   Advised to avoid cigarette smoking.  I discussed with the patient that most people either abstain from alcohol or drink within safe limits (<=14/week and <=4 drinks/occasion for males, <=7/weeks and <= 3 drinks/occasion for females) and that the risk for alcohol disorders and other health effects rises proportionally with the number of drinks per week and how often a drinker exceeds daily limits.  Discussed cessation/primary prevention of drug use and availability of treatment for abuse.   Diet: Encouraged to adjust caloric intake to maintain  or achieve ideal body weight, to  reduce intake of dietary saturated fat and total fat, to limit sodium intake by avoiding high sodium foods and not adding table salt, and to maintain adequate dietary potassium and calcium preferably from fresh fruits, vegetables, and low-fat dairy products.    Stressed the importance of regular exercise  Injury prevention: Discussed safety belts, safety helmets, smoke detector, smoking near bedding or upholstery.   Dental health: Discussed importance of regular tooth brushing, flossing, and dental visits.   Follow up plan: NEXT PREVENTATIVE PHYSICAL DUE IN 1 YEAR. Return in about 6 months (around 11/04/2022) for T2DM, HTN/HLD, CKD, PROSTATE CA.

## 2022-05-06 NOTE — Assessment & Plan Note (Signed)
Continue to collaborate with urology, reviewed recent notes and labs.

## 2022-05-06 NOTE — Assessment & Plan Note (Signed)
Chronic, stable.  Continue supplement at home and check Vit D level today.

## 2022-05-06 NOTE — Assessment & Plan Note (Signed)
BMI 31.49.  Recommended eating smaller high protein, low fat meals more frequently and exercising 30 mins a day 5 times a week with a goal of 10-15lb weight loss in the next 3 months. Patient voiced their understanding and motivation to adhere to these recommendations.

## 2022-05-07 LAB — COMPREHENSIVE METABOLIC PANEL
ALT: 19 IU/L (ref 0–44)
AST: 21 IU/L (ref 0–40)
Albumin/Globulin Ratio: 1.6 (ref 1.2–2.2)
Albumin: 4.5 g/dL (ref 3.9–4.9)
Alkaline Phosphatase: 70 IU/L (ref 44–121)
BUN/Creatinine Ratio: 14 (ref 10–24)
BUN: 22 mg/dL (ref 8–27)
Bilirubin Total: 0.4 mg/dL (ref 0.0–1.2)
CO2: 21 mmol/L (ref 20–29)
Calcium: 9.8 mg/dL (ref 8.6–10.2)
Chloride: 101 mmol/L (ref 96–106)
Creatinine, Ser: 1.53 mg/dL — ABNORMAL HIGH (ref 0.76–1.27)
Globulin, Total: 2.9 g/dL (ref 1.5–4.5)
Glucose: 115 mg/dL — ABNORMAL HIGH (ref 70–99)
Potassium: 4.5 mmol/L (ref 3.5–5.2)
Sodium: 140 mmol/L (ref 134–144)
Total Protein: 7.4 g/dL (ref 6.0–8.5)
eGFR: 49 mL/min/{1.73_m2} — ABNORMAL LOW (ref >59)

## 2022-05-07 LAB — CBC WITH DIFFERENTIAL/PLATELET
Basophils Absolute: 0 10*3/uL (ref 0.0–0.2)
Basos: 1 %
EOS (ABSOLUTE): 0.4 10*3/uL (ref 0.0–0.4)
Eos: 8 %
Hematocrit: 42.9 % (ref 37.5–51.0)
Hemoglobin: 14.2 g/dL (ref 13.0–17.7)
Immature Grans (Abs): 0 10*3/uL (ref 0.0–0.1)
Immature Granulocytes: 0 %
Lymphocytes Absolute: 2 10*3/uL (ref 0.7–3.1)
Lymphs: 46 %
MCH: 27.3 pg (ref 26.6–33.0)
MCHC: 33.1 g/dL (ref 31.5–35.7)
MCV: 82 fL (ref 79–97)
Monocytes Absolute: 0.4 10*3/uL (ref 0.1–0.9)
Monocytes: 8 %
Neutrophils Absolute: 1.6 10*3/uL (ref 1.4–7.0)
Neutrophils: 37 %
Platelets: 278 10*3/uL (ref 150–450)
RBC: 5.21 x10E6/uL (ref 4.14–5.80)
RDW: 13.4 % (ref 11.6–15.4)
WBC: 4.4 10*3/uL (ref 3.4–10.8)

## 2022-05-07 LAB — LIPID PANEL W/O CHOL/HDL RATIO
Cholesterol, Total: 156 mg/dL (ref 100–199)
HDL: 39 mg/dL — ABNORMAL LOW (ref >39)
LDL Chol Calc (NIH): 75 mg/dL (ref 0–99)
Triglycerides: 256 mg/dL — ABNORMAL HIGH (ref 0–149)
VLDL Cholesterol Cal: 42 mg/dL — ABNORMAL HIGH (ref 5–40)

## 2022-05-07 LAB — VITAMIN D 25 HYDROXY (VIT D DEFICIENCY, FRACTURES): Vit D, 25-Hydroxy: 38.9 ng/mL (ref 30.0–100.0)

## 2022-05-07 LAB — TSH: TSH: 0.902 u[IU]/mL (ref 0.450–4.500)

## 2022-05-07 LAB — PSA: Prostate Specific Ag, Serum: 4.3 ng/mL — ABNORMAL HIGH (ref 0.0–4.0)

## 2022-05-07 NOTE — Progress Notes (Signed)
Contacted via [place] evening Aman[facility], your labs have returned: - Kidney function continues to show stable kidney disease stage 3a which we will continue to monitor closely.  Liver function, AST and ALT, is normal. - Cholesterol labs show stable LDL, continue statin. - CBC shows no anemia.  TSH, thyroid, is normal. - PSA continues mild elevation, continue to follow with urology.  Any questions? Keep being amazing!!  Thank you for allowing me to participate in your care.  I appreciate you. Kindest regards, Dennice Tindol

## 2022-05-09 ENCOUNTER — Telehealth: Payer: Self-pay

## 2022-05-09 ENCOUNTER — Other Ambulatory Visit: Payer: Self-pay

## 2022-05-09 DIAGNOSIS — Z86010 Personal history of colonic polyps: Secondary | ICD-10-CM

## 2022-05-09 MED ORDER — CLENPIQ 10-3.5-12 MG-GM -GM/175ML PO SOLN: 175.0000 mL | Freq: Once | ORAL | 0 refills | Status: AC

## 2022-05-09 NOTE — Telephone Encounter (Signed)
Gastroenterology Pre-Procedure Review  Request Date: 06/11/2022 Requesting Physician: Dr. Allen Norris   PATIENT REVIEW QUESTIONS: The patient responded to the following health history questions as indicated:    1. Are you having any GI issues? no 2. Do you have a personal history of Polyps? Yes last one 02/20/2021  3. Do you have a family history of Colon Cancer or Polyps? no 4. Diabetes Mellitus? Yes takes Metformin  5. Joint replacements in the past 12 months?no 6. Major health problems in the past 3 months?no 7. Any artificial heart valves, MVP, or defibrillator?no    MEDICATIONS & ALLERGIES:    Patient reports the following regarding taking any anticoagulation/antiplatelet therapy:   Plavix, Coumadin, Eliquis, Xarelto, Lovenox, Pradaxa, Brilinta, or Effient? no Aspirin? '81mg'$    Patient confirms/reports the following medications:  Current Outpatient Medications  Medication Sig Dispense Refill   amLODipine (NORVASC) 10 MG tablet Take 1 tablet (10 mg total) by mouth daily. 90 tablet 4   aspirin 81 MG tablet Take 81 mg by mouth daily.     Cholecalciferol (VITAMIN D-3) 1000 UNITS CAPS Take 1,000 Units by mouth daily.     cyanocobalamin 2000 MCG tablet Take 2,000 mcg by mouth daily. One tablet daily     finasteride (PROSCAR) 5 MG tablet Take 1 tablet (5 mg total) by mouth daily. 90 tablet 3   lisinopril-hydrochlorothiazide (ZESTORETIC) 20-12.5 MG tablet Take 1 tablet by mouth daily. 90 tablet 4   metFORMIN (GLUCOPHAGE) 500 MG tablet Take 1 tablet (500 mg total) by mouth 2 (two) times daily with a meal. 180 tablet 4   Multiple Vitamin (MULTIVITAMIN) tablet Take 1 tablet by mouth daily.     Omega-3 Fatty Acids (FISH OIL) 1000 MG CAPS Take 1,000 mg by mouth daily.     rosuvastatin (CRESTOR) 20 MG tablet Take 1 tablet (20 mg total) by mouth daily. 90 tablet 4   [START ON 07/10/2022] Zoster Vaccine Adjuvanted [facility]) injection Inject 0.5 mLs into the muscle once for 1 dose. Dose # 2 0.5 mL 0    No current facility-administered medications for this visit.    Patient confirms/reports the following allergies:  No Known Allergies  No orders of the defined types were placed in this encounter.   AUTHORIZATION INFORMATION Primary Insurance: 1D#: Group #:  Secondary Insurance: 1D#: Group #:  SCHEDULE INFORMATION: Date:  Time: Location:

## 2022-05-14 ENCOUNTER — Ambulatory Visit (INDEPENDENT_AMBULATORY_CARE_PROVIDER_SITE_OTHER): Payer: Medicare HMO

## 2022-05-14 DIAGNOSIS — Z0000 Encounter for general adult medical examination without abnormal findings: Secondary | ICD-10-CM | POA: Diagnosis not present

## 2022-05-14 NOTE — Progress Notes (Signed)
I connected with  Fernando Frederick on 05/14/22 by a audio enabled telemedicine application and verified that I am speaking with the correct person using two identifiers.  Patient Location: Home  Provider Location: Office/Clinic  I discussed the limitations of evaluation and management by telemedicine. The patient expressed understanding and agreed to proceed.   Subjective:   Fernando Frederick is a 66 y.o. male who presents for Medicare Annual/Subsequent preventive examination.  Review of Systems    Defer to PCP.     Objective:    Today's Vitals   05/14/22 1337  PainSc: 0-No pain   There is no height or weight on file to calculate BMI.     02/20/2021    7:08 AM 12/08/2020    7:15 AM  Advanced Directives  Does Patient Have a Medical Advance Directive? No No  Would patient like information on creating a medical advance directive? No - Patient declined     Current Medications (verified) Outpatient Encounter Medications as of 05/14/2022  Medication Sig   amLODipine (NORVASC) 10 MG tablet Take 1 tablet (10 mg total) by mouth daily.   aspirin 81 MG tablet Take 81 mg by mouth daily.   Cholecalciferol (VITAMIN D-3) 1000 UNITS CAPS Take 1,000 Units by mouth daily.   cyanocobalamin 2000 MCG tablet Take 2,000 mcg by mouth daily. One tablet daily   finasteride (PROSCAR) 5 MG tablet Take 1 tablet (5 mg total) by mouth daily.   lisinopril-hydrochlorothiazide (ZESTORETIC) 20-12.5 MG tablet Take 1 tablet by mouth daily.   metFORMIN (GLUCOPHAGE) 500 MG tablet Take 1 tablet (500 mg total) by mouth 2 (two) times daily with a meal.   Multiple Vitamin (MULTIVITAMIN) tablet Take 1 tablet by mouth daily.   Omega-3 Fatty Acids (FISH OIL) 1000 MG CAPS Take 1,000 mg by mouth daily.   rosuvastatin (CRESTOR) 20 MG tablet Take 1 tablet (20 mg total) by mouth daily.   [START ON 07/10/2022] Zoster Vaccine Adjuvanted [facility]) injection Inject 0.5 mLs into the muscle once for 1 dose. Dose # 2   No  facility-administered encounter medications on file as of 05/14/2022.    Allergies (verified) Patient has no known allergies.   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   Chronic kidney disease    CKD (chronic kidney disease), stage III ([place]) 04/24/2015   Diabetes ([place]) 04/24/2015   Diabetes mellitus without complication ([place])    Gout 04/24/2015   Hyperlipidemia    Hypertension    Past Surgical History:  Procedure Laterality Date   COLONOSCOPY WITH PROPOFOL N/A 12/08/2020   Procedure: COLONOSCOPY WITH PROPOFOL;  Surgeon: Lucilla Lame, MD;  Location: [facility] ENDOSCOPY;  Service: Endoscopy;  Laterality: N/A;   COLONOSCOPY WITH PROPOFOL N/A 02/20/2021   Procedure: COLONOSCOPY WITH PROPOFOL;  Surgeon: Lucilla Lame, MD;  Location: [facility] ENDOSCOPY;  Service: Endoscopy;  Laterality: N/A;   partial thumb amputation     PROSTATE SURGERY     Prostate Biopsy   Family History  Problem Relation Age of Onset   Cancer Mother        lung   Hypertension Mother    Diabetes Father    Hypertension Father    Kidney disease Father    Hypertension Sister    Hypertension Sister    Hypertension Sister    Kidney disease Brother    Social History   Socioeconomic History   Marital status: Married    Spouse name: Not on file  Number of children: Not on file   Years of education: Not on file   Highest education level: Not on file  Occupational History   Not on file  Tobacco Use   Smoking status: Former    Types: Cigarettes    Quit date: 08/26/2004    Years since quitting: 17.7   Smokeless tobacco: Never  Vaping Use   Vaping Use: Never used  Substance and Sexual Activity   Alcohol use: Yes    Alcohol/week: 6.0 standard drinks of alcohol    Types: 6 Cans of beer per week    Comment: ocassionally    Drug use: No   Sexual activity: Yes  Other Topics Concern   Not on file  Social History Narrative   Not on file   Social  Determinants of Health   Financial Resource Strain: Low Risk  (05/14/2022)   Overall Financial Resource Strain (CARDIA)    Difficulty of Paying Living Expenses: Not hard at all  Food Insecurity: No Food Insecurity (05/14/2022)   Hunger Vital Sign    Worried About Running Out of Food in the Last Year: Never true    Ran Out of Food in the Last Year: Never true  Transportation Needs: No Transportation Needs (05/14/2022)   PRAPARE - Hydrologist (Medical): No    Lack of Transportation (Non-Medical): No  Physical Activity: Insufficiently Active (05/14/2022)   Exercise Vital Sign    Days of Exercise per Week: 7 days    Minutes of Exercise per Session: 20 min  Stress: No Stress Concern Present (05/14/2022)   [place]    Feeling of Stress : Not at all  Social Connections: Moderately Integrated (05/14/2022)   Social Connection and Isolation Panel [NHANES]    Frequency of Communication with Friends and Family: More than three times a week    Frequency of Social Gatherings with Friends and Family: Once a week    Attends Religious Services: More than 4 times per year    Active Member of [employer] or Organizations: No    Attends Music therapist: Never    Marital Status: Married    Tobacco Counseling Counseling given: Not Answered   Clinical Intake:  Pre-visit preparation completed: Yes  Pain : No/denies pain Pain Score: 0-No pain     Nutritional Risks: None Diabetes: Yes CBG done?: No Did pt. bring in CBG monitor from home?: No  How often do you need to have someone help you when you read instructions, pamphlets, or other written materials from your doctor or pharmacy?: 1 - Never What is the last grade level you completed in school?: 12th grade  Diabetic- Yes  Interpreter Needed?: No    Activities of Daily Living    05/14/2022    1:43 PM 05/06/2022   10:54 AM  In your  present state of health, do you have any difficulty performing the following activities:  Hearing? 0 0  Vision? 0 0  Difficulty concentrating or making decisions? 0 0  Walking or climbing stairs? 0 0  Dressing or bathing? 0 0  Doing errands, shopping? 0 0    Patient Care Team: Venita Lick, NP as PCP - General (Nurse Practitioner)  Indicate any recent Medical Services you may have received from other than [facility] providers in the past year (date may be approximate).     Assessment:   This is a routine wellness examination for [place].  Hearing/Vision  screen No results found.  Dietary issues and exercise activities discussed:     Goals Addressed   None   Depression Screen    05/14/2022    1:42 PM 05/06/2022   10:17 AM 11/01/2021   10:10 AM 05/12/2021   11:14 AM 05/12/2021   11:13 AM 05/04/2021    2:03 PM 05/03/2020    2:22 PM  PHQ 2/9 Scores  PHQ - 2 Score 0 0 0 0 0 0 0  PHQ- 9 Score 0 0 '1 1   3    '$ Fall Risk    05/14/2022    1:43 PM 05/06/2022   10:17 AM 11/01/2021   10:10 AM 05/12/2021   11:13 AM 05/04/2021    2:03 PM  [place] in the past year? 0 0 0 0 0  Number falls in past yr: 0 0 0 0 0  Injury with Fall? 0 0 0 0 0  Risk for fall due to : No Fall Risks No Fall Risks No Fall Risks  No Fall Risks  Follow up Falls evaluation completed Falls evaluation completed Falls evaluation completed Falls evaluation completed Falls evaluation completed    [place]:  Any stairs in or around the home? No  If so, are there any without handrails? No  Home free of loose throw rugs in walkways, pet beds, electrical cords, etc? Yes  Adequate lighting in your home to reduce risk of falls? Yes   ASSISTIVE DEVICES UTILIZED TO PREVENT FALLS:  Life alert? No  Use of a cane, walker or w/c? No  Grab bars in the bathroom? No  Shower chair or bench in shower? No  Elevated toilet seat or a handicapped toilet? No   TIMED UP AND GO:  Was the  test performed? No .    Cognitive Function:    05/14/2022    1:44 PM 05/12/2021   11:10 AM  6CIT Screen  What Year? 0 points 0 points  What month? 0 points 0 points  What time? 0 points 0 points  Count back from 20 0 points 0 points  Months in reverse 0 points 0 points  Repeat phrase 0 points 0 points  Total Score 0 points 0 points    Immunizations Immunization History  Administered Date(s) Administered   Fluad Quad(high Dose 65+) 05/03/2020   Influenza,inj,Quad PF,6+ Mos 05/14/2019   Influenza-Unspecified 05/27/2015, 05/27/2016   Moderna Sars-Covid-2 Vaccination 01/11/2020, 02/08/2020, 08/15/2020   Pneumococcal Conjugate-13 05/03/2020   Pneumococcal Polysaccharide-23 01/11/2013, 05/04/2021   Td 03/11/2012   Tdap 04/07/2012    TDAP status: Due, Education has been provided regarding the importance of this vaccine. Advised may receive this vaccine at local pharmacy or Health Dept. Aware to provide a copy of the vaccination record if obtained from local pharmacy or Health Dept. Verbalized acceptance and understanding.  Flu Vaccine status: Due, Education has been provided regarding the importance of this vaccine. Advised may receive this vaccine at local pharmacy or Health Dept. Aware to provide a copy of the vaccination record if obtained from local pharmacy or Health Dept. Verbalized acceptance and understanding.  Pneumococcal vaccine status: Completed during today's visit.  Covid-19 vaccine status: Declined, Education has been provided regarding the importance of this vaccine but patient still declined. Advised may receive this vaccine at local pharmacy or Health Dept.or vaccine clinic. Aware to provide a copy of the vaccination record if obtained from local pharmacy or Health Dept. Verbalized acceptance and understanding.  Qualifies for Shingles Vaccine? Yes   Zostavax completed No   Shingrix Completed?: No.    Education has been provided regarding the importance of this  vaccine. Patient has been advised to call insurance company to determine out of pocket expense if they have not yet received this vaccine. Advised may also receive vaccine at local pharmacy or Health Dept. Verbalized acceptance and understanding.  Screening Tests Health Maintenance  Topic Date Due   OPHTHALMOLOGY EXAM  08/08/2021   COLONOSCOPY (Pts 45-46yr Insurance coverage will need to be confirmed)  02/20/2022   INFLUENZA VACCINE  03/26/2022   COVID-19 Vaccine (4 - Moderna risk series) 05/22/2022 (Originally 10/10/2020)   Zoster Vaccines- Shingrix (1 of 2) 08/05/2022 (Originally 02/04/1975)   TETANUS/TDAP  05/07/2023 (Originally 04/07/2022)   Diabetic kidney evaluation - Urine ACR  11/02/2022   HEMOGLOBIN A1C  11/04/2022   Diabetic kidney evaluation - GFR measurement  05/07/2023   FOOT EXAM  05/07/2023   Pneumonia Vaccine 67 Years old  Completed   Hepatitis C Screening  Completed   HPV VACCINES  Aged Out    Health Maintenance  Health Maintenance Due  Topic Date Due   OPHTHALMOLOGY EXAM  08/08/2021   COLONOSCOPY (Pts 45-448yrInsurance coverage will need to be confirmed)  02/20/2022   INFLUENZA VACCINE  03/26/2022    Colorectal cancer screening: Type of screening: Colonoscopy. Completed 02/20/21. Repeat every 1 years. Next is scheduled for 06/11/22.  Lung Cancer Screening: (Low Dose CT Chest recommended if Age 126-80ears, 30 pack-year currently smoking OR have quit w/in 15years.) does not qualify.   Lung Cancer Screening Referral: N/A  Additional Screening:  Hepatitis C Screening: does qualify; Completed 04/23/16  Vision Screening: Recommended annual ophthalmology exams for early detection of glaucoma and other disorders of the eye. Is the patient up to date with their annual eye exam?  Yes  Who is the provider or what is the name of the office in which the patient attends annual eye exams[facility] pt is not established with a provider, would they like to  be referred to a provider to establish care? No .   Dental Screening: Recommended annual dental exams for proper oral hygiene  Community Resource Referral / Chronic Care Management: CRR required this visit?  No   CCM required this visit?  No      Plan:     I have personally reviewed and noted the following in the patient's chart:   Medical and social history Use of alcohol, tobacco or illicit drugs  Current medications and supplements including opioid prescriptions. Patient is currently taking opioid prescriptions. Information provided to patient regarding non-opioid alternatives. Patient advised to discuss non-opioid treatment plan with their provider. Functional ability and status Nutritional status Physical activity Advanced directives List of other physicians Hospitalizations, surgeries, and ER visits in previous 12 months Vitals Screenings to include cognitive, depression, and falls Referrals and appointments  In addition, I have reviewed and discussed with patient certain preventive protocols, quality metrics, and best practice recommendations. A written personalized care plan for preventive services as well as general preventive health recommendations were provided to patient.     BrGeorgina PeerCMOregon 05/14/2022   Nurse Notes: Non Face to Face 30 minutes    Mr. EnPanico Thank you for taking time to come for your Medicare Wellness Visit. I appreciate your ongoing commitment to your health goals. Please review the following plan we discussed and let me know  if I can assist you in the future.   These are the goals we discussed:  Goals   None     This is a list of the screening recommended for you and due dates:  Health Maintenance  Topic Date Due   Eye exam for diabetics  08/08/2021   Colon Cancer Screening  02/20/2022   Flu Shot  03/26/2022   COVID-19 Vaccine (4 - Moderna risk series) 05/22/2022*   Zoster (Shingles) Vaccine (1 of 2) 08/05/2022*   Tetanus  Vaccine  05/07/2023*   Yearly kidney health urinalysis for diabetes  11/02/2022   Hemoglobin A1C  11/04/2022   Yearly kidney function blood test for diabetes  05/07/2023   Complete foot exam   05/07/2023   Pneumonia Vaccine  Completed   Hepatitis C Screening: USPSTF Recommendation to screen - Ages 93-79 yo.  Completed   HPV Vaccine  Aged Out  *Topic was postponed. The date shown is not the original due date.

## 2022-05-16 ENCOUNTER — Ambulatory Visit (INDEPENDENT_AMBULATORY_CARE_PROVIDER_SITE_OTHER): Payer: Medicare HMO

## 2022-05-16 DIAGNOSIS — Z23 Encounter for immunization: Secondary | ICD-10-CM

## 2022-06-11 ENCOUNTER — Ambulatory Visit: Payer: Medicare HMO | Admitting: Anesthesiology

## 2022-06-11 ENCOUNTER — Encounter
Admission: RE | Disposition: A | Discharge status: Home / Self Care | Payer: Self-pay | Source: Ambulatory Visit | Attending: Gastroenterology

## 2022-06-11 ENCOUNTER — Ambulatory Visit
Admission: RE | Admit: 2022-06-11 | Discharge: 2022-06-11 | Disposition: A | Discharge status: Home / Self Care | Payer: Medicare HMO | Source: Ambulatory Visit | Attending: Gastroenterology | Admitting: Gastroenterology

## 2022-06-11 DIAGNOSIS — D123 Benign neoplasm of transverse colon: Secondary | ICD-10-CM | POA: Insufficient documentation

## 2022-06-11 DIAGNOSIS — Z79899 Other long term (current) drug therapy: Secondary | ICD-10-CM | POA: Insufficient documentation

## 2022-06-11 DIAGNOSIS — E785 Hyperlipidemia, unspecified: Secondary | ICD-10-CM | POA: Insufficient documentation

## 2022-06-11 DIAGNOSIS — E1122 Type 2 diabetes mellitus with diabetic chronic kidney disease: Secondary | ICD-10-CM | POA: Diagnosis not present

## 2022-06-11 DIAGNOSIS — Z86010 Personal history of colonic polyps: Secondary | ICD-10-CM | POA: Diagnosis not present

## 2022-06-11 DIAGNOSIS — Z09 Encounter for follow-up examination after completed treatment for conditions other than malignant neoplasm: Secondary | ICD-10-CM | POA: Insufficient documentation

## 2022-06-11 DIAGNOSIS — D124 Benign neoplasm of descending colon: Secondary | ICD-10-CM | POA: Diagnosis not present

## 2022-06-11 DIAGNOSIS — K5730 Diverticulosis of large intestine without perforation or abscess without bleeding: Secondary | ICD-10-CM | POA: Diagnosis not present

## 2022-06-11 DIAGNOSIS — Z87891 Personal history of nicotine dependence: Secondary | ICD-10-CM | POA: Diagnosis not present

## 2022-06-11 DIAGNOSIS — I129 Hypertensive chronic kidney disease with stage 1 through stage 4 chronic kidney disease, or unspecified chronic kidney disease: Secondary | ICD-10-CM | POA: Diagnosis not present

## 2022-06-11 DIAGNOSIS — K640 First degree hemorrhoids: Secondary | ICD-10-CM | POA: Diagnosis not present

## 2022-06-11 DIAGNOSIS — K635 Polyp of colon: Secondary | ICD-10-CM

## 2022-06-11 DIAGNOSIS — N183 Chronic kidney disease, stage 3 unspecified: Secondary | ICD-10-CM | POA: Diagnosis not present

## 2022-06-11 DIAGNOSIS — Z7984 Long term (current) use of oral hypoglycemic drugs: Secondary | ICD-10-CM | POA: Diagnosis not present

## 2022-06-11 DIAGNOSIS — D122 Benign neoplasm of ascending colon: Secondary | ICD-10-CM | POA: Diagnosis not present

## 2022-06-11 HISTORY — PX: COLONOSCOPY WITH PROPOFOL: SHX5780

## 2022-06-11 LAB — GLUCOSE, CAPILLARY: Glucose-Capillary: 102 mg/dL — ABNORMAL HIGH (ref 70–99)

## 2022-06-11 SURGERY — COLONOSCOPY WITH PROPOFOL
Anesthesia: General

## 2022-06-11 MED ORDER — PROPOFOL 10 MG/ML IV BOLUS
INTRAVENOUS | Status: DC | PRN
  Administered 2022-06-11: 100 mg via INTRAVENOUS

## 2022-06-11 MED ORDER — SODIUM CHLORIDE 0.9 % IV SOLN: INTRAVENOUS | Status: DC

## 2022-06-11 MED ORDER — PROPOFOL 500 MG/50ML IV EMUL
INTRAVENOUS | Status: DC | PRN
  Administered 2022-06-11: 150 ug/kg/min via INTRAVENOUS

## 2022-06-11 MED ORDER — LIDOCAINE HCL (CARDIAC) PF 100 MG/5ML IV SOSY
PREFILLED_SYRINGE | INTRAVENOUS | Status: DC | PRN
  Administered 2022-06-11: 100 mg via INTRAVENOUS

## 2022-06-11 NOTE — Transfer of Care (Signed)
Immediate Anesthesia Transfer of Care Note  Patient: Fernando Frederick  Procedure(s) Performed: COLONOSCOPY WITH PROPOFOL  Patient Location: Endoscopy Unit  Anesthesia Type:General  Level of Consciousness: drowsy  Airway & Oxygen Therapy: Patient Spontanous Breathing  Post-op Assessment: Report given to RN and Post -op Vital signs reviewed and stable  Post vital signs: Reviewed and stable  Last Vitals:  Vitals Value Taken Time  BP    Temp    Pulse    Resp    SpO2      Last Pain:  Vitals:   06/11/22 0948  TempSrc: Temporal         Complications: No notable events documented.

## 2022-06-11 NOTE — H&P (Signed)
Lucilla Lame, MD [facility] [address], [place], [place] [address] Phone:[phone] Fax : [phone]  Primary Care Physician:  Venita Lick, NP Primary Gastroenterologist:  Dr. Allen Norris  Pre-Procedure History & Physical: HPI:  Fernando Frederick is a 66 y.o. male is here for an colonoscopy.   Past Medical History:  Diagnosis Date   Benign hypertension with chronic kidney disease 04/24/2015   Chronic kidney disease    CKD (chronic kidney disease), stage III ([place]) 04/24/2015   Diabetes ([place]) 04/24/2015   Diabetes mellitus without complication ([place])    Gout 04/24/2015   Hyperlipidemia    Hypertension     Past Surgical History:  Procedure Laterality Date   COLONOSCOPY WITH PROPOFOL N/A 12/08/2020   Procedure: COLONOSCOPY WITH PROPOFOL;  Surgeon: Lucilla Lame, MD;  Location: [facility] ENDOSCOPY;  Service: Endoscopy;  Laterality: N/A;   COLONOSCOPY WITH PROPOFOL N/A 02/20/2021   Procedure: COLONOSCOPY WITH PROPOFOL;  Surgeon: Lucilla Lame, MD;  Location: [facility] ENDOSCOPY;  Service: Endoscopy;  Laterality: N/A;   partial thumb amputation     PROSTATE SURGERY     Prostate Biopsy    Prior to Admission medications   Medication Sig Start Date End Date Taking? Authorizing Provider  amLODipine (NORVASC) 10 MG tablet Take 1 tablet (10 mg total) by mouth daily. 05/06/22  Yes Cannady, Jolene T, NP  finasteride (PROSCAR) 5 MG tablet Take 1 tablet (5 mg total) by mouth daily. 10/29/21  Yes Hollice Espy, MD  lisinopril-hydrochlorothiazide (ZESTORETIC) 20-12.5 MG tablet Take 1 tablet by mouth daily. 05/06/22  Yes Cannady, Jolene T, NP  rosuvastatin (CRESTOR) 20 MG tablet Take 1 tablet (20 mg total) by mouth daily. 05/06/22  Yes Cannady, Henrine Screws T, NP  aspirin 81 MG tablet Take 81 mg by mouth daily.    [provider]  Cholecalciferol (VITAMIN D-3) 1000 UNITS CAPS Take 1,000 Units by mouth daily.    [provider]  cyanocobalamin 2000 MCG tablet Take 2,000 mcg by mouth daily. One  tablet daily    [provider]  metFORMIN (GLUCOPHAGE) 500 MG tablet Take 1 tablet (500 mg total) by mouth 2 (two) times daily with a meal. 05/06/22   Cannady, Henrine Screws T, NP  Multiple Vitamin (MULTIVITAMIN) tablet Take 1 tablet by mouth daily.    [provider]  Omega-3 Fatty Acids (FISH OIL) 1000 MG CAPS Take 1,000 mg by mouth daily.    [provider]  Zoster Vaccine Adjuvanted [facility]) injection Inject 0.5 mLs into the muscle once for 1 dose. Dose # 2 07/10/22 07/10/22  Marnee Guarneri T, NP    Allergies as of 05/09/2022   (No Known Allergies)    Family History  Problem Relation Age of Onset   Cancer Mother        lung   Hypertension Mother    Diabetes Father    Hypertension Father    Kidney disease Father    Hypertension Sister    Hypertension Sister    Hypertension Sister    Kidney disease Brother     Social History   Socioeconomic History   Marital status: Married    Spouse name: Not on file   Number of children: Not on file   Years of education: Not on file   Highest education level: Not on file  Occupational History   Not on file  Tobacco Use   Smoking status: Former    Types: Cigarettes    Quit date: 08/26/2004    Years since quitting: 69.8  Smokeless tobacco: Never  Vaping Use   Vaping Use: Never used  Substance and Sexual Activity   Alcohol use: Yes    Alcohol/week: 6.0 standard drinks of alcohol    Types: 6 Cans of beer per week    Comment: ocassionally    Drug use: No   Sexual activity: Yes  Other Topics Concern   Not on file  Social History Narrative   Not on file   Social Determinants of Health   Financial Resource Strain: Low Risk  (05/14/2022)   Overall Financial Resource Strain (CARDIA)    Difficulty of Paying Living Expenses: Not hard at all  Food Insecurity: No Food Insecurity (05/14/2022)   Hunger Vital Sign    Worried About Running Out of Food in the Last Year: Never true    Ran Out of Food in the Last  Year: Never true  Transportation Needs: No Transportation Needs (05/14/2022)   PRAPARE - Hydrologist (Medical): No    Lack of Transportation (Non-Medical): No  Physical Activity: Insufficiently Active (05/14/2022)   Exercise Vital Sign    Days of Exercise per Week: 7 days    Minutes of Exercise per Session: 20 min  Stress: No Stress Concern Present (05/14/2022)   [place]    Feeling of Stress : Not at all  Social Connections: Moderately Integrated (05/14/2022)   Social Connection and Isolation Panel [NHANES]    Frequency of Communication with Friends and Family: More than three times a week    Frequency of Social Gatherings with Friends and Family: Once a week    Attends Religious Services: More than 4 times per year    Active Member of [employer] or Organizations: No    Attends Archivist Meetings: Never    Marital Status: Married  Human resources officer Violence: Not At Risk (05/14/2022)   Humiliation, Afraid, Rape, and Kick questionnaire    Fear of Current or Ex-Partner: No    Emotionally Abused: No    Physically Abused: No    Sexually Abused: No    Review of Systems: See HPI, otherwise negative ROS  Physical Exam: There were no vitals taken for this visit. General:   Alert,  pleasant and cooperative in NAD Head:  Normocephalic and atraumatic. Neck:  Supple; no masses or thyromegaly. Lungs:  Clear throughout to auscultation.    Heart:  Regular rate and rhythm. Abdomen:  Soft, nontender and nondistended. Normal bowel sounds, without guarding, and without rebound.   Neurologic:  Alert and  oriented x4;  grossly normal neurologically.  Impression/Plan: Fernando Frederick is here for an colonoscopy to be performed for a history of adenomatous polyps on 2022    Risks, benefits, limitations, and alternatives regarding  colonoscopy have been reviewed with the patient.  Questions have been  answered.  All parties agreeable.   Lucilla Lame, MD  06/11/2022, 10:04 AM

## 2022-06-11 NOTE — Anesthesia Preprocedure Evaluation (Addendum)
Anesthesia Evaluation  Patient identified by MRN, date of birth, ID band Patient awake    Reviewed: Allergy & Precautions, NPO status , Patient's Chart, lab work & pertinent test results  History of Anesthesia Complications Negative for: history of anesthetic complications  Airway Mallampati: III   Neck ROM: Full    Dental  (+) Missing, Chipped   Pulmonary former smoker (quit 2006),    Pulmonary exam normal breath sounds clear to auscultation       Cardiovascular hypertension, Normal cardiovascular exam Rhythm:Regular Rate:Normal     Neuro/Psych negative neurological ROS     GI/Hepatic negative GI ROS,   Endo/Other  diabetes, Type 2  Renal/GU Renal disease (stage III CKD)     Musculoskeletal Gout    Abdominal   Peds  Hematology negative hematology ROS (+)   Anesthesia Other Findings   Reproductive/Obstetrics                            Anesthesia Physical Anesthesia Plan  ASA: 2  Anesthesia Plan: General   Post-op Pain Management:    Induction: Intravenous  PONV Risk Score and Plan: 2 and Propofol infusion, TIVA and Treatment may vary due to age or medical condition  Airway Management Planned: Natural Airway  Additional Equipment:   Intra-op Plan:   Post-operative Plan:   Informed Consent: I have reviewed the patients History and Physical, chart, labs and discussed the procedure including the risks, benefits and alternatives for the proposed anesthesia with the patient or authorized representative who has indicated his/her understanding and acceptance.       Plan Discussed with: CRNA  Anesthesia Plan Comments: (LMA/GETA backup discussed.  Patient consented for risks of anesthesia including but not limited to:  - adverse reactions to medications - damage to eyes, teeth, lips or other oral mucosa - nerve damage due to positioning  - sore throat or hoarseness - damage  to heart, brain, nerves, lungs, other parts of body or loss of life  Informed patient about role of CRNA in peri- and intra-operative care.  Patient voiced understanding.)        Anesthesia Quick Evaluation

## 2022-06-11 NOTE — Op Note (Signed)
[facility] Patient Name: Fernando Frederick Procedure Date: 06/11/2022 10:16 AM MRN: [id number] Account #: [id number] Date of Birth: 05-17-1956 Admit Type: Outpatient Age: 66 Room: [facility] ENDO ROOM 4 Gender: Male Note Status: Finalized Instrument Name: Park Meo [id number] Procedure:             Colonoscopy Indications:           High risk colon cancer surveillance: Personal history                         of colonic polyps Providers:             Lucilla Lame MD, MD Referring MD:          Barbaraann Faster. Cannady (Referring MD) Medicines:             Propofol per Anesthesia Complications:         No immediate complications. Procedure:             Pre-Anesthesia Assessment:                        - Prior to the procedure, a History and Physical was                         performed, and patient medications and allergies were                         reviewed. The patient's tolerance of previous                         anesthesia was also reviewed. The risks and benefits                         of the procedure and the sedation options and risks                         were discussed with the patient. All questions were                         answered, and informed consent was obtained. Prior                         Anticoagulants: The patient has taken no previous                         anticoagulant or antiplatelet agents. ASA Grade                         Assessment: II - A patient with mild systemic disease.                         After reviewing the risks and benefits, the patient                         was deemed in satisfactory condition to undergo the                         procedure.  After obtaining informed consent, the colonoscope was                         passed under direct vision. Throughout the procedure,                         the patient's blood pressure, pulse, and oxygen                         saturations were  monitored continuously. The                         Colonoscope was introduced through the anus and                         advanced to the the cecum, identified by appendiceal                         orifice and ileocecal valve. The colonoscopy was                         performed without difficulty. The patient tolerated                         the procedure well. The quality of the bowel                         preparation was good. Findings:      The perianal and digital rectal examinations were normal.      Three sessile polyps were found in the transverse colon. The polyps were       2 to 3 mm in size. These polyps were removed with a cold biopsy forceps.       Resection and retrieval were complete.      A 3 mm polyp was found in the ascending colon. The polyp was sessile.       The polyp was removed with a cold biopsy forceps. Resection and       retrieval were complete.      Two sessile polyps were found in the descending colon. The polyps were 2       to 3 mm in size. These polyps were removed with a cold biopsy forceps.       Resection and retrieval were complete.      A 3 mm polyp was found in the sigmoid colon. The polyp was sessile. The       polyp was removed with a cold biopsy forceps. Resection and retrieval       were complete.      Multiple small-mouthed diverticula were found in the sigmoid colon.      Non-bleeding internal hemorrhoids were found during retroflexion. The       hemorrhoids were Grade I (internal hemorrhoids that do not prolapse). Impression:            - Three 2 to 3 mm polyps in the transverse colon,                         removed with a cold biopsy forceps. Resected and  retrieved.                        - One 3 mm polyp in the ascending colon, removed with                         a cold biopsy forceps. Resected and retrieved.                        - Two 2 to 3 mm polyps in the descending colon,                          removed with a cold biopsy forceps. Resected and                         retrieved.                        - One 3 mm polyp in the sigmoid colon, removed with a                         cold biopsy forceps. Resected and retrieved.                        - Diverticulosis in the sigmoid colon.                        - Non-bleeding internal hemorrhoids. Recommendation:        - Discharge patient to home.                        - Resume previous diet.                        - Continue present medications.                        - Await pathology results.                        - Repeat colonoscopy in 3 years for surveillance. Procedure Code(s):     --- Professional ---                        [phone], Colonoscopy, flexible; with biopsy, single or                         multiple Diagnosis Code(s):     --- Professional ---                        Z86.010, Personal history of colonic polyps                        K63.5, Polyp of colon CPT copyright 2019 American Medical Association. All rights reserved. The codes documented in this report are preliminary and upon coder review may  be revised to meet current compliance requirements. Lucilla Lame MD, MD 06/11/2022 10:36:52 AM This report has been signed electronically. Number of Addenda: 0 Note Initiated On: 06/11/2022 10:16 AM Scope Withdrawal Time: 0 hours 8 minutes 15 seconds  Total Procedure Duration: 0  hours 12 minutes 15 seconds  Estimated Blood Loss:  Estimated blood loss: none.      [facility]

## 2022-06-11 NOTE — Anesthesia Postprocedure Evaluation (Signed)
Anesthesia Post Note  Patient: Fernando Frederick  Procedure(s) Performed: COLONOSCOPY WITH PROPOFOL  Patient location during evaluation: PACU Anesthesia Type: General Level of consciousness: awake and alert, oriented and patient cooperative Pain management: pain level controlled Vital Signs Assessment: post-procedure vital signs reviewed and stable Respiratory status: spontaneous breathing, nonlabored ventilation and respiratory function stable Cardiovascular status: blood pressure returned to baseline and stable Postop Assessment: adequate PO intake Anesthetic complications: no   No notable events documented.   Last Vitals:  Vitals:   06/11/22 1048 06/11/22 1058  BP: 104/71 119/80  Pulse: 77 63  Resp: 17 17  Temp:    SpO2: 98% 98%    Last Pain:  Vitals:   06/11/22 1058  TempSrc:   PainSc: 0-No pain                 Darrin Nipper

## 2022-06-12 ENCOUNTER — Encounter: Payer: Self-pay | Admitting: Gastroenterology

## 2022-06-12 LAB — SURGICAL PATHOLOGY

## 2022-06-13 ENCOUNTER — Encounter: Payer: Self-pay | Admitting: Gastroenterology

## 2022-10-17 ENCOUNTER — Other Ambulatory Visit: Payer: Medicare HMO

## 2022-10-17 DIAGNOSIS — C61 Malignant neoplasm of prostate: Secondary | ICD-10-CM | POA: Diagnosis not present

## 2022-10-19 LAB — PSA: Prostate Specific Ag, Serum: 5.5 ng/mL — ABNORMAL HIGH (ref 0.0–4.0)

## 2022-10-22 ENCOUNTER — Ambulatory Visit: Payer: Medicare HMO | Admitting: Urology

## 2022-10-22 VITALS — BP 148/88 | HR 72 | Ht 70.0 in | Wt 218.4 lb

## 2022-10-22 DIAGNOSIS — C61 Malignant neoplasm of prostate: Secondary | ICD-10-CM | POA: Diagnosis not present

## 2022-10-22 NOTE — Progress Notes (Signed)
I,Amy L Pierron,acting as a scribe for Hollice Espy, MD.,have documented all relevant documentation on the behalf of Hollice Espy, MD,as directed by  Hollice Espy, MD while in the presence of Hollice Espy, MD.  10/22/2022 2:31 PM   Fernando Frederick December 11, 1955 [id number]  Referring provider: Venita Lick, NP [address] [place],  [place] [address]  Chief Complaint  Patient presents with   Prostate Cancer    HPI: 67 year-old male with a personal history of prostate cancer presents today for a six month follow up.   He underwent prostate biopsy on 09/06/2020.  This revealed Gleason 3+3 involving 4 of 12 cores up to 100% of the tissue, at the left mid and apex.   Preprocedure PSA 11/21 was 3.2 on finasteride.TRUS volume 46.25 g.   He underwent prostate MRI to serve as a baseline which shows a PI-RADS 4, nearly 5 lesion at the left posterior lateral peripheral zone in the mid gland measuring 1.4 cm in long axis.  This abutted but did not spread beyond the capsule.  There is no evidence of pelvic lymphadenopathy or metastasis.   He underwent a repeat fusion biopsy on 10/03/2021 surgical pathology was consistent with Gleason 3+3 involving 3 cores affecting up to 50% and Gleason 3+4 involving 1 core affecting 10 %.  His most recent PSA on 10/17/2022 was 5.5   He denies any urinary issues. He is accompanied with is wife and they express concern regarding his PSA increase since last time.    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   Chronic kidney disease    CKD (chronic kidney disease), stage III ([place]) 04/24/2015   Diabetes ([place]) 04/24/2015   Diabetes mellitus without complication ([place])    Gout 04/24/2015   Hyperlipidemia    Hypertension     Surgical History: Past Surgical History:  Procedure Laterality Date   COLONOSCOPY WITH PROPOFOL N/A 12/08/2020   Procedure: COLONOSCOPY WITH PROPOFOL;  Surgeon: Lucilla Lame,  MD;  Location: [facility] ENDOSCOPY;  Service: Endoscopy;  Laterality: N/A;   COLONOSCOPY WITH PROPOFOL N/A 02/20/2021   Procedure: COLONOSCOPY WITH PROPOFOL;  Surgeon: Lucilla Lame, MD;  Location: [facility] ENDOSCOPY;  Service: Endoscopy;  Laterality: N/A;   COLONOSCOPY WITH PROPOFOL N/A 06/11/2022   Procedure: COLONOSCOPY WITH PROPOFOL;  Surgeon: Lucilla Lame, MD;  Location: [facility] ENDOSCOPY;  Service: Endoscopy;  Laterality: N/A;   partial thumb amputation     PROSTATE SURGERY     Prostate Biopsy    Home Medications:  Allergies as of 10/22/2022   No Known Allergies      Medication List        Accurate as of October 22, 2022  2:31 PM. If you have any questions, ask your nurse or doctor.          amLODipine 10 MG tablet Commonly known as: NORVASC Take 1 tablet (10 mg total) by mouth daily.   aspirin 81 MG tablet Take 81 mg by mouth daily.   cyanocobalamin 2000 MCG tablet Take 2,000 mcg by mouth daily. One tablet daily   finasteride 5 MG tablet Commonly known as: PROSCAR Take 1 tablet (5 mg total) by mouth daily.   Fish Oil 1000 MG Caps Take 1,000 mg by mouth daily.   lisinopril-hydrochlorothiazide 20-12.5 MG tablet Commonly known as: ZESTORETIC Take 1 tablet by mouth daily.   metFORMIN 500 MG tablet Commonly known as: GLUCOPHAGE Take 1 tablet (500 mg total) by mouth  2 (two) times daily with a meal.   multivitamin tablet Take 1 tablet by mouth daily.   rosuvastatin 20 MG tablet Commonly known as: CRESTOR Take 1 tablet (20 mg total) by mouth daily.   Vitamin D-3 25 MCG (1000 UT) Caps Take 1,000 Units by mouth daily.         Family History: Family History  Problem Relation Age of Onset   Cancer Mother        lung   Hypertension Mother    Diabetes Father    Hypertension Father    Kidney disease Father    Hypertension Sister    Hypertension Sister    Hypertension Sister    Kidney disease Brother     Social History:  reports that he quit smoking about 18  years ago. His smoking use included cigarettes. He has never used smokeless tobacco. He reports current alcohol use of about 6.0 standard drinks of alcohol per week. He reports that he does not use drugs.   Physical Exam: BP (!) 148/88   Pulse 72   Ht '5\' 10"'$  (1.778 m)   Wt 218 lb 6 oz (99.1 kg)   BMI 31.33 kg/m   Constitutional:  Alert and oriented, No acute distress.  Accompanied by wife today. HEENT: [place] AT, moist mucus membranes.  Trachea midline, no masses. Neurologic: Grossly intact, no focal deficits, moving all 4 extremities. Psychiatric: Normal mood and affect.   Assessment & Plan:    Prostate Cancer   - He is no longer an ideal candidate for active surveillance in light of rising PSA. Suspect his overall prostate cancer volume is increasing.   - We discussed options today, including MRI and another repeat fusion biopsy versus going ahead and treating prostate cancer.  - He'd like to implement strict dietary and lifestyle changes for the next three months. If at that time his PSA is the same or worse, he strongly prefers radiation with six months hormones as the treatment (discussed side effects). He's adamantly against radical prostatectomy. We'll make that referral if clinically appropriate.   - If his PSA is coming back down, we'll assess the interval of his next follow-up for continued surveillance.  - Deferred rectal exam today. Is is likely not going to change any of the aforementioned management today.  Return in about 3 months (around 01/20/2023) for will call with results.  I have reviewed the above documentation for accuracy and completeness, and I agree with the above.   Hollice Espy, MD   [facility] [address] [place], [place] [address]  I spent 31 total minutes on the day of the encounter including pre-visit review of the medical record, face-to-face time with the patient, and post visit ordering of  labs/imaging/tests.

## 2022-11-02 NOTE — Patient Instructions (Signed)
Diabetes Mellitus Basics  Diabetes mellitus, or diabetes, is a long-term (chronic) disease. It occurs when the body does not properly use sugar (glucose) that is released from food after you eat. Diabetes mellitus may be caused by one or both of these problems: Your pancreas does not make enough of a hormone called insulin. Your body does not react in a normal way to the insulin that it makes. Insulin lets glucose enter cells in your body. This gives you energy. If you have diabetes, glucose cannot get into cells. This causes high blood glucose (hyperglycemia). How to treat and manage diabetes You may need to take insulin or other diabetes medicines daily to keep your glucose in balance. If you are prescribed insulin, you will learn how to give yourself insulin by injection. You may need to adjust the amount of insulin you take based on the foods that you eat. You will need to check your blood glucose levels using a glucose monitor as told by your health care provider. The readings can help determine if you have low or high blood glucose. Generally, you should have these blood glucose levels: Before meals (preprandial): 80-130 mg/dL (4.4-7.2 mmol/L). After meals (postprandial): below 180 mg/dL (10 mmol/L). Hemoglobin A1c (HbA1c) level: less than 7%. Your health care provider will set treatment goals for you. Keep all follow-up visits. This is important. Follow these instructions at home: Diabetes medicines Take your diabetes medicines every day as told by your health care provider. List your diabetes medicines here: Name of medicine: ______________________________ Amount (dose): _______________ Time (a.m./p.m.): _______________ Notes: ___________________________________ Name of medicine: ______________________________ Amount (dose): _______________ Time (a.m./p.m.): _______________ Notes: ___________________________________ Name of medicine: ______________________________ Amount (dose):  _______________ Time (a.m./p.m.): _______________ Notes: ___________________________________ Insulin If you use insulin, list the types of insulin you use here: Insulin type: ______________________________ Amount (dose): _______________ Time (a.m./p.m.): _______________Notes: ___________________________________ Insulin type: ______________________________ Amount (dose): _______________ Time (a.m./p.m.): _______________ Notes: ___________________________________ Insulin type: ______________________________ Amount (dose): _______________ Time (a.m./p.m.): _______________ Notes: ___________________________________ Insulin type: ______________________________ Amount (dose): _______________ Time (a.m./p.m.): _______________ Notes: ___________________________________ Insulin type: ______________________________ Amount (dose): _______________ Time (a.m./p.m.): _______________ Notes: ___________________________________ Managing blood glucose  Check your blood glucose levels using a glucose monitor as told by your health care provider. Write down the times that you check your glucose levels here: Time: _______________ Notes: ___________________________________ Time: _______________ Notes: ___________________________________ Time: _______________ Notes: ___________________________________ Time: _______________ Notes: ___________________________________ Time: _______________ Notes: ___________________________________ Time: _______________ Notes: ___________________________________  Low blood glucose Low blood glucose (hypoglycemia) is when glucose is at or below 70 mg/dL (3.9 mmol/L). Symptoms may include: Feeling: Hungry. Sweaty and clammy. Irritable or easily upset. Dizzy. Sleepy. Having: A fast heartbeat. A headache. A change in your vision. Numbness around the mouth, lips, or tongue. Having trouble with: Moving (coordination). Sleeping. Treating low blood glucose To treat low blood  glucose, eat or drink something containing sugar right away. If you can think clearly and swallow safely, follow the 15:15 rule: Take 15 grams of a fast-acting carb (carbohydrate), as told by your health care provider. Some fast-acting carbs are: Glucose tablets: take 3-4 tablets. Hard candy: eat 3-5 pieces. Fruit juice: drink 4 oz (120 mL). Regular (not diet) soda: drink 4-6 oz (120-180 mL). Honey or sugar: eat 1 Tbsp (15 mL). Check your blood glucose levels 15 minutes after you take the carb. If your glucose is still at or below 70 mg/dL (3.9 mmol/L), take 15 grams of a carb again. If your glucose does not go above 70 mg/dL (3.9 mmol/L) after   3 tries, get help right away. After your glucose goes back to normal, eat a meal or a snack within 1 hour. Treating very low blood glucose If your glucose is at or below 54 mg/dL (3 mmol/L), you have very low blood glucose (severe hypoglycemia). This is an emergency. Do not wait to see if the symptoms will go away. Get medical help right away. Call your local emergency services (911 in the [place] Do not drive yourself to the hospital. Questions to ask your health care provider Should I talk with a diabetes educator? What equipment will I need to care for myself at home? What diabetes medicines do I need? When should I take them? How often do I need to check my blood glucose levels? What number can I call if I have questions? When is my follow-up visit? Where can I find a support group for people with diabetes? Where to find more information American Diabetes Association: www.diabetes.org Association of Diabetes Care and Education Specialists: www.diabeteseducator.org Contact a health care provider if: Your blood glucose is at or above 240 mg/dL (13.3 mmol/L) for 2 days in a row. You have been sick or have had a fever for 2 days or more, and you are not getting better. You have any of these problems for more than 6 hours: You cannot eat or  drink. You feel nauseous. You vomit. You have diarrhea. Get help right away if: Your blood glucose is lower than 54 mg/dL (3 mmol/L). You get confused. You have trouble thinking clearly. You have trouble breathing. These symptoms may represent a serious problem that is an emergency. Do not wait to see if the symptoms will go away. Get medical help right away. Call your local emergency services (911 in the [place] Do not drive yourself to the hospital. Summary Diabetes mellitus is a chronic disease that occurs when the body does not properly use sugar (glucose) that is released from food after you eat. Take insulin and diabetes medicines as told. Check your blood glucose every day, as often as told. Keep all follow-up visits. This is important. This information is not intended to replace advice given to you by your health care provider. Make sure you discuss any questions you have with your health care provider. Document Revised: 12/14/2019 Document Reviewed: 12/14/2019 Elsevier Patient Education  2023 Elsevier Inc.  

## 2022-11-04 ENCOUNTER — Encounter: Payer: Self-pay | Admitting: Nurse Practitioner

## 2022-11-04 ENCOUNTER — Ambulatory Visit (INDEPENDENT_AMBULATORY_CARE_PROVIDER_SITE_OTHER): Payer: Medicare HMO | Admitting: Nurse Practitioner

## 2022-11-04 VITALS — BP 124/78 | HR 68 | Temp 97.6°F | Ht 70.0 in | Wt 217.3 lb

## 2022-11-04 DIAGNOSIS — C61 Malignant neoplasm of prostate: Secondary | ICD-10-CM

## 2022-11-04 DIAGNOSIS — Z23 Encounter for immunization: Secondary | ICD-10-CM | POA: Diagnosis not present

## 2022-11-04 DIAGNOSIS — E785 Hyperlipidemia, unspecified: Secondary | ICD-10-CM | POA: Diagnosis not present

## 2022-11-04 DIAGNOSIS — E669 Obesity, unspecified: Secondary | ICD-10-CM | POA: Diagnosis not present

## 2022-11-04 DIAGNOSIS — E1121 Type 2 diabetes mellitus with diabetic nephropathy: Secondary | ICD-10-CM

## 2022-11-04 DIAGNOSIS — E1159 Type 2 diabetes mellitus with other circulatory complications: Secondary | ICD-10-CM | POA: Diagnosis not present

## 2022-11-04 DIAGNOSIS — Z87891 Personal history of nicotine dependence: Secondary | ICD-10-CM

## 2022-11-04 DIAGNOSIS — I152 Hypertension secondary to endocrine disorders: Secondary | ICD-10-CM | POA: Diagnosis not present

## 2022-11-04 DIAGNOSIS — N1831 Chronic kidney disease, stage 3a: Secondary | ICD-10-CM | POA: Diagnosis not present

## 2022-11-04 DIAGNOSIS — E1169 Type 2 diabetes mellitus with other specified complication: Secondary | ICD-10-CM | POA: Diagnosis not present

## 2022-11-04 LAB — MICROALBUMIN, URINE WAIVED
Creatinine, Urine Waived: 100 mg/dL (ref 10–300)
Microalb, Ur Waived: 10 mg/L (ref 0–19)
Microalb/Creat Ratio: <30 mg/g (ref <30)

## 2022-11-04 LAB — BAYER DCA HB A1C WAIVED: HB A1C (BAYER DCA - WAIVED): 6.1 % — ABNORMAL HIGH (ref 4.8–5.6)

## 2022-11-04 NOTE — Assessment & Plan Note (Addendum)
Chronic, ongoing. Continue Lisinopril for kidney protection.  CMP today, urine ALB 10 in March 2024.  Consider nephrology referral if decline noted.  Could consider discontinuation of HCTZ if decline noted and then increase Lisinopril as needed for BP control.  Also could consider change from Metformin to [place] in future.

## 2022-11-04 NOTE — Assessment & Plan Note (Signed)
BMI 31.18.  Recommended eating smaller high protein, low fat meals more frequently and exercising 30 mins a day 5 times a week with a goal of 10-15lb weight loss in the next 3 months. Patient voiced their understanding and motivation to adhere to these recommendations.

## 2022-11-04 NOTE — Assessment & Plan Note (Signed)
Recommend continued cessation.  Up To Date on AAA screening. 

## 2022-11-04 NOTE — Progress Notes (Signed)
BP 124/78 (BP Location: Left Arm, Patient Position: Sitting, Cuff Size: Normal)   Pulse 68   Temp 97.6 F (36.4 C) (Oral)   Ht '5\' 10"'$  (1.778 m)   Wt 217 lb 4.8 oz (98.6 kg)   SpO2 99%   BMI 31.18 kg/m    Subjective:    Patient ID: Fernando Frederick, male    DOB: 1955-10-06, 67 y.o.   MRN: [id number]  HPI: Fernando Frederick is a 67 y.o. male  Chief Complaint  Patient presents with   Diabetes    Eye exam scheduled for 12/03/22   Hypertension   Hyperlipidemia   Chronic Kidney Disease   Prostate Cancer   DIABETES Last A1c was 6.4% September. Continues on Metformin 500 MG BID.  Continues focus on exercise. Hypoglycemic episodes:no Polydipsia/polyuria: no Visual disturbance: no Chest pain: no Paresthesias: no Glucose Monitoring: yes             Accucheck frequency: occasionally             Fasting glucose: 117 on Friday             Post prandial:             Evening:             Before meals: Taking Insulin?: no             Long acting insulin:             Short acting insulin: Blood Pressure Monitoring: not checking Retinal Examination: Up To Date -- Dr. Gloriann Loan scheduled 12/03/22 Foot Exam: Up to Date Pneumovax: Up to Date Influenza: Up to Date Aspirin: yes    HYPERTENSION / HYPERLIPIDEMIA Current Amlodipine, Lisinopril-HCTZ and Crestor. Satisfied with current treatment? yes Duration of hypertension: chronic BP monitoring frequency: not checking BP range:  BP medication side effects: no Duration of hyperlipidemia: chronic Cholesterol medication side effects: no Cholesterol supplements: none Medication compliance: good compliance Aspirin: yes Recent stressors: no Recurrent headaches: no Visual changes: no Palpitations: no Dyspnea: no Chest pain: no Lower extremity edema: no Dizzy/lightheaded: no  CHRONIC KIDNEY DISEASE & PROSTATE CA Continues on Lisinopril for kidney protection.  Is followed by Dr. Erlene Quan for prostate CA and is being monitored there, recent  visit 10/22/22.  PSA had been 5.5 at time and they discussed treatment options.  He would like PSA checked today. CKD status: stable Medications renally dose: yes Previous renal evaluation: no Pneumovax:  Up to Date Influenza Vaccine: Not Up to Date  Relevant past medical, surgical, family and social history reviewed and updated as indicated. Interim medical history since our last visit reviewed. Allergies and medications reviewed and updated.  Review of Systems  Constitutional:  Negative for activity change, diaphoresis, fatigue and fever.  Respiratory:  Negative for cough, chest tightness, shortness of breath and wheezing.   Cardiovascular:  Negative for chest pain, palpitations and leg swelling.  Gastrointestinal: Negative.   Endocrine: Negative for cold intolerance, heat intolerance, polydipsia, polyphagia and polyuria.  Musculoskeletal: Negative.   Skin: Negative.   Neurological: Negative.   Psychiatric/Behavioral: Negative.      Per HPI unless specifically indicated above     Objective:    BP 124/78 (BP Location: Left Arm, Patient Position: Sitting, Cuff Size: Normal)   Pulse 68   Temp 97.6 F (36.4 C) (Oral)   Ht '5\' 10"'$  (1.778 m)   Wt 217 lb 4.8 oz (98.6 kg)   SpO2 99%   BMI 31.18 kg/m  Wt Readings from Last 3 Encounters:  11/04/22 217 lb 4.8 oz (98.6 kg)  10/22/22 218 lb 6 oz (99.1 kg)  06/11/22 220 lb (99.8 kg)    Physical Exam Vitals and nursing note reviewed.  Constitutional:      General: He is awake. He is not in acute distress.    Appearance: He is well-developed and well-groomed. He is obese. He is not ill-appearing.  HENT:     Head: Normocephalic and atraumatic.     Right Ear: Hearing normal. No drainage.     Left Ear: Hearing normal. No drainage.  Eyes:     General: Lids are normal.        Right eye: No discharge.        Left eye: No discharge.     Conjunctiva/sclera: Conjunctivae normal.     Pupils: Pupils are equal, round, and reactive to  light.  Neck:     Thyroid: No thyromegaly.     Vascular: No carotid bruit.     Trachea: Trachea normal.  Cardiovascular:     Rate and Rhythm: Normal rate and regular rhythm.     Heart sounds: Normal heart sounds, S1 normal and S2 normal. No murmur heard.    No gallop.  Pulmonary:     Effort: Pulmonary effort is normal. No accessory muscle usage or respiratory distress.     Breath sounds: Normal breath sounds.  Abdominal:     General: Bowel sounds are normal.     Palpations: Abdomen is soft.  Musculoskeletal:        General: Normal range of motion.     Cervical back: Normal range of motion and neck supple.     Right lower leg: No edema.     Left lower leg: No edema.  Skin:    General: Skin is warm and dry.     Capillary Refill: Capillary refill takes less than 2 seconds.  Neurological:     Mental Status: He is alert and oriented to person, place, and time.     Deep Tendon Reflexes: Reflexes are normal and symmetric.  Psychiatric:        Attention and Perception: Attention normal.        Mood and Affect: Mood normal.        Speech: Speech normal.        Behavior: Behavior normal. Behavior is cooperative.        Thought Content: Thought content normal.    Results for orders placed or performed in visit on 10/17/22  PSA  Result Value Ref Range   Prostate Specific Ag, Serum 5.5 (H) 0.0 - 4.0 ng/mL      Assessment & Plan:   Problem List Items Addressed This Visit       Cardiovascular and Mediastinum   Hypertension associated with diabetes ([place])    Chronic, stable.  BP at goal in office today.  Recommend he check BP occasionally at home and document & focus on DASH diet. Continue current medication regimen and adjust as needed.  Urine ALB 03 November 2022, continue Lisinopril for kidney protection.  LABS: CMP and urine ALB.  Could consider discontinuation of HCTZ if kidney function remains lower side and increase Lisinopril as needed for BP. Return in 6 months.      Relevant  Orders   Bayer DCA Hb A1c Waived   Microalbumin, Urine Waived   Comprehensive metabolic panel     Endocrine   Hyperlipidemia associated with type 2 diabetes mellitus ([place])  Chronic, ongoing.  Continue current medication regimen and adjust as needed.  Lipid panel today.  Return in 6 months.      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  A1c 6.1% today and urine ALB 03 November 2022.  At this time continue Metformin 500 MG BID, had a trial lowering but sugars increased at home -- could consider discontinuation of this in future and changing to [place] for more kidney protection, discussed with him today.  Continue Lisinopril for kidney protection.  Recommend he continue to check BS a few times a week at home.  Return in 6 months for follow-up.        Relevant Orders   Bayer DCA Hb A1c Waived   Microalbumin, Urine Waived   Comprehensive metabolic panel     Genitourinary   CKD (chronic kidney disease), stage III (HCC)    Chronic, ongoing. Continue Lisinopril for kidney protection.  CMP today, urine ALB 10 in March 2024.  Consider nephrology referral if decline noted.  Could consider discontinuation of HCTZ if decline noted and then increase Lisinopril as needed for BP control.  Also could consider change from Metformin to [place] in future.      Relevant Orders   Microalbumin, Urine Waived   Comprehensive metabolic panel   PTH, intact and calcium   Prostate cancer ([place])    Ongoing.  Continue to collaborate with urology, reviewed recent notes and labs.  He would like PSA checked today.      Relevant Orders   PSA     Other   History of smoking    Recommend continued cessation.  Up To Date on AAA screening.      Obesity (BMI 30-39.9)    BMI 31.18.  Recommended eating smaller high protein, low fat meals more frequently and exercising 30 mins a  day 5 times a week with a goal of 10-15lb weight loss in the next 3 months. Patient voiced their understanding and motivation to adhere to these recommendations.       Other Visit Diagnoses     Need for Td vaccine       Td vaccine in office today, educated patient.   Relevant Orders   Td vaccine greater than or equal to 7yo preservative free IM (Completed)        Follow up plan: Return in about 6 months (around 05/08/2023) for Annual Physical -- after 05/07/23.

## 2022-11-04 NOTE — Assessment & Plan Note (Signed)
Chronic, stable.  A1c 6.1% today and urine ALB 03 November 2022.  At this time continue Metformin 500 MG BID, had a trial lowering but sugars increased at home -- could consider discontinuation of this in future and changing to [place] for more kidney protection, discussed with him today.  Continue Lisinopril for kidney protection.  Recommend he continue to check BS a few times a week at home.  Return in 6 months for follow-up.

## 2022-11-04 NOTE — Assessment & Plan Note (Signed)
Ongoing.  Continue to collaborate with urology, reviewed recent notes and labs.  He would like PSA checked today.

## 2022-11-04 NOTE — Assessment & Plan Note (Signed)
Chronic, stable.  BP at goal in office today.  Recommend he check BP occasionally at home and document & focus on DASH diet. Continue current medication regimen and adjust as needed.  Urine ALB 03 November 2022, continue Lisinopril for kidney protection.  LABS: CMP and urine ALB.  Could consider discontinuation of HCTZ if kidney function remains lower side and increase Lisinopril as needed for BP. Return in 6 months.

## 2022-11-04 NOTE — Assessment & Plan Note (Signed)
Chronic, ongoing.  Continue current medication regimen and adjust as needed.  Lipid panel today.  Return in 6 months. 

## 2022-11-05 LAB — COMPREHENSIVE METABOLIC PANEL
ALT: 15 IU/L (ref 0–44)
AST: 16 IU/L (ref 0–40)
Albumin/Globulin Ratio: 1.8 (ref 1.2–2.2)
Albumin: 4.7 g/dL (ref 3.9–4.9)
Alkaline Phosphatase: 67 IU/L (ref 44–121)
BUN/Creatinine Ratio: 15 (ref 10–24)
BUN: 26 mg/dL (ref 8–27)
Bilirubin Total: 0.3 mg/dL (ref 0.0–1.2)
CO2: 19 mmol/L — ABNORMAL LOW (ref 20–29)
Calcium: 10 mg/dL (ref 8.6–10.2)
Chloride: 105 mmol/L (ref 96–106)
Creatinine, Ser: 1.72 mg/dL — ABNORMAL HIGH (ref 0.76–1.27)
Globulin, Total: 2.6 g/dL (ref 1.5–4.5)
Glucose: 96 mg/dL (ref 70–99)
Potassium: 4.7 mmol/L (ref 3.5–5.2)
Sodium: 141 mmol/L (ref 134–144)
Total Protein: 7.3 g/dL (ref 6.0–8.5)
eGFR: 43 mL/min/{1.73_m2} — ABNORMAL LOW (ref >59)

## 2022-11-05 LAB — LIPID PANEL W/O CHOL/HDL RATIO
Cholesterol, Total: 129 mg/dL (ref 100–199)
HDL: 37 mg/dL — ABNORMAL LOW (ref >39)
LDL Chol Calc (NIH): 56 mg/dL (ref 0–99)
Triglycerides: 226 mg/dL — ABNORMAL HIGH (ref 0–149)
VLDL Cholesterol Cal: 36 mg/dL (ref 5–40)

## 2022-11-05 LAB — PSA: Prostate Specific Ag, Serum: 4.8 ng/mL — ABNORMAL HIGH (ref 0.0–4.0)

## 2022-11-05 LAB — PTH, INTACT AND CALCIUM: PTH: 14 pg/mL — ABNORMAL LOW (ref 15–65)

## 2022-11-05 NOTE — Progress Notes (Signed)
Contacted via [place] evening [name], your labs have returned: - Kidney function, creatinine and eGFR, continues to show chronic kidney disease stage 3a to b with no significant worsening.  We will continue to monitor and send to nephrology if this ever starts to decline to stage 4 levels.  PTH level is stable, we worry if this is elevated. - Cholesterol levels show LDL at goal, but triglycerides are still elevated.  Continue Rosuvastatin daily and I recommend adding Omega 3 supplement daily, there are gummy versions if this is easier for you to take and can help lower triglycerides.   - PSA trended down a little this check.  Any questions? Keep being amazing!!  Thank you for allowing me to participate in your care.  I appreciate you. Kindest regards, Marlayna Bannister

## 2022-11-07 ENCOUNTER — Other Ambulatory Visit: Payer: Self-pay | Admitting: Urology

## 2022-12-03 DIAGNOSIS — E113393 Type 2 diabetes mellitus with moderate nonproliferative diabetic retinopathy without macular edema, bilateral: Secondary | ICD-10-CM | POA: Diagnosis not present

## 2022-12-03 DIAGNOSIS — H524 Presbyopia: Secondary | ICD-10-CM | POA: Diagnosis not present

## 2023-01-21 ENCOUNTER — Other Ambulatory Visit: Payer: Medicare HMO

## 2023-01-21 DIAGNOSIS — C61 Malignant neoplasm of prostate: Secondary | ICD-10-CM

## 2023-01-22 ENCOUNTER — Other Ambulatory Visit: Payer: Medicare HMO

## 2023-01-22 LAB — PSA: Prostate Specific Ag, Serum: 6 ng/mL — ABNORMAL HIGH (ref 0.0–4.0)

## 2023-01-23 ENCOUNTER — Other Ambulatory Visit: Payer: Self-pay

## 2023-01-23 DIAGNOSIS — R972 Elevated prostate specific antigen [PSA]: Secondary | ICD-10-CM

## 2023-01-23 DIAGNOSIS — C61 Malignant neoplasm of prostate: Secondary | ICD-10-CM

## 2023-01-28 ENCOUNTER — Telehealth: Payer: Self-pay

## 2023-01-28 NOTE — Telephone Encounter (Signed)
[place], [place], CMA  Jenevieve Kirschbaum, [place], CMA Good morning,  This pt's insurance does not require a PA for Eligard, you may schedule him at your convenience. Thanks!  -OG  Patient advised and scheduled.

## 2023-02-03 ENCOUNTER — Ambulatory Visit: Payer: Medicare HMO | Admitting: Physician Assistant

## 2023-02-03 ENCOUNTER — Ambulatory Visit
Admission: RE | Admit: 2023-02-03 | Discharge: 2023-02-03 | Disposition: A | Discharge status: Home / Self Care | Payer: Medicare HMO | Source: Ambulatory Visit | Attending: Radiation Oncology | Admitting: Radiation Oncology

## 2023-02-03 ENCOUNTER — Other Ambulatory Visit: Payer: Self-pay | Admitting: Physician Assistant

## 2023-02-03 ENCOUNTER — Encounter: Payer: Self-pay | Admitting: Radiation Oncology

## 2023-02-03 ENCOUNTER — Encounter: Payer: Self-pay | Admitting: Physician Assistant

## 2023-02-03 VITALS — BP 121/75 | HR 65 | Temp 96.3°F | Resp 15 | Ht 70.0 in | Wt 210.7 lb

## 2023-02-03 DIAGNOSIS — Z79899 Other long term (current) drug therapy: Secondary | ICD-10-CM | POA: Insufficient documentation

## 2023-02-03 DIAGNOSIS — E1122 Type 2 diabetes mellitus with diabetic chronic kidney disease: Secondary | ICD-10-CM | POA: Insufficient documentation

## 2023-02-03 DIAGNOSIS — C61 Malignant neoplasm of prostate: Secondary | ICD-10-CM

## 2023-02-03 DIAGNOSIS — E785 Hyperlipidemia, unspecified: Secondary | ICD-10-CM | POA: Insufficient documentation

## 2023-02-03 DIAGNOSIS — R351 Nocturia: Secondary | ICD-10-CM | POA: Insufficient documentation

## 2023-02-03 DIAGNOSIS — Z87891 Personal history of nicotine dependence: Secondary | ICD-10-CM | POA: Insufficient documentation

## 2023-02-03 DIAGNOSIS — Z7984 Long term (current) use of oral hypoglycemic drugs: Secondary | ICD-10-CM | POA: Insufficient documentation

## 2023-02-03 DIAGNOSIS — N183 Chronic kidney disease, stage 3 unspecified: Secondary | ICD-10-CM | POA: Insufficient documentation

## 2023-02-03 DIAGNOSIS — Z7982 Long term (current) use of aspirin: Secondary | ICD-10-CM | POA: Diagnosis not present

## 2023-02-03 DIAGNOSIS — Z191 Hormone sensitive malignancy status: Secondary | ICD-10-CM | POA: Diagnosis not present

## 2023-02-03 DIAGNOSIS — I129 Hypertensive chronic kidney disease with stage 1 through stage 4 chronic kidney disease, or unspecified chronic kidney disease: Secondary | ICD-10-CM | POA: Insufficient documentation

## 2023-02-03 MED ORDER — LEUPROLIDE ACETATE (6 MONTH) 45 MG ~~LOC~~ KIT
45.0000 mg | PACK | Freq: Once | SUBCUTANEOUS | Status: AC
  Administered 2023-02-03: 45 mg via SUBCUTANEOUS

## 2023-02-03 NOTE — Progress Notes (Signed)
Patient presented to clinic today for initiation of 6 months of ADT with Eligard per Drs. Brandon & Chrystal.   Patient reports staying active with a treadmill and weight machine in his home.  He has a history of DM2, HLD, HTN.  We discussed the anticipated side effects of ADT today including hot flashes, decreased libido, erectile dysfunction, breast tenderness or size changes, fatigue, weight gain, bone loss, muscle mass loss, brain fog, increased cholesterol, increased blood pressure, increased blood sugar, and increased risk for stroke and heart attack.  I counseled him to maintain a healthy diet and continue regular exercise including weightbearing exercise to mitigate bone and muscle loss for the duration of therapy.  Additionally, I counseled him to start daily calcium 1000-1200mg  and vitamin D 800-1000IU supplements to reduce bone loss.  Lastly, I encouraged him to maintain regular follow-ups with his PCP for monitoring of his metabolic syndrome. He expressed understanding, all questions answered. Printed resources on ADT provided today.  Carman Ching, PA-C 02/03/23 1:27 PM  I spent 12 minutes on the day of the encounter to include pre-visit record review, face-to-face time with the patient, and post-visit ordering of tests.

## 2023-02-03 NOTE — Consult Note (Signed)
NEW PATIENT EVALUATION  Name: Fernando Frederick  MRN: [id number]  Date:   02/03/2023     DOB: [birth year]   This 67 y.o. 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  6 range  REFERRING PHYSICIAN: Marjie Skiff, NP  CHIEF COMPLAINT:  Chief Complaint  Patient presents with   Prostate Cancer    Consult    DIAGNOSIS: The encounter diagnosis was Prostate cancer (HCC).   PREVIOUS INVESTIGATIONS:  Pathology report reviewed MRI scans reviewed Clinical notes reviewed  HPI: Patient is a 67 year old male's been followed since 2022 when he had prostate biopsy based on elevated PSA of Gleason 6 in 4 of 12 cores.    MRI scan at that time showed a category 4 lesion of left posterior lateral peripheral zone in the mid gland.  No evidence of extracapsular extension or pelvic adenopathy was identified.  He elected for active surveillance he is PSA has risen to 6 recently.  Back in February 2023 he had a repeat biopsy showing 1 core positive for Gleason 7 (3+4).  His PSA continues to rise prompting this time recommendation for treatment.  He has declined surgery.  He is seen today for radiation oncology opinion he specifically denies urinary frequency urgency.  Does have nocturia x 3-4.  No bowel problems or bone pain.  PLANNED TREATMENT REGIMEN: IMRT image guided radiation therapy +6 months of Eligard  PAST MEDICAL HISTORY:  has a past medical history of Benign hypertension with chronic kidney disease (04/24/2015), Chronic kidney disease, CKD (chronic kidney disease), stage III (HCC) (04/24/2015), Diabetes (HCC) (04/24/2015), Diabetes mellitus without complication (HCC), Gout (04/24/2015), Hyperlipidemia, and Hypertension.    PAST SURGICAL HISTORY:  Past Surgical History:  Procedure Laterality Date   COLONOSCOPY WITH PROPOFOL N/A 12/08/2020   Procedure: COLONOSCOPY WITH PROPOFOL;  Surgeon: Midge Minium, MD;  Location[facility] ENDOSCOPY;  Service: Endoscopy;  Laterality: N/A;   COLONOSCOPY WITH PROPOFOL N/A 02/20/2021   Procedure: COLONOSCOPY WITH PROPOFOL;  Surgeon: Midge Minium, MD;  Location: [facility] ENDOSCOPY;  Service: Endoscopy;  Laterality: N/A;   COLONOSCOPY WITH PROPOFOL N/A 06/11/2022   Procedure: COLONOSCOPY WITH PROPOFOL;  Surgeon: Midge Minium, MD;  Location: [facility] ENDOSCOPY;  Service: Endoscopy;  Laterality: N/A;   partial thumb amputation     PROSTATE SURGERY     Prostate Biopsy    FAMILY HISTORY: family history includes Cancer in his mother; Diabetes in his father; Hypertension in his father, mother, sister, sister, and sister; Kidney disease in his brother and father.  SOCIAL HISTORY:  reports that he quit smoking about 18 years ago. His smoking use included cigarettes. He has never used smokeless tobacco. He reports current alcohol use of about 6.0 standard drinks of alcohol per week. He reports that he does not use drugs.  ALLERGIES: Patient has no known allergies.  MEDICATIONS:  Current Outpatient Medications  Medication Sig Dispense Refill   amLODipine (NORVASC) 10 MG tablet Take 1 tablet (10 mg total) by mouth daily. 90 tablet 4   aspirin 81 MG tablet Take 81 mg by mouth daily.     Cholecalciferol (VITAMIN D-3) 1000 UNITS CAPS Take 1,000 Units by mouth daily.     cyanocobalamin 2000 MCG tablet Take 2,000 mcg by mouth daily. One tablet daily     finasteride (PROSCAR) 5 MG tablet Take 1 tablet (5 mg total) by mouth daily. 90 tablet 3   lisinopril-hydrochlorothiazide (ZESTORETIC)  20-12.5 MG tablet Take 1 tablet by mouth daily. 90 tablet 4   metFORMIN (GLUCOPHAGE) 500 MG tablet Take 1 tablet (500 mg total) by mouth 2 (two) times daily with a meal. 180 tablet 4   Multiple Vitamin (MULTIVITAMIN) tablet Take 1 tablet by mouth daily.     Omega-3 Fatty Acids (FISH OIL) 1000 MG CAPS Take 1,000 mg by mouth daily.     rosuvastatin (CRESTOR) 20 MG tablet Take 1 tablet (20 mg  total) by mouth daily. 90 tablet 4   No current facility-administered medications for this encounter.    ECOG PERFORMANCE STATUS:  0 - Asymptomatic  REVIEW OF SYSTEMS: Patient denies any weight loss, fatigue, weakness, fever, chills or night sweats. Patient denies any loss of vision, blurred vision. Patient denies any ringing  of the ears or hearing loss. No irregular heartbeat. Patient denies heart murmur or history of fainting. Patient denies any chest pain or pain radiating to her upper extremities. Patient denies any shortness of breath, difficulty breathing at night, cough or hemoptysis. Patient denies any swelling in the lower legs. Patient denies any nausea vomiting, vomiting of blood, or coffee ground material in the vomitus. Patient denies any stomach pain. Patient states has had normal bowel movements no significant constipation or diarrhea. Patient denies any dysuria, hematuria or significant nocturia. Patient denies any problems walking, swelling in the joints or loss of balance. Patient denies any skin changes, loss of hair or loss of weight. Patient denies any excessive worrying or anxiety or significant depression. Patient denies any problems with insomnia. Patient denies excessive thirst, polyuria, polydipsia. Patient denies any swollen glands, patient denies easy bruising or easy bleeding. Patient denies any recent infections, allergies or URI. Patient "s visual fields have not changed significantly in recent time.   PHYSICAL EXAM: BP 121/75   Pulse 65   Temp (!) 96.3 F (35.7 C)   Resp 15   Ht 5\' 10"  (1.778 m)   Wt 210 lb 11.2 oz (95.6 kg)   BMI 30.23 kg/m  Well-developed well-nourished patient in NAD. HEENT reveals PERLA, EOMI, discs not visualized.  Oral cavity is clear. No oral mucosal lesions are identified. Neck is clear without evidence of cervical or supraclavicular adenopathy. Lungs are clear to A&P. Cardiac examination is essentially unremarkable with regular rate and  rhythm without murmur rub or thrill. Abdomen is benign with no organomegaly or masses noted. Motor sensory and DTR levels are equal and symmetric in the upper and lower extremities. Cranial nerves II through XII are grossly intact. Proprioception is intact. No peripheral adenopathy or edema is identified. No motor or sensory levels are noted. Crude visual fields are within normal range.  LABORATORY DATA: Pathology report reviewed    RADIOLOGY RESULTS: MRI scan reviewed compatible with above-stated findings   IMPRESSION: Stage I adenocarcinoma of the prostate with rising PSA and 67 year old male  PLAN: At this time I have recommended image guided IMRT radiation therapy.  We did discuss I-125 interstitial implant although the patient is in favor of external beam treatment.  Would plan on 46 Gray over 8 weeks using IMRT treatment planning and delivery.  Risks and benefits of treatment including skin reaction fatigue increased lower urinary tract symptoms possible diarrhea all were discussed in detail with the patient and his wife.  They both comprehend my treatment plan well.  I have asked Dr. Charlsie Quest to place fiducial markers in his prostate along with giving him a 1 shot of 97-month depot of Eligard.  Patient  comprehends her recommendations well.  I would like to take this opportunity to thank you for allowing me to participate in the care of your patient.Carmina Miller, MD

## 2023-02-03 NOTE — Progress Notes (Signed)
Eligard SubQ Injection   Due to Prostate Cancer patient is present today for a Eligard Injection.  Medication: Eligard 6 month Dose: 45 mg  Location: left arm Lot: [id number] Exp: 04/2024  Patient tolerated well, no complications were noted  Performed by: Makyra Corprew H RMA  Per Dr. Apolinar Junes patient is to continue therapy for ome time injection only . Patient was advised to continue on Vitamin D 800-1000iu and Calcium 1000-1200mg  daily while on Androgen Deprivation Therapy.  PA approval dates: No PA needed

## 2023-02-03 NOTE — Patient Instructions (Addendum)
Please start the following dietary supplements and continue them for the next 6-12 months to reduce your risk for bone loss: -Calcium 1000-1200mg  daily -Vitamin D 800-1000IU daily   Prostate Biopsy Instructions  Stop all aspirin or blood thinners (aspirin, plavix, coumadin, warfarin, motrin, ibuprofen, advil, aleve, naproxen, naprosyn) for 7 days prior to the procedure.  If you have any questions about stopping these medications, please contact your primary care physician or cardiologist.  Having a light meal prior to the procedure is recommended.  If you are diabetic or have low blood sugar please bring a small snack or glucose tablet.  A Fleets enema is needed to be purchased over the counter at a local pharmacy and used 2 hours before you scheduled appointment.  This can be purchased over the counter at any pharmacy.  Antibiotics will be administered in the clinic at the time of the procedure unless otherwise specified.    Please bring someone with you to the procedure to drive you home.  A follow up appointment has been scheduled for you to receive the results of the biopsy.  If you have any questions or concerns, please feel free to call the office at [phone] or send a Mychart message.    Thank you, Staff at [facility]

## 2023-03-05 ENCOUNTER — Encounter: Payer: Medicare HMO | Admitting: Urology

## 2023-03-05 DIAGNOSIS — Z191 Hormone sensitive malignancy status: Secondary | ICD-10-CM | POA: Diagnosis not present

## 2023-03-05 DIAGNOSIS — C61 Malignant neoplasm of prostate: Secondary | ICD-10-CM | POA: Diagnosis not present

## 2023-03-06 ENCOUNTER — Ambulatory Visit: Payer: Medicare HMO | Admitting: Urology

## 2023-03-06 VITALS — BP 107/70 | HR 68

## 2023-03-06 DIAGNOSIS — C61 Malignant neoplasm of prostate: Secondary | ICD-10-CM | POA: Diagnosis not present

## 2023-03-06 DIAGNOSIS — Z2989 Encounter for other specified prophylactic measures: Secondary | ICD-10-CM

## 2023-03-06 MED ORDER — LEVOFLOXACIN 500 MG PO TABS
500.0000 mg | ORAL_TABLET | Freq: Once | ORAL | Status: AC
  Administered 2023-03-06: 500 mg via ORAL

## 2023-03-06 MED ORDER — GENTAMICIN SULFATE 40 MG/ML IJ SOLN
80.0000 mg | Freq: Once | INTRAMUSCULAR | Status: AC
  Administered 2023-03-06: 80 mg via INTRAMUSCULAR

## 2023-03-06 NOTE — Progress Notes (Signed)
03/06/23  CC: gold seed markers  HPI: 67 y.o. male with prostate cancer who presents today for placement of fiducial seed markers in anticipation of his upcoming IMRT with Dr. Rushie Chestnut.  Prostate Gold Seed Marker Placement Procedure   Informed consent was obtained after discussing risks/benefits of the procedure.  A time out was performed to ensure correct patient identity.  Pre-Procedure: - Gentamicin given prophylactically - PO Levaquin 500 mg also given today  Procedure: -Lidocaine jelly was administered per rectum -Rectal ultrasound probe was placed without difficulty and the prostate visualized - 3 fiducial gold seed markers placed, one at right base, one at left base, one at apex of prostate gland under transrectal ultrasound guidance  Post-Procedure: - Patient tolerated the procedure well - He was counseled to seek immediate medical attention if experiences any severe pain, significant bleeding, or fevers  F/u 6 mo with PSA

## 2023-03-10 ENCOUNTER — Ambulatory Visit
Admission: RE | Admit: 2023-03-10 | Discharge: 2023-03-10 | Disposition: A | Discharge status: Home / Self Care | Payer: Medicare HMO | Source: Ambulatory Visit | Attending: Radiation Oncology | Admitting: Radiation Oncology

## 2023-03-10 DIAGNOSIS — E785 Hyperlipidemia, unspecified: Secondary | ICD-10-CM | POA: Diagnosis not present

## 2023-03-10 DIAGNOSIS — E1122 Type 2 diabetes mellitus with diabetic chronic kidney disease: Secondary | ICD-10-CM | POA: Diagnosis not present

## 2023-03-10 DIAGNOSIS — Z79899 Other long term (current) drug therapy: Secondary | ICD-10-CM | POA: Diagnosis not present

## 2023-03-10 DIAGNOSIS — Z7982 Long term (current) use of aspirin: Secondary | ICD-10-CM | POA: Diagnosis not present

## 2023-03-10 DIAGNOSIS — I129 Hypertensive chronic kidney disease with stage 1 through stage 4 chronic kidney disease, or unspecified chronic kidney disease: Secondary | ICD-10-CM | POA: Diagnosis not present

## 2023-03-10 DIAGNOSIS — R351 Nocturia: Secondary | ICD-10-CM | POA: Diagnosis not present

## 2023-03-10 DIAGNOSIS — C61 Malignant neoplasm of prostate: Secondary | ICD-10-CM | POA: Insufficient documentation

## 2023-03-10 DIAGNOSIS — Z7984 Long term (current) use of oral hypoglycemic drugs: Secondary | ICD-10-CM | POA: Diagnosis not present

## 2023-03-10 DIAGNOSIS — N183 Chronic kidney disease, stage 3 unspecified: Secondary | ICD-10-CM | POA: Insufficient documentation

## 2023-03-10 DIAGNOSIS — Z87891 Personal history of nicotine dependence: Secondary | ICD-10-CM | POA: Insufficient documentation

## 2023-03-10 DIAGNOSIS — Z191 Hormone sensitive malignancy status: Secondary | ICD-10-CM | POA: Diagnosis not present

## 2023-03-13 DIAGNOSIS — E785 Hyperlipidemia, unspecified: Secondary | ICD-10-CM | POA: Diagnosis not present

## 2023-03-13 DIAGNOSIS — N183 Chronic kidney disease, stage 3 unspecified: Secondary | ICD-10-CM | POA: Diagnosis not present

## 2023-03-13 DIAGNOSIS — Z87891 Personal history of nicotine dependence: Secondary | ICD-10-CM | POA: Diagnosis not present

## 2023-03-13 DIAGNOSIS — I129 Hypertensive chronic kidney disease with stage 1 through stage 4 chronic kidney disease, or unspecified chronic kidney disease: Secondary | ICD-10-CM | POA: Diagnosis not present

## 2023-03-13 DIAGNOSIS — Z7982 Long term (current) use of aspirin: Secondary | ICD-10-CM | POA: Diagnosis not present

## 2023-03-13 DIAGNOSIS — Z79899 Other long term (current) drug therapy: Secondary | ICD-10-CM | POA: Diagnosis not present

## 2023-03-13 DIAGNOSIS — E1122 Type 2 diabetes mellitus with diabetic chronic kidney disease: Secondary | ICD-10-CM | POA: Diagnosis not present

## 2023-03-13 DIAGNOSIS — C61 Malignant neoplasm of prostate: Secondary | ICD-10-CM | POA: Diagnosis not present

## 2023-03-13 DIAGNOSIS — R351 Nocturia: Secondary | ICD-10-CM | POA: Diagnosis not present

## 2023-03-13 DIAGNOSIS — Z191 Hormone sensitive malignancy status: Secondary | ICD-10-CM | POA: Diagnosis not present

## 2023-03-14 ENCOUNTER — Other Ambulatory Visit: Payer: Self-pay | Admitting: *Deleted

## 2023-03-14 DIAGNOSIS — C61 Malignant neoplasm of prostate: Secondary | ICD-10-CM

## 2023-03-18 ENCOUNTER — Ambulatory Visit: Admission: RE | Admit: 2023-03-18 | Payer: Medicare HMO | Source: Ambulatory Visit

## 2023-03-19 ENCOUNTER — Other Ambulatory Visit: Payer: Self-pay

## 2023-03-19 ENCOUNTER — Ambulatory Visit
Admission: RE | Admit: 2023-03-19 | Discharge: 2023-03-19 | Disposition: A | Discharge status: Home / Self Care | Payer: Medicare HMO | Source: Ambulatory Visit | Attending: Radiation Oncology | Admitting: Radiation Oncology

## 2023-03-19 DIAGNOSIS — Z79899 Other long term (current) drug therapy: Secondary | ICD-10-CM | POA: Diagnosis not present

## 2023-03-19 DIAGNOSIS — Z191 Hormone sensitive malignancy status: Secondary | ICD-10-CM | POA: Diagnosis not present

## 2023-03-19 DIAGNOSIS — N183 Chronic kidney disease, stage 3 unspecified: Secondary | ICD-10-CM | POA: Diagnosis not present

## 2023-03-19 DIAGNOSIS — Z87891 Personal history of nicotine dependence: Secondary | ICD-10-CM | POA: Diagnosis not present

## 2023-03-19 DIAGNOSIS — Z510 Encounter for antineoplastic radiation therapy: Secondary | ICD-10-CM | POA: Diagnosis not present

## 2023-03-19 DIAGNOSIS — E785 Hyperlipidemia, unspecified: Secondary | ICD-10-CM | POA: Diagnosis not present

## 2023-03-19 DIAGNOSIS — I129 Hypertensive chronic kidney disease with stage 1 through stage 4 chronic kidney disease, or unspecified chronic kidney disease: Secondary | ICD-10-CM | POA: Diagnosis not present

## 2023-03-19 DIAGNOSIS — R351 Nocturia: Secondary | ICD-10-CM | POA: Diagnosis not present

## 2023-03-19 DIAGNOSIS — C61 Malignant neoplasm of prostate: Secondary | ICD-10-CM | POA: Diagnosis not present

## 2023-03-19 DIAGNOSIS — Z7982 Long term (current) use of aspirin: Secondary | ICD-10-CM | POA: Diagnosis not present

## 2023-03-19 DIAGNOSIS — E1122 Type 2 diabetes mellitus with diabetic chronic kidney disease: Secondary | ICD-10-CM | POA: Diagnosis not present

## 2023-03-19 LAB — RAD ONC ARIA SESSION SUMMARY
Course Elapsed Days: 0
Plan Fractions Treated to Date: 1
Plan Prescribed Dose Per Fraction: 2 Gy
Plan Total Fractions Prescribed: 40
Plan Total Prescribed Dose: 80 Gy
Reference Point Dosage Given to Date: 2 Gy
Reference Point Session Dosage Given: 2 Gy
Session Number: 1

## 2023-03-20 ENCOUNTER — Other Ambulatory Visit: Payer: Self-pay

## 2023-03-20 ENCOUNTER — Ambulatory Visit
Admission: RE | Admit: 2023-03-20 | Discharge: 2023-03-20 | Disposition: A | Discharge status: Home / Self Care | Payer: Medicare HMO | Source: Ambulatory Visit | Attending: Radiation Oncology | Admitting: Radiation Oncology

## 2023-03-20 DIAGNOSIS — E785 Hyperlipidemia, unspecified: Secondary | ICD-10-CM | POA: Diagnosis not present

## 2023-03-20 DIAGNOSIS — E1122 Type 2 diabetes mellitus with diabetic chronic kidney disease: Secondary | ICD-10-CM | POA: Diagnosis not present

## 2023-03-20 DIAGNOSIS — Z510 Encounter for antineoplastic radiation therapy: Secondary | ICD-10-CM | POA: Diagnosis not present

## 2023-03-20 DIAGNOSIS — Z7982 Long term (current) use of aspirin: Secondary | ICD-10-CM | POA: Diagnosis not present

## 2023-03-20 DIAGNOSIS — R351 Nocturia: Secondary | ICD-10-CM | POA: Diagnosis not present

## 2023-03-20 DIAGNOSIS — Z87891 Personal history of nicotine dependence: Secondary | ICD-10-CM | POA: Diagnosis not present

## 2023-03-20 DIAGNOSIS — N183 Chronic kidney disease, stage 3 unspecified: Secondary | ICD-10-CM | POA: Diagnosis not present

## 2023-03-20 DIAGNOSIS — Z79899 Other long term (current) drug therapy: Secondary | ICD-10-CM | POA: Diagnosis not present

## 2023-03-20 DIAGNOSIS — C61 Malignant neoplasm of prostate: Secondary | ICD-10-CM | POA: Diagnosis not present

## 2023-03-20 DIAGNOSIS — Z191 Hormone sensitive malignancy status: Secondary | ICD-10-CM | POA: Diagnosis not present

## 2023-03-20 DIAGNOSIS — I129 Hypertensive chronic kidney disease with stage 1 through stage 4 chronic kidney disease, or unspecified chronic kidney disease: Secondary | ICD-10-CM | POA: Diagnosis not present

## 2023-03-20 LAB — RAD ONC ARIA SESSION SUMMARY
Course Elapsed Days: 1
Plan Fractions Treated to Date: 2
Plan Prescribed Dose Per Fraction: 2 Gy
Plan Total Fractions Prescribed: 40
Plan Total Prescribed Dose: 80 Gy
Reference Point Dosage Given to Date: 4 Gy
Reference Point Session Dosage Given: 2 Gy
Session Number: 2

## 2023-03-21 ENCOUNTER — Other Ambulatory Visit: Payer: Self-pay

## 2023-03-21 ENCOUNTER — Ambulatory Visit
Admission: RE | Admit: 2023-03-21 | Discharge: 2023-03-21 | Disposition: A | Discharge status: Home / Self Care | Payer: Medicare HMO | Source: Ambulatory Visit | Attending: Radiation Oncology | Admitting: Radiation Oncology

## 2023-03-21 DIAGNOSIS — Z87891 Personal history of nicotine dependence: Secondary | ICD-10-CM | POA: Diagnosis not present

## 2023-03-21 DIAGNOSIS — Z191 Hormone sensitive malignancy status: Secondary | ICD-10-CM | POA: Diagnosis not present

## 2023-03-21 DIAGNOSIS — I129 Hypertensive chronic kidney disease with stage 1 through stage 4 chronic kidney disease, or unspecified chronic kidney disease: Secondary | ICD-10-CM | POA: Diagnosis not present

## 2023-03-21 DIAGNOSIS — Z79899 Other long term (current) drug therapy: Secondary | ICD-10-CM | POA: Diagnosis not present

## 2023-03-21 DIAGNOSIS — E785 Hyperlipidemia, unspecified: Secondary | ICD-10-CM | POA: Diagnosis not present

## 2023-03-21 DIAGNOSIS — R351 Nocturia: Secondary | ICD-10-CM | POA: Diagnosis not present

## 2023-03-21 DIAGNOSIS — Z510 Encounter for antineoplastic radiation therapy: Secondary | ICD-10-CM | POA: Diagnosis not present

## 2023-03-21 DIAGNOSIS — Z7982 Long term (current) use of aspirin: Secondary | ICD-10-CM | POA: Diagnosis not present

## 2023-03-21 DIAGNOSIS — C61 Malignant neoplasm of prostate: Secondary | ICD-10-CM | POA: Diagnosis not present

## 2023-03-21 DIAGNOSIS — E1122 Type 2 diabetes mellitus with diabetic chronic kidney disease: Secondary | ICD-10-CM | POA: Diagnosis not present

## 2023-03-21 DIAGNOSIS — N183 Chronic kidney disease, stage 3 unspecified: Secondary | ICD-10-CM | POA: Diagnosis not present

## 2023-03-21 LAB — RAD ONC ARIA SESSION SUMMARY
Course Elapsed Days: 2
Plan Fractions Treated to Date: 1
Plan Prescribed Dose Per Fraction: 2.538 Gy
Plan Total Fractions Prescribed: 26
Plan Total Prescribed Dose: 65.988 Gy
Reference Point Dosage Given to Date: 6.538 Gy
Reference Point Session Dosage Given: 2.538 Gy
Session Number: 3

## 2023-03-24 ENCOUNTER — Inpatient Hospital Stay: Payer: Medicare HMO

## 2023-03-24 ENCOUNTER — Other Ambulatory Visit: Payer: Self-pay

## 2023-03-24 ENCOUNTER — Ambulatory Visit
Admission: RE | Admit: 2023-03-24 | Discharge: 2023-03-24 | Disposition: A | Discharge status: Home / Self Care | Payer: Medicare HMO | Source: Ambulatory Visit | Attending: Radiation Oncology | Admitting: Radiation Oncology

## 2023-03-24 DIAGNOSIS — C61 Malignant neoplasm of prostate: Secondary | ICD-10-CM | POA: Insufficient documentation

## 2023-03-24 DIAGNOSIS — Z191 Hormone sensitive malignancy status: Secondary | ICD-10-CM | POA: Diagnosis not present

## 2023-03-24 DIAGNOSIS — Z79899 Other long term (current) drug therapy: Secondary | ICD-10-CM | POA: Diagnosis not present

## 2023-03-24 DIAGNOSIS — Z87891 Personal history of nicotine dependence: Secondary | ICD-10-CM | POA: Diagnosis not present

## 2023-03-24 DIAGNOSIS — Z7982 Long term (current) use of aspirin: Secondary | ICD-10-CM | POA: Diagnosis not present

## 2023-03-24 DIAGNOSIS — E785 Hyperlipidemia, unspecified: Secondary | ICD-10-CM | POA: Diagnosis not present

## 2023-03-24 DIAGNOSIS — E1122 Type 2 diabetes mellitus with diabetic chronic kidney disease: Secondary | ICD-10-CM | POA: Diagnosis not present

## 2023-03-24 DIAGNOSIS — I129 Hypertensive chronic kidney disease with stage 1 through stage 4 chronic kidney disease, or unspecified chronic kidney disease: Secondary | ICD-10-CM | POA: Diagnosis not present

## 2023-03-24 DIAGNOSIS — Z510 Encounter for antineoplastic radiation therapy: Secondary | ICD-10-CM | POA: Diagnosis not present

## 2023-03-24 DIAGNOSIS — R351 Nocturia: Secondary | ICD-10-CM | POA: Diagnosis not present

## 2023-03-24 DIAGNOSIS — N183 Chronic kidney disease, stage 3 unspecified: Secondary | ICD-10-CM | POA: Diagnosis not present

## 2023-03-24 LAB — RAD ONC ARIA SESSION SUMMARY
Course Elapsed Days: 5
Plan Fractions Treated to Date: 2
Plan Prescribed Dose Per Fraction: 2.538 Gy
Plan Total Fractions Prescribed: 26
Plan Total Prescribed Dose: 65.988 Gy
Reference Point Dosage Given to Date: 9.076 Gy
Reference Point Session Dosage Given: 2.538 Gy
Session Number: 4

## 2023-03-24 LAB — CBC (CANCER CENTER ONLY)
HCT: 37.6 % — ABNORMAL LOW (ref 39.0–52.0)
Hemoglobin: 12.6 g/dL — ABNORMAL LOW (ref 13.0–17.0)
MCH: 27.2 pg (ref 26.0–34.0)
MCHC: 33.5 g/dL (ref 30.0–36.0)
MCV: 81.2 fL (ref 80.0–100.0)
Platelet Count: 252 10*3/uL (ref 150–400)
RBC: 4.63 MIL/uL (ref 4.22–5.81)
RDW: 12.9 % (ref 11.5–15.5)
WBC Count: 5.1 10*3/uL (ref 4.0–10.5)
nRBC: 0 % (ref 0.0–0.2)

## 2023-03-25 ENCOUNTER — Other Ambulatory Visit: Payer: Self-pay

## 2023-03-25 ENCOUNTER — Ambulatory Visit
Admission: RE | Admit: 2023-03-25 | Discharge: 2023-03-25 | Disposition: A | Discharge status: Home / Self Care | Payer: Medicare HMO | Source: Ambulatory Visit | Attending: Radiation Oncology | Admitting: Radiation Oncology

## 2023-03-25 DIAGNOSIS — I129 Hypertensive chronic kidney disease with stage 1 through stage 4 chronic kidney disease, or unspecified chronic kidney disease: Secondary | ICD-10-CM | POA: Diagnosis not present

## 2023-03-25 DIAGNOSIS — Z191 Hormone sensitive malignancy status: Secondary | ICD-10-CM | POA: Diagnosis not present

## 2023-03-25 DIAGNOSIS — N183 Chronic kidney disease, stage 3 unspecified: Secondary | ICD-10-CM | POA: Diagnosis not present

## 2023-03-25 DIAGNOSIS — R351 Nocturia: Secondary | ICD-10-CM | POA: Diagnosis not present

## 2023-03-25 DIAGNOSIS — E785 Hyperlipidemia, unspecified: Secondary | ICD-10-CM | POA: Diagnosis not present

## 2023-03-25 DIAGNOSIS — E1122 Type 2 diabetes mellitus with diabetic chronic kidney disease: Secondary | ICD-10-CM | POA: Diagnosis not present

## 2023-03-25 DIAGNOSIS — Z510 Encounter for antineoplastic radiation therapy: Secondary | ICD-10-CM | POA: Diagnosis not present

## 2023-03-25 DIAGNOSIS — Z87891 Personal history of nicotine dependence: Secondary | ICD-10-CM | POA: Diagnosis not present

## 2023-03-25 DIAGNOSIS — Z7982 Long term (current) use of aspirin: Secondary | ICD-10-CM | POA: Diagnosis not present

## 2023-03-25 DIAGNOSIS — C61 Malignant neoplasm of prostate: Secondary | ICD-10-CM | POA: Diagnosis not present

## 2023-03-25 DIAGNOSIS — Z79899 Other long term (current) drug therapy: Secondary | ICD-10-CM | POA: Diagnosis not present

## 2023-03-25 LAB — RAD ONC ARIA SESSION SUMMARY
Course Elapsed Days: 6
Plan Fractions Treated to Date: 3
Plan Prescribed Dose Per Fraction: 2.538 Gy
Plan Total Fractions Prescribed: 26
Plan Total Prescribed Dose: 65.988 Gy
Reference Point Dosage Given to Date: 11.614 Gy
Reference Point Session Dosage Given: 2.538 Gy
Session Number: 5

## 2023-03-26 ENCOUNTER — Other Ambulatory Visit: Payer: Self-pay

## 2023-03-26 ENCOUNTER — Ambulatory Visit
Admission: RE | Admit: 2023-03-26 | Discharge: 2023-03-26 | Disposition: A | Discharge status: Home / Self Care | Payer: Medicare HMO | Source: Ambulatory Visit | Attending: Radiation Oncology | Admitting: Radiation Oncology

## 2023-03-26 DIAGNOSIS — N183 Chronic kidney disease, stage 3 unspecified: Secondary | ICD-10-CM | POA: Diagnosis not present

## 2023-03-26 DIAGNOSIS — E1122 Type 2 diabetes mellitus with diabetic chronic kidney disease: Secondary | ICD-10-CM | POA: Diagnosis not present

## 2023-03-26 DIAGNOSIS — Z79899 Other long term (current) drug therapy: Secondary | ICD-10-CM | POA: Diagnosis not present

## 2023-03-26 DIAGNOSIS — Z510 Encounter for antineoplastic radiation therapy: Secondary | ICD-10-CM | POA: Diagnosis not present

## 2023-03-26 DIAGNOSIS — C61 Malignant neoplasm of prostate: Secondary | ICD-10-CM | POA: Diagnosis not present

## 2023-03-26 DIAGNOSIS — R351 Nocturia: Secondary | ICD-10-CM | POA: Diagnosis not present

## 2023-03-26 DIAGNOSIS — I129 Hypertensive chronic kidney disease with stage 1 through stage 4 chronic kidney disease, or unspecified chronic kidney disease: Secondary | ICD-10-CM | POA: Diagnosis not present

## 2023-03-26 DIAGNOSIS — Z7982 Long term (current) use of aspirin: Secondary | ICD-10-CM | POA: Diagnosis not present

## 2023-03-26 DIAGNOSIS — E785 Hyperlipidemia, unspecified: Secondary | ICD-10-CM | POA: Diagnosis not present

## 2023-03-26 DIAGNOSIS — Z191 Hormone sensitive malignancy status: Secondary | ICD-10-CM | POA: Diagnosis not present

## 2023-03-26 DIAGNOSIS — Z87891 Personal history of nicotine dependence: Secondary | ICD-10-CM | POA: Diagnosis not present

## 2023-03-26 LAB — RAD ONC ARIA SESSION SUMMARY
Course Elapsed Days: 7
Plan Fractions Treated to Date: 4
Plan Prescribed Dose Per Fraction: 2.538 Gy
Plan Total Fractions Prescribed: 26
Plan Total Prescribed Dose: 65.988 Gy
Reference Point Dosage Given to Date: 14.152 Gy
Reference Point Session Dosage Given: 2.538 Gy
Session Number: 6

## 2023-03-27 ENCOUNTER — Ambulatory Visit
Admission: RE | Admit: 2023-03-27 | Discharge: 2023-03-27 | Disposition: A | Discharge status: Home / Self Care | Payer: Medicare HMO | Source: Ambulatory Visit | Attending: Radiation Oncology | Admitting: Radiation Oncology

## 2023-03-27 ENCOUNTER — Other Ambulatory Visit: Payer: Self-pay

## 2023-03-27 DIAGNOSIS — C61 Malignant neoplasm of prostate: Secondary | ICD-10-CM | POA: Insufficient documentation

## 2023-03-27 DIAGNOSIS — R351 Nocturia: Secondary | ICD-10-CM | POA: Diagnosis not present

## 2023-03-27 DIAGNOSIS — I129 Hypertensive chronic kidney disease with stage 1 through stage 4 chronic kidney disease, or unspecified chronic kidney disease: Secondary | ICD-10-CM | POA: Diagnosis not present

## 2023-03-27 DIAGNOSIS — Z79899 Other long term (current) drug therapy: Secondary | ICD-10-CM | POA: Insufficient documentation

## 2023-03-27 DIAGNOSIS — E1122 Type 2 diabetes mellitus with diabetic chronic kidney disease: Secondary | ICD-10-CM | POA: Insufficient documentation

## 2023-03-27 DIAGNOSIS — Z7982 Long term (current) use of aspirin: Secondary | ICD-10-CM | POA: Diagnosis not present

## 2023-03-27 DIAGNOSIS — Z87891 Personal history of nicotine dependence: Secondary | ICD-10-CM | POA: Insufficient documentation

## 2023-03-27 DIAGNOSIS — Z510 Encounter for antineoplastic radiation therapy: Secondary | ICD-10-CM | POA: Diagnosis not present

## 2023-03-27 DIAGNOSIS — N183 Chronic kidney disease, stage 3 unspecified: Secondary | ICD-10-CM | POA: Insufficient documentation

## 2023-03-27 DIAGNOSIS — Z191 Hormone sensitive malignancy status: Secondary | ICD-10-CM | POA: Diagnosis not present

## 2023-03-27 DIAGNOSIS — E785 Hyperlipidemia, unspecified: Secondary | ICD-10-CM | POA: Insufficient documentation

## 2023-03-27 DIAGNOSIS — Z7984 Long term (current) use of oral hypoglycemic drugs: Secondary | ICD-10-CM | POA: Insufficient documentation

## 2023-03-27 LAB — RAD ONC ARIA SESSION SUMMARY
Course Elapsed Days: 8
Plan Fractions Treated to Date: 5
Plan Prescribed Dose Per Fraction: 2.538 Gy
Plan Total Fractions Prescribed: 26
Plan Total Prescribed Dose: 65.988 Gy
Reference Point Dosage Given to Date: 16.69 Gy
Reference Point Session Dosage Given: 2.538 Gy
Session Number: 7

## 2023-03-28 ENCOUNTER — Other Ambulatory Visit: Payer: Self-pay

## 2023-03-28 ENCOUNTER — Ambulatory Visit
Admission: RE | Admit: 2023-03-28 | Discharge: 2023-03-28 | Disposition: A | Discharge status: Home / Self Care | Payer: Medicare HMO | Source: Ambulatory Visit | Attending: Radiation Oncology | Admitting: Radiation Oncology

## 2023-03-28 DIAGNOSIS — Z7982 Long term (current) use of aspirin: Secondary | ICD-10-CM | POA: Diagnosis not present

## 2023-03-28 DIAGNOSIS — I129 Hypertensive chronic kidney disease with stage 1 through stage 4 chronic kidney disease, or unspecified chronic kidney disease: Secondary | ICD-10-CM | POA: Diagnosis not present

## 2023-03-28 DIAGNOSIS — C61 Malignant neoplasm of prostate: Secondary | ICD-10-CM | POA: Diagnosis not present

## 2023-03-28 DIAGNOSIS — Z191 Hormone sensitive malignancy status: Secondary | ICD-10-CM | POA: Diagnosis not present

## 2023-03-28 DIAGNOSIS — N183 Chronic kidney disease, stage 3 unspecified: Secondary | ICD-10-CM | POA: Diagnosis not present

## 2023-03-28 DIAGNOSIS — Z510 Encounter for antineoplastic radiation therapy: Secondary | ICD-10-CM | POA: Diagnosis not present

## 2023-03-28 DIAGNOSIS — Z87891 Personal history of nicotine dependence: Secondary | ICD-10-CM | POA: Diagnosis not present

## 2023-03-28 DIAGNOSIS — R351 Nocturia: Secondary | ICD-10-CM | POA: Diagnosis not present

## 2023-03-28 DIAGNOSIS — E1122 Type 2 diabetes mellitus with diabetic chronic kidney disease: Secondary | ICD-10-CM | POA: Diagnosis not present

## 2023-03-28 DIAGNOSIS — E785 Hyperlipidemia, unspecified: Secondary | ICD-10-CM | POA: Diagnosis not present

## 2023-03-28 DIAGNOSIS — Z79899 Other long term (current) drug therapy: Secondary | ICD-10-CM | POA: Diagnosis not present

## 2023-03-28 LAB — RAD ONC ARIA SESSION SUMMARY
Course Elapsed Days: 9
Plan Fractions Treated to Date: 6
Plan Prescribed Dose Per Fraction: 2.538 Gy
Plan Total Fractions Prescribed: 26
Plan Total Prescribed Dose: 65.988 Gy
Reference Point Dosage Given to Date: 19.228 Gy
Reference Point Session Dosage Given: 2.538 Gy
Session Number: 8

## 2023-03-31 ENCOUNTER — Other Ambulatory Visit: Payer: Self-pay

## 2023-03-31 ENCOUNTER — Ambulatory Visit
Admission: RE | Admit: 2023-03-31 | Discharge: 2023-03-31 | Disposition: A | Discharge status: Home / Self Care | Payer: Medicare HMO | Source: Ambulatory Visit | Attending: Radiation Oncology | Admitting: Radiation Oncology

## 2023-03-31 DIAGNOSIS — E1122 Type 2 diabetes mellitus with diabetic chronic kidney disease: Secondary | ICD-10-CM | POA: Diagnosis not present

## 2023-03-31 DIAGNOSIS — Z87891 Personal history of nicotine dependence: Secondary | ICD-10-CM | POA: Diagnosis not present

## 2023-03-31 DIAGNOSIS — R351 Nocturia: Secondary | ICD-10-CM | POA: Diagnosis not present

## 2023-03-31 DIAGNOSIS — C61 Malignant neoplasm of prostate: Secondary | ICD-10-CM | POA: Diagnosis not present

## 2023-03-31 DIAGNOSIS — E785 Hyperlipidemia, unspecified: Secondary | ICD-10-CM | POA: Diagnosis not present

## 2023-03-31 DIAGNOSIS — Z191 Hormone sensitive malignancy status: Secondary | ICD-10-CM | POA: Diagnosis not present

## 2023-03-31 DIAGNOSIS — Z79899 Other long term (current) drug therapy: Secondary | ICD-10-CM | POA: Diagnosis not present

## 2023-03-31 DIAGNOSIS — Z7982 Long term (current) use of aspirin: Secondary | ICD-10-CM | POA: Diagnosis not present

## 2023-03-31 DIAGNOSIS — I129 Hypertensive chronic kidney disease with stage 1 through stage 4 chronic kidney disease, or unspecified chronic kidney disease: Secondary | ICD-10-CM | POA: Diagnosis not present

## 2023-03-31 DIAGNOSIS — Z510 Encounter for antineoplastic radiation therapy: Secondary | ICD-10-CM | POA: Diagnosis not present

## 2023-03-31 DIAGNOSIS — N183 Chronic kidney disease, stage 3 unspecified: Secondary | ICD-10-CM | POA: Diagnosis not present

## 2023-03-31 LAB — RAD ONC ARIA SESSION SUMMARY
Course Elapsed Days: 12
Plan Fractions Treated to Date: 7
Plan Prescribed Dose Per Fraction: 2.538 Gy
Plan Total Fractions Prescribed: 26
Plan Total Prescribed Dose: 65.988 Gy
Reference Point Dosage Given to Date: 21.766 Gy
Reference Point Session Dosage Given: 2.538 Gy
Session Number: 9

## 2023-04-01 ENCOUNTER — Other Ambulatory Visit: Payer: Self-pay

## 2023-04-01 ENCOUNTER — Ambulatory Visit
Admission: RE | Admit: 2023-04-01 | Discharge: 2023-04-01 | Disposition: A | Discharge status: Home / Self Care | Payer: Medicare HMO | Source: Ambulatory Visit | Attending: Radiation Oncology | Admitting: Radiation Oncology

## 2023-04-01 DIAGNOSIS — C61 Malignant neoplasm of prostate: Secondary | ICD-10-CM | POA: Diagnosis not present

## 2023-04-01 DIAGNOSIS — Z7982 Long term (current) use of aspirin: Secondary | ICD-10-CM | POA: Diagnosis not present

## 2023-04-01 DIAGNOSIS — Z87891 Personal history of nicotine dependence: Secondary | ICD-10-CM | POA: Diagnosis not present

## 2023-04-01 DIAGNOSIS — Z510 Encounter for antineoplastic radiation therapy: Secondary | ICD-10-CM | POA: Diagnosis not present

## 2023-04-01 DIAGNOSIS — N183 Chronic kidney disease, stage 3 unspecified: Secondary | ICD-10-CM | POA: Diagnosis not present

## 2023-04-01 DIAGNOSIS — E785 Hyperlipidemia, unspecified: Secondary | ICD-10-CM | POA: Diagnosis not present

## 2023-04-01 DIAGNOSIS — Z79899 Other long term (current) drug therapy: Secondary | ICD-10-CM | POA: Diagnosis not present

## 2023-04-01 DIAGNOSIS — I129 Hypertensive chronic kidney disease with stage 1 through stage 4 chronic kidney disease, or unspecified chronic kidney disease: Secondary | ICD-10-CM | POA: Diagnosis not present

## 2023-04-01 DIAGNOSIS — Z191 Hormone sensitive malignancy status: Secondary | ICD-10-CM | POA: Diagnosis not present

## 2023-04-01 DIAGNOSIS — E1122 Type 2 diabetes mellitus with diabetic chronic kidney disease: Secondary | ICD-10-CM | POA: Diagnosis not present

## 2023-04-01 DIAGNOSIS — R351 Nocturia: Secondary | ICD-10-CM | POA: Diagnosis not present

## 2023-04-01 LAB — RAD ONC ARIA SESSION SUMMARY
Course Elapsed Days: 13
Plan Fractions Treated to Date: 8
Plan Prescribed Dose Per Fraction: 2.538 Gy
Plan Total Fractions Prescribed: 26
Plan Total Prescribed Dose: 65.988 Gy
Reference Point Dosage Given to Date: 24.304 Gy
Reference Point Session Dosage Given: 2.538 Gy
Session Number: 10

## 2023-04-02 ENCOUNTER — Other Ambulatory Visit: Payer: Self-pay

## 2023-04-02 ENCOUNTER — Ambulatory Visit
Admission: RE | Admit: 2023-04-02 | Discharge: 2023-04-02 | Disposition: A | Discharge status: Home / Self Care | Payer: Medicare HMO | Source: Ambulatory Visit | Attending: Radiation Oncology | Admitting: Radiation Oncology

## 2023-04-02 DIAGNOSIS — E1122 Type 2 diabetes mellitus with diabetic chronic kidney disease: Secondary | ICD-10-CM | POA: Diagnosis not present

## 2023-04-02 DIAGNOSIS — Z191 Hormone sensitive malignancy status: Secondary | ICD-10-CM | POA: Diagnosis not present

## 2023-04-02 DIAGNOSIS — R351 Nocturia: Secondary | ICD-10-CM | POA: Diagnosis not present

## 2023-04-02 DIAGNOSIS — N183 Chronic kidney disease, stage 3 unspecified: Secondary | ICD-10-CM | POA: Diagnosis not present

## 2023-04-02 DIAGNOSIS — E785 Hyperlipidemia, unspecified: Secondary | ICD-10-CM | POA: Diagnosis not present

## 2023-04-02 DIAGNOSIS — C61 Malignant neoplasm of prostate: Secondary | ICD-10-CM | POA: Diagnosis not present

## 2023-04-02 DIAGNOSIS — Z510 Encounter for antineoplastic radiation therapy: Secondary | ICD-10-CM | POA: Diagnosis not present

## 2023-04-02 DIAGNOSIS — I129 Hypertensive chronic kidney disease with stage 1 through stage 4 chronic kidney disease, or unspecified chronic kidney disease: Secondary | ICD-10-CM | POA: Diagnosis not present

## 2023-04-02 DIAGNOSIS — Z87891 Personal history of nicotine dependence: Secondary | ICD-10-CM | POA: Diagnosis not present

## 2023-04-02 DIAGNOSIS — Z7982 Long term (current) use of aspirin: Secondary | ICD-10-CM | POA: Diagnosis not present

## 2023-04-02 DIAGNOSIS — Z79899 Other long term (current) drug therapy: Secondary | ICD-10-CM | POA: Diagnosis not present

## 2023-04-02 LAB — RAD ONC ARIA SESSION SUMMARY
Course Elapsed Days: 14
Plan Fractions Treated to Date: 9
Plan Prescribed Dose Per Fraction: 2.538 Gy
Plan Total Fractions Prescribed: 26
Plan Total Prescribed Dose: 65.988 Gy
Reference Point Dosage Given to Date: 26.842 Gy
Reference Point Session Dosage Given: 2.538 Gy
Session Number: 11

## 2023-04-03 ENCOUNTER — Ambulatory Visit
Admission: RE | Admit: 2023-04-03 | Discharge: 2023-04-03 | Disposition: A | Discharge status: Home / Self Care | Payer: Medicare HMO | Source: Ambulatory Visit | Attending: Radiation Oncology | Admitting: Radiation Oncology

## 2023-04-03 ENCOUNTER — Other Ambulatory Visit: Payer: Self-pay

## 2023-04-03 DIAGNOSIS — Z7982 Long term (current) use of aspirin: Secondary | ICD-10-CM | POA: Diagnosis not present

## 2023-04-03 DIAGNOSIS — I129 Hypertensive chronic kidney disease with stage 1 through stage 4 chronic kidney disease, or unspecified chronic kidney disease: Secondary | ICD-10-CM | POA: Diagnosis not present

## 2023-04-03 DIAGNOSIS — N183 Chronic kidney disease, stage 3 unspecified: Secondary | ICD-10-CM | POA: Diagnosis not present

## 2023-04-03 DIAGNOSIS — C61 Malignant neoplasm of prostate: Secondary | ICD-10-CM | POA: Diagnosis not present

## 2023-04-03 DIAGNOSIS — Z79899 Other long term (current) drug therapy: Secondary | ICD-10-CM | POA: Diagnosis not present

## 2023-04-03 DIAGNOSIS — Z191 Hormone sensitive malignancy status: Secondary | ICD-10-CM | POA: Diagnosis not present

## 2023-04-03 DIAGNOSIS — Z87891 Personal history of nicotine dependence: Secondary | ICD-10-CM | POA: Diagnosis not present

## 2023-04-03 DIAGNOSIS — R351 Nocturia: Secondary | ICD-10-CM | POA: Diagnosis not present

## 2023-04-03 DIAGNOSIS — Z510 Encounter for antineoplastic radiation therapy: Secondary | ICD-10-CM | POA: Diagnosis not present

## 2023-04-03 DIAGNOSIS — E785 Hyperlipidemia, unspecified: Secondary | ICD-10-CM | POA: Diagnosis not present

## 2023-04-03 DIAGNOSIS — E1122 Type 2 diabetes mellitus with diabetic chronic kidney disease: Secondary | ICD-10-CM | POA: Diagnosis not present

## 2023-04-03 LAB — RAD ONC ARIA SESSION SUMMARY
Course Elapsed Days: 15
Plan Fractions Treated to Date: 10
Plan Prescribed Dose Per Fraction: 2.538 Gy
Plan Total Fractions Prescribed: 26
Plan Total Prescribed Dose: 65.988 Gy
Reference Point Dosage Given to Date: 29.38 Gy
Reference Point Session Dosage Given: 2.538 Gy
Session Number: 12

## 2023-04-04 ENCOUNTER — Ambulatory Visit
Admission: RE | Admit: 2023-04-04 | Discharge: 2023-04-04 | Disposition: A | Discharge status: Home / Self Care | Payer: Medicare HMO | Source: Ambulatory Visit | Attending: Radiation Oncology | Admitting: Radiation Oncology

## 2023-04-04 ENCOUNTER — Other Ambulatory Visit: Payer: Self-pay

## 2023-04-04 DIAGNOSIS — C61 Malignant neoplasm of prostate: Secondary | ICD-10-CM | POA: Diagnosis not present

## 2023-04-04 DIAGNOSIS — Z7982 Long term (current) use of aspirin: Secondary | ICD-10-CM | POA: Diagnosis not present

## 2023-04-04 DIAGNOSIS — Z191 Hormone sensitive malignancy status: Secondary | ICD-10-CM | POA: Diagnosis not present

## 2023-04-04 DIAGNOSIS — Z510 Encounter for antineoplastic radiation therapy: Secondary | ICD-10-CM | POA: Diagnosis not present

## 2023-04-04 DIAGNOSIS — E1122 Type 2 diabetes mellitus with diabetic chronic kidney disease: Secondary | ICD-10-CM | POA: Diagnosis not present

## 2023-04-04 DIAGNOSIS — Z87891 Personal history of nicotine dependence: Secondary | ICD-10-CM | POA: Diagnosis not present

## 2023-04-04 DIAGNOSIS — E785 Hyperlipidemia, unspecified: Secondary | ICD-10-CM | POA: Diagnosis not present

## 2023-04-04 DIAGNOSIS — I129 Hypertensive chronic kidney disease with stage 1 through stage 4 chronic kidney disease, or unspecified chronic kidney disease: Secondary | ICD-10-CM | POA: Diagnosis not present

## 2023-04-04 DIAGNOSIS — R351 Nocturia: Secondary | ICD-10-CM | POA: Diagnosis not present

## 2023-04-04 DIAGNOSIS — N183 Chronic kidney disease, stage 3 unspecified: Secondary | ICD-10-CM | POA: Diagnosis not present

## 2023-04-04 DIAGNOSIS — Z79899 Other long term (current) drug therapy: Secondary | ICD-10-CM | POA: Diagnosis not present

## 2023-04-04 LAB — RAD ONC ARIA SESSION SUMMARY
Course Elapsed Days: 16
Plan Fractions Treated to Date: 11
Plan Prescribed Dose Per Fraction: 2.538 Gy
Plan Total Fractions Prescribed: 26
Plan Total Prescribed Dose: 65.988 Gy
Reference Point Dosage Given to Date: 31.918 Gy
Reference Point Session Dosage Given: 2.538 Gy
Session Number: 13

## 2023-04-07 ENCOUNTER — Inpatient Hospital Stay: Payer: Medicare HMO

## 2023-04-07 ENCOUNTER — Other Ambulatory Visit: Payer: Self-pay

## 2023-04-07 ENCOUNTER — Ambulatory Visit
Admission: RE | Admit: 2023-04-07 | Discharge: 2023-04-07 | Disposition: A | Discharge status: Home / Self Care | Payer: Medicare HMO | Source: Ambulatory Visit | Attending: Radiation Oncology | Admitting: Radiation Oncology

## 2023-04-07 DIAGNOSIS — C61 Malignant neoplasm of prostate: Secondary | ICD-10-CM | POA: Insufficient documentation

## 2023-04-07 DIAGNOSIS — Z87891 Personal history of nicotine dependence: Secondary | ICD-10-CM | POA: Diagnosis not present

## 2023-04-07 DIAGNOSIS — N183 Chronic kidney disease, stage 3 unspecified: Secondary | ICD-10-CM | POA: Diagnosis not present

## 2023-04-07 DIAGNOSIS — E785 Hyperlipidemia, unspecified: Secondary | ICD-10-CM | POA: Diagnosis not present

## 2023-04-07 DIAGNOSIS — Z510 Encounter for antineoplastic radiation therapy: Secondary | ICD-10-CM | POA: Diagnosis not present

## 2023-04-07 DIAGNOSIS — R351 Nocturia: Secondary | ICD-10-CM | POA: Diagnosis not present

## 2023-04-07 DIAGNOSIS — Z191 Hormone sensitive malignancy status: Secondary | ICD-10-CM | POA: Diagnosis not present

## 2023-04-07 DIAGNOSIS — I129 Hypertensive chronic kidney disease with stage 1 through stage 4 chronic kidney disease, or unspecified chronic kidney disease: Secondary | ICD-10-CM | POA: Diagnosis not present

## 2023-04-07 DIAGNOSIS — Z79899 Other long term (current) drug therapy: Secondary | ICD-10-CM | POA: Diagnosis not present

## 2023-04-07 DIAGNOSIS — Z7982 Long term (current) use of aspirin: Secondary | ICD-10-CM | POA: Diagnosis not present

## 2023-04-07 DIAGNOSIS — E1122 Type 2 diabetes mellitus with diabetic chronic kidney disease: Secondary | ICD-10-CM | POA: Diagnosis not present

## 2023-04-07 LAB — RAD ONC ARIA SESSION SUMMARY
Course Elapsed Days: 19
Plan Fractions Treated to Date: 12
Plan Prescribed Dose Per Fraction: 2.538 Gy
Plan Total Fractions Prescribed: 26
Plan Total Prescribed Dose: 65.988 Gy
Reference Point Dosage Given to Date: 34.456 Gy
Reference Point Session Dosage Given: 2.538 Gy
Session Number: 14

## 2023-04-07 LAB — CBC (CANCER CENTER ONLY)
HCT: 37.7 % — ABNORMAL LOW (ref 39.0–52.0)
Hemoglobin: 12.6 g/dL — ABNORMAL LOW (ref 13.0–17.0)
MCH: 27.1 pg (ref 26.0–34.0)
MCHC: 33.4 g/dL (ref 30.0–36.0)
MCV: 81.1 fL (ref 80.0–100.0)
Platelet Count: 245 10*3/uL (ref 150–400)
RBC: 4.65 MIL/uL (ref 4.22–5.81)
RDW: 12.7 % (ref 11.5–15.5)
WBC Count: 3.9 10*3/uL — ABNORMAL LOW (ref 4.0–10.5)
nRBC: 0 % (ref 0.0–0.2)

## 2023-04-08 ENCOUNTER — Ambulatory Visit
Admission: RE | Admit: 2023-04-08 | Discharge: 2023-04-08 | Disposition: A | Discharge status: Home / Self Care | Payer: Medicare HMO | Source: Ambulatory Visit | Attending: Radiation Oncology | Admitting: Radiation Oncology

## 2023-04-08 ENCOUNTER — Other Ambulatory Visit: Payer: Self-pay

## 2023-04-08 DIAGNOSIS — I129 Hypertensive chronic kidney disease with stage 1 through stage 4 chronic kidney disease, or unspecified chronic kidney disease: Secondary | ICD-10-CM | POA: Diagnosis not present

## 2023-04-08 DIAGNOSIS — Z191 Hormone sensitive malignancy status: Secondary | ICD-10-CM | POA: Diagnosis not present

## 2023-04-08 DIAGNOSIS — N183 Chronic kidney disease, stage 3 unspecified: Secondary | ICD-10-CM | POA: Diagnosis not present

## 2023-04-08 DIAGNOSIS — Z87891 Personal history of nicotine dependence: Secondary | ICD-10-CM | POA: Diagnosis not present

## 2023-04-08 DIAGNOSIS — Z510 Encounter for antineoplastic radiation therapy: Secondary | ICD-10-CM | POA: Diagnosis not present

## 2023-04-08 DIAGNOSIS — C61 Malignant neoplasm of prostate: Secondary | ICD-10-CM | POA: Diagnosis not present

## 2023-04-08 DIAGNOSIS — R351 Nocturia: Secondary | ICD-10-CM | POA: Diagnosis not present

## 2023-04-08 DIAGNOSIS — E1122 Type 2 diabetes mellitus with diabetic chronic kidney disease: Secondary | ICD-10-CM | POA: Diagnosis not present

## 2023-04-08 DIAGNOSIS — Z79899 Other long term (current) drug therapy: Secondary | ICD-10-CM | POA: Diagnosis not present

## 2023-04-08 DIAGNOSIS — Z7982 Long term (current) use of aspirin: Secondary | ICD-10-CM | POA: Diagnosis not present

## 2023-04-08 DIAGNOSIS — E785 Hyperlipidemia, unspecified: Secondary | ICD-10-CM | POA: Diagnosis not present

## 2023-04-08 LAB — RAD ONC ARIA SESSION SUMMARY
Course Elapsed Days: 20
Plan Fractions Treated to Date: 13
Plan Prescribed Dose Per Fraction: 2.538 Gy
Plan Total Fractions Prescribed: 26
Plan Total Prescribed Dose: 65.988 Gy
Reference Point Dosage Given to Date: 36.994 Gy
Reference Point Session Dosage Given: 2.538 Gy
Session Number: 15

## 2023-04-09 ENCOUNTER — Ambulatory Visit: Admission: RE | Admit: 2023-04-09 | Payer: Medicare HMO | Source: Ambulatory Visit

## 2023-04-09 ENCOUNTER — Other Ambulatory Visit: Payer: Self-pay

## 2023-04-09 DIAGNOSIS — N183 Chronic kidney disease, stage 3 unspecified: Secondary | ICD-10-CM | POA: Diagnosis not present

## 2023-04-09 DIAGNOSIS — R351 Nocturia: Secondary | ICD-10-CM | POA: Diagnosis not present

## 2023-04-09 DIAGNOSIS — E785 Hyperlipidemia, unspecified: Secondary | ICD-10-CM | POA: Diagnosis not present

## 2023-04-09 DIAGNOSIS — I129 Hypertensive chronic kidney disease with stage 1 through stage 4 chronic kidney disease, or unspecified chronic kidney disease: Secondary | ICD-10-CM | POA: Diagnosis not present

## 2023-04-09 DIAGNOSIS — Z510 Encounter for antineoplastic radiation therapy: Secondary | ICD-10-CM | POA: Diagnosis not present

## 2023-04-09 DIAGNOSIS — E1122 Type 2 diabetes mellitus with diabetic chronic kidney disease: Secondary | ICD-10-CM | POA: Diagnosis not present

## 2023-04-09 DIAGNOSIS — Z79899 Other long term (current) drug therapy: Secondary | ICD-10-CM | POA: Diagnosis not present

## 2023-04-09 DIAGNOSIS — C61 Malignant neoplasm of prostate: Secondary | ICD-10-CM | POA: Diagnosis not present

## 2023-04-09 DIAGNOSIS — Z191 Hormone sensitive malignancy status: Secondary | ICD-10-CM | POA: Diagnosis not present

## 2023-04-09 DIAGNOSIS — Z87891 Personal history of nicotine dependence: Secondary | ICD-10-CM | POA: Diagnosis not present

## 2023-04-09 DIAGNOSIS — Z7982 Long term (current) use of aspirin: Secondary | ICD-10-CM | POA: Diagnosis not present

## 2023-04-09 LAB — RAD ONC ARIA SESSION SUMMARY
Course Elapsed Days: 21
Plan Fractions Treated to Date: 14
Plan Prescribed Dose Per Fraction: 2.538 Gy
Plan Total Fractions Prescribed: 26
Plan Total Prescribed Dose: 65.988 Gy
Reference Point Dosage Given to Date: 39.532 Gy
Reference Point Session Dosage Given: 2.538 Gy
Session Number: 16

## 2023-04-10 ENCOUNTER — Other Ambulatory Visit: Payer: Self-pay

## 2023-04-10 ENCOUNTER — Ambulatory Visit
Admission: RE | Admit: 2023-04-10 | Discharge: 2023-04-10 | Disposition: A | Discharge status: Home / Self Care | Payer: Medicare HMO | Source: Ambulatory Visit | Attending: Radiation Oncology | Admitting: Radiation Oncology

## 2023-04-10 DIAGNOSIS — R351 Nocturia: Secondary | ICD-10-CM | POA: Diagnosis not present

## 2023-04-10 DIAGNOSIS — I129 Hypertensive chronic kidney disease with stage 1 through stage 4 chronic kidney disease, or unspecified chronic kidney disease: Secondary | ICD-10-CM | POA: Diagnosis not present

## 2023-04-10 DIAGNOSIS — E785 Hyperlipidemia, unspecified: Secondary | ICD-10-CM | POA: Diagnosis not present

## 2023-04-10 DIAGNOSIS — N183 Chronic kidney disease, stage 3 unspecified: Secondary | ICD-10-CM | POA: Diagnosis not present

## 2023-04-10 DIAGNOSIS — Z87891 Personal history of nicotine dependence: Secondary | ICD-10-CM | POA: Diagnosis not present

## 2023-04-10 DIAGNOSIS — C61 Malignant neoplasm of prostate: Secondary | ICD-10-CM | POA: Diagnosis not present

## 2023-04-10 DIAGNOSIS — Z191 Hormone sensitive malignancy status: Secondary | ICD-10-CM | POA: Diagnosis not present

## 2023-04-10 DIAGNOSIS — E1122 Type 2 diabetes mellitus with diabetic chronic kidney disease: Secondary | ICD-10-CM | POA: Diagnosis not present

## 2023-04-10 DIAGNOSIS — Z7982 Long term (current) use of aspirin: Secondary | ICD-10-CM | POA: Diagnosis not present

## 2023-04-10 DIAGNOSIS — Z510 Encounter for antineoplastic radiation therapy: Secondary | ICD-10-CM | POA: Diagnosis not present

## 2023-04-10 DIAGNOSIS — Z79899 Other long term (current) drug therapy: Secondary | ICD-10-CM | POA: Diagnosis not present

## 2023-04-10 LAB — RAD ONC ARIA SESSION SUMMARY
Course Elapsed Days: 22
Plan Fractions Treated to Date: 15
Plan Prescribed Dose Per Fraction: 2.538 Gy
Plan Total Fractions Prescribed: 26
Plan Total Prescribed Dose: 65.988 Gy
Reference Point Dosage Given to Date: 42.07 Gy
Reference Point Session Dosage Given: 2.538 Gy
Session Number: 17

## 2023-04-11 ENCOUNTER — Other Ambulatory Visit: Payer: Self-pay

## 2023-04-11 ENCOUNTER — Ambulatory Visit
Admission: RE | Admit: 2023-04-11 | Discharge: 2023-04-11 | Disposition: A | Discharge status: Home / Self Care | Payer: Medicare HMO | Source: Ambulatory Visit | Attending: Radiation Oncology | Admitting: Radiation Oncology

## 2023-04-11 DIAGNOSIS — R351 Nocturia: Secondary | ICD-10-CM | POA: Diagnosis not present

## 2023-04-11 DIAGNOSIS — N183 Chronic kidney disease, stage 3 unspecified: Secondary | ICD-10-CM | POA: Diagnosis not present

## 2023-04-11 DIAGNOSIS — C61 Malignant neoplasm of prostate: Secondary | ICD-10-CM | POA: Diagnosis not present

## 2023-04-11 DIAGNOSIS — Z510 Encounter for antineoplastic radiation therapy: Secondary | ICD-10-CM | POA: Diagnosis not present

## 2023-04-11 DIAGNOSIS — Z87891 Personal history of nicotine dependence: Secondary | ICD-10-CM | POA: Diagnosis not present

## 2023-04-11 DIAGNOSIS — E785 Hyperlipidemia, unspecified: Secondary | ICD-10-CM | POA: Diagnosis not present

## 2023-04-11 DIAGNOSIS — Z7982 Long term (current) use of aspirin: Secondary | ICD-10-CM | POA: Diagnosis not present

## 2023-04-11 DIAGNOSIS — I129 Hypertensive chronic kidney disease with stage 1 through stage 4 chronic kidney disease, or unspecified chronic kidney disease: Secondary | ICD-10-CM | POA: Diagnosis not present

## 2023-04-11 DIAGNOSIS — E1122 Type 2 diabetes mellitus with diabetic chronic kidney disease: Secondary | ICD-10-CM | POA: Diagnosis not present

## 2023-04-11 DIAGNOSIS — Z79899 Other long term (current) drug therapy: Secondary | ICD-10-CM | POA: Diagnosis not present

## 2023-04-11 DIAGNOSIS — Z191 Hormone sensitive malignancy status: Secondary | ICD-10-CM | POA: Diagnosis not present

## 2023-04-11 LAB — RAD ONC ARIA SESSION SUMMARY
Course Elapsed Days: 23
Plan Fractions Treated to Date: 16
Plan Prescribed Dose Per Fraction: 2.538 Gy
Plan Total Fractions Prescribed: 26
Plan Total Prescribed Dose: 65.988 Gy
Reference Point Dosage Given to Date: 44.608 Gy
Reference Point Session Dosage Given: 2.538 Gy
Session Number: 18

## 2023-04-14 ENCOUNTER — Other Ambulatory Visit: Payer: Self-pay

## 2023-04-14 ENCOUNTER — Ambulatory Visit
Admission: RE | Admit: 2023-04-14 | Discharge: 2023-04-14 | Disposition: A | Discharge status: Home / Self Care | Payer: Medicare HMO | Source: Ambulatory Visit | Attending: Radiation Oncology | Admitting: Radiation Oncology

## 2023-04-14 DIAGNOSIS — Z510 Encounter for antineoplastic radiation therapy: Secondary | ICD-10-CM | POA: Diagnosis not present

## 2023-04-14 DIAGNOSIS — E1122 Type 2 diabetes mellitus with diabetic chronic kidney disease: Secondary | ICD-10-CM | POA: Diagnosis not present

## 2023-04-14 DIAGNOSIS — I129 Hypertensive chronic kidney disease with stage 1 through stage 4 chronic kidney disease, or unspecified chronic kidney disease: Secondary | ICD-10-CM | POA: Diagnosis not present

## 2023-04-14 DIAGNOSIS — C61 Malignant neoplasm of prostate: Secondary | ICD-10-CM | POA: Diagnosis not present

## 2023-04-14 DIAGNOSIS — N183 Chronic kidney disease, stage 3 unspecified: Secondary | ICD-10-CM | POA: Diagnosis not present

## 2023-04-14 DIAGNOSIS — Z79899 Other long term (current) drug therapy: Secondary | ICD-10-CM | POA: Diagnosis not present

## 2023-04-14 DIAGNOSIS — R351 Nocturia: Secondary | ICD-10-CM | POA: Diagnosis not present

## 2023-04-14 DIAGNOSIS — Z87891 Personal history of nicotine dependence: Secondary | ICD-10-CM | POA: Diagnosis not present

## 2023-04-14 DIAGNOSIS — E785 Hyperlipidemia, unspecified: Secondary | ICD-10-CM | POA: Diagnosis not present

## 2023-04-14 DIAGNOSIS — Z191 Hormone sensitive malignancy status: Secondary | ICD-10-CM | POA: Diagnosis not present

## 2023-04-14 DIAGNOSIS — Z7982 Long term (current) use of aspirin: Secondary | ICD-10-CM | POA: Diagnosis not present

## 2023-04-14 LAB — RAD ONC ARIA SESSION SUMMARY
Course Elapsed Days: 26
Plan Fractions Treated to Date: 17
Plan Prescribed Dose Per Fraction: 2.538 Gy
Plan Total Fractions Prescribed: 26
Plan Total Prescribed Dose: 65.988 Gy
Reference Point Dosage Given to Date: 47.146 Gy
Reference Point Session Dosage Given: 2.538 Gy
Session Number: 19

## 2023-04-15 ENCOUNTER — Ambulatory Visit: Admission: RE | Admit: 2023-04-15 | Payer: Medicare HMO | Source: Ambulatory Visit

## 2023-04-15 ENCOUNTER — Other Ambulatory Visit: Payer: Self-pay

## 2023-04-15 DIAGNOSIS — R351 Nocturia: Secondary | ICD-10-CM | POA: Diagnosis not present

## 2023-04-15 DIAGNOSIS — E785 Hyperlipidemia, unspecified: Secondary | ICD-10-CM | POA: Diagnosis not present

## 2023-04-15 DIAGNOSIS — C61 Malignant neoplasm of prostate: Secondary | ICD-10-CM | POA: Diagnosis not present

## 2023-04-15 DIAGNOSIS — Z7982 Long term (current) use of aspirin: Secondary | ICD-10-CM | POA: Diagnosis not present

## 2023-04-15 DIAGNOSIS — E1122 Type 2 diabetes mellitus with diabetic chronic kidney disease: Secondary | ICD-10-CM | POA: Diagnosis not present

## 2023-04-15 DIAGNOSIS — Z191 Hormone sensitive malignancy status: Secondary | ICD-10-CM | POA: Diagnosis not present

## 2023-04-15 DIAGNOSIS — Z510 Encounter for antineoplastic radiation therapy: Secondary | ICD-10-CM | POA: Diagnosis not present

## 2023-04-15 DIAGNOSIS — Z87891 Personal history of nicotine dependence: Secondary | ICD-10-CM | POA: Diagnosis not present

## 2023-04-15 DIAGNOSIS — I129 Hypertensive chronic kidney disease with stage 1 through stage 4 chronic kidney disease, or unspecified chronic kidney disease: Secondary | ICD-10-CM | POA: Diagnosis not present

## 2023-04-15 DIAGNOSIS — Z79899 Other long term (current) drug therapy: Secondary | ICD-10-CM | POA: Diagnosis not present

## 2023-04-15 DIAGNOSIS — N183 Chronic kidney disease, stage 3 unspecified: Secondary | ICD-10-CM | POA: Diagnosis not present

## 2023-04-15 LAB — RAD ONC ARIA SESSION SUMMARY
Course Elapsed Days: 27
Plan Fractions Treated to Date: 18
Plan Prescribed Dose Per Fraction: 2.538 Gy
Plan Total Fractions Prescribed: 26
Plan Total Prescribed Dose: 65.988 Gy
Reference Point Dosage Given to Date: 49.684 Gy
Reference Point Session Dosage Given: 2.538 Gy
Session Number: 20

## 2023-04-16 ENCOUNTER — Other Ambulatory Visit: Payer: Self-pay

## 2023-04-16 ENCOUNTER — Ambulatory Visit: Admission: RE | Admit: 2023-04-16 | Payer: Medicare HMO | Source: Ambulatory Visit

## 2023-04-16 DIAGNOSIS — Z87891 Personal history of nicotine dependence: Secondary | ICD-10-CM | POA: Diagnosis not present

## 2023-04-16 DIAGNOSIS — Z7982 Long term (current) use of aspirin: Secondary | ICD-10-CM | POA: Diagnosis not present

## 2023-04-16 DIAGNOSIS — Z191 Hormone sensitive malignancy status: Secondary | ICD-10-CM | POA: Diagnosis not present

## 2023-04-16 DIAGNOSIS — Z510 Encounter for antineoplastic radiation therapy: Secondary | ICD-10-CM | POA: Diagnosis not present

## 2023-04-16 DIAGNOSIS — I129 Hypertensive chronic kidney disease with stage 1 through stage 4 chronic kidney disease, or unspecified chronic kidney disease: Secondary | ICD-10-CM | POA: Diagnosis not present

## 2023-04-16 DIAGNOSIS — E785 Hyperlipidemia, unspecified: Secondary | ICD-10-CM | POA: Diagnosis not present

## 2023-04-16 DIAGNOSIS — Z79899 Other long term (current) drug therapy: Secondary | ICD-10-CM | POA: Diagnosis not present

## 2023-04-16 DIAGNOSIS — N183 Chronic kidney disease, stage 3 unspecified: Secondary | ICD-10-CM | POA: Diagnosis not present

## 2023-04-16 DIAGNOSIS — E1122 Type 2 diabetes mellitus with diabetic chronic kidney disease: Secondary | ICD-10-CM | POA: Diagnosis not present

## 2023-04-16 DIAGNOSIS — C61 Malignant neoplasm of prostate: Secondary | ICD-10-CM | POA: Diagnosis not present

## 2023-04-16 DIAGNOSIS — R351 Nocturia: Secondary | ICD-10-CM | POA: Diagnosis not present

## 2023-04-16 LAB — RAD ONC ARIA SESSION SUMMARY
Course Elapsed Days: 28
Plan Fractions Treated to Date: 19
Plan Prescribed Dose Per Fraction: 2.538 Gy
Plan Total Fractions Prescribed: 26
Plan Total Prescribed Dose: 65.988 Gy
Reference Point Dosage Given to Date: 52.222 Gy
Reference Point Session Dosage Given: 2.538 Gy
Session Number: 21

## 2023-04-17 ENCOUNTER — Ambulatory Visit
Admission: RE | Admit: 2023-04-17 | Discharge: 2023-04-17 | Disposition: A | Discharge status: Home / Self Care | Payer: Medicare HMO | Source: Ambulatory Visit | Attending: Radiation Oncology | Admitting: Radiation Oncology

## 2023-04-17 ENCOUNTER — Other Ambulatory Visit: Payer: Self-pay

## 2023-04-17 DIAGNOSIS — Z7982 Long term (current) use of aspirin: Secondary | ICD-10-CM | POA: Diagnosis not present

## 2023-04-17 DIAGNOSIS — R351 Nocturia: Secondary | ICD-10-CM | POA: Diagnosis not present

## 2023-04-17 DIAGNOSIS — E785 Hyperlipidemia, unspecified: Secondary | ICD-10-CM | POA: Diagnosis not present

## 2023-04-17 DIAGNOSIS — C61 Malignant neoplasm of prostate: Secondary | ICD-10-CM | POA: Diagnosis not present

## 2023-04-17 DIAGNOSIS — Z191 Hormone sensitive malignancy status: Secondary | ICD-10-CM | POA: Diagnosis not present

## 2023-04-17 DIAGNOSIS — E1122 Type 2 diabetes mellitus with diabetic chronic kidney disease: Secondary | ICD-10-CM | POA: Diagnosis not present

## 2023-04-17 DIAGNOSIS — Z87891 Personal history of nicotine dependence: Secondary | ICD-10-CM | POA: Diagnosis not present

## 2023-04-17 DIAGNOSIS — I129 Hypertensive chronic kidney disease with stage 1 through stage 4 chronic kidney disease, or unspecified chronic kidney disease: Secondary | ICD-10-CM | POA: Diagnosis not present

## 2023-04-17 DIAGNOSIS — Z79899 Other long term (current) drug therapy: Secondary | ICD-10-CM | POA: Diagnosis not present

## 2023-04-17 DIAGNOSIS — Z510 Encounter for antineoplastic radiation therapy: Secondary | ICD-10-CM | POA: Diagnosis not present

## 2023-04-17 DIAGNOSIS — N183 Chronic kidney disease, stage 3 unspecified: Secondary | ICD-10-CM | POA: Diagnosis not present

## 2023-04-17 LAB — RAD ONC ARIA SESSION SUMMARY
Course Elapsed Days: 29
Plan Fractions Treated to Date: 20
Plan Prescribed Dose Per Fraction: 2.538 Gy
Plan Total Fractions Prescribed: 26
Plan Total Prescribed Dose: 65.988 Gy
Reference Point Dosage Given to Date: 54.76 Gy
Reference Point Session Dosage Given: 2.538 Gy
Session Number: 22

## 2023-04-18 ENCOUNTER — Other Ambulatory Visit: Payer: Self-pay

## 2023-04-18 ENCOUNTER — Ambulatory Visit
Admission: RE | Admit: 2023-04-18 | Discharge: 2023-04-18 | Disposition: A | Discharge status: Home / Self Care | Payer: Medicare HMO | Source: Ambulatory Visit | Attending: Radiation Oncology | Admitting: Radiation Oncology

## 2023-04-18 DIAGNOSIS — R351 Nocturia: Secondary | ICD-10-CM | POA: Diagnosis not present

## 2023-04-18 DIAGNOSIS — E1122 Type 2 diabetes mellitus with diabetic chronic kidney disease: Secondary | ICD-10-CM | POA: Diagnosis not present

## 2023-04-18 DIAGNOSIS — Z510 Encounter for antineoplastic radiation therapy: Secondary | ICD-10-CM | POA: Diagnosis not present

## 2023-04-18 DIAGNOSIS — Z191 Hormone sensitive malignancy status: Secondary | ICD-10-CM | POA: Diagnosis not present

## 2023-04-18 DIAGNOSIS — Z87891 Personal history of nicotine dependence: Secondary | ICD-10-CM | POA: Diagnosis not present

## 2023-04-18 DIAGNOSIS — N183 Chronic kidney disease, stage 3 unspecified: Secondary | ICD-10-CM | POA: Diagnosis not present

## 2023-04-18 DIAGNOSIS — I129 Hypertensive chronic kidney disease with stage 1 through stage 4 chronic kidney disease, or unspecified chronic kidney disease: Secondary | ICD-10-CM | POA: Diagnosis not present

## 2023-04-18 DIAGNOSIS — Z79899 Other long term (current) drug therapy: Secondary | ICD-10-CM | POA: Diagnosis not present

## 2023-04-18 DIAGNOSIS — C61 Malignant neoplasm of prostate: Secondary | ICD-10-CM | POA: Diagnosis not present

## 2023-04-18 DIAGNOSIS — Z7982 Long term (current) use of aspirin: Secondary | ICD-10-CM | POA: Diagnosis not present

## 2023-04-18 DIAGNOSIS — E785 Hyperlipidemia, unspecified: Secondary | ICD-10-CM | POA: Diagnosis not present

## 2023-04-18 LAB — RAD ONC ARIA SESSION SUMMARY
Course Elapsed Days: 30
Plan Fractions Treated to Date: 21
Plan Prescribed Dose Per Fraction: 2.538 Gy
Plan Total Fractions Prescribed: 26
Plan Total Prescribed Dose: 65.988 Gy
Reference Point Dosage Given to Date: 57.298 Gy
Reference Point Session Dosage Given: 2.538 Gy
Session Number: 23

## 2023-04-21 ENCOUNTER — Other Ambulatory Visit: Payer: Self-pay

## 2023-04-21 ENCOUNTER — Inpatient Hospital Stay: Payer: Medicare HMO

## 2023-04-21 ENCOUNTER — Ambulatory Visit
Admission: RE | Admit: 2023-04-21 | Discharge: 2023-04-21 | Disposition: A | Discharge status: Home / Self Care | Payer: Medicare HMO | Source: Ambulatory Visit | Attending: Radiation Oncology | Admitting: Radiation Oncology

## 2023-04-21 DIAGNOSIS — Z510 Encounter for antineoplastic radiation therapy: Secondary | ICD-10-CM | POA: Diagnosis not present

## 2023-04-21 DIAGNOSIS — E1122 Type 2 diabetes mellitus with diabetic chronic kidney disease: Secondary | ICD-10-CM | POA: Diagnosis not present

## 2023-04-21 DIAGNOSIS — Z87891 Personal history of nicotine dependence: Secondary | ICD-10-CM | POA: Diagnosis not present

## 2023-04-21 DIAGNOSIS — E785 Hyperlipidemia, unspecified: Secondary | ICD-10-CM | POA: Diagnosis not present

## 2023-04-21 DIAGNOSIS — C61 Malignant neoplasm of prostate: Secondary | ICD-10-CM

## 2023-04-21 DIAGNOSIS — R351 Nocturia: Secondary | ICD-10-CM | POA: Diagnosis not present

## 2023-04-21 DIAGNOSIS — Z191 Hormone sensitive malignancy status: Secondary | ICD-10-CM | POA: Diagnosis not present

## 2023-04-21 DIAGNOSIS — Z79899 Other long term (current) drug therapy: Secondary | ICD-10-CM | POA: Diagnosis not present

## 2023-04-21 DIAGNOSIS — I129 Hypertensive chronic kidney disease with stage 1 through stage 4 chronic kidney disease, or unspecified chronic kidney disease: Secondary | ICD-10-CM | POA: Diagnosis not present

## 2023-04-21 DIAGNOSIS — Z7982 Long term (current) use of aspirin: Secondary | ICD-10-CM | POA: Diagnosis not present

## 2023-04-21 DIAGNOSIS — N183 Chronic kidney disease, stage 3 unspecified: Secondary | ICD-10-CM | POA: Diagnosis not present

## 2023-04-21 LAB — CBC (CANCER CENTER ONLY)
HCT: 36.4 % — ABNORMAL LOW (ref 39.0–52.0)
Hemoglobin: 12.1 g/dL — ABNORMAL LOW (ref 13.0–17.0)
MCH: 27.2 pg (ref 26.0–34.0)
MCHC: 33.2 g/dL (ref 30.0–36.0)
MCV: 81.8 fL (ref 80.0–100.0)
Platelet Count: 233 10*3/uL (ref 150–400)
RBC: 4.45 MIL/uL (ref 4.22–5.81)
RDW: 12.4 % (ref 11.5–15.5)
WBC Count: 4.2 10*3/uL (ref 4.0–10.5)
nRBC: 0 % (ref 0.0–0.2)

## 2023-04-21 LAB — RAD ONC ARIA SESSION SUMMARY
Course Elapsed Days: 33
Plan Fractions Treated to Date: 22
Plan Prescribed Dose Per Fraction: 2.538 Gy
Plan Total Fractions Prescribed: 26
Plan Total Prescribed Dose: 65.988 Gy
Reference Point Dosage Given to Date: 59.836 Gy
Reference Point Session Dosage Given: 2.538 Gy
Session Number: 24

## 2023-04-22 ENCOUNTER — Ambulatory Visit
Admission: RE | Admit: 2023-04-22 | Discharge: 2023-04-22 | Disposition: A | Discharge status: Home / Self Care | Payer: Medicare HMO | Source: Ambulatory Visit | Attending: Radiation Oncology | Admitting: Radiation Oncology

## 2023-04-22 ENCOUNTER — Other Ambulatory Visit: Payer: Self-pay

## 2023-04-22 DIAGNOSIS — Z7982 Long term (current) use of aspirin: Secondary | ICD-10-CM | POA: Diagnosis not present

## 2023-04-22 DIAGNOSIS — N183 Chronic kidney disease, stage 3 unspecified: Secondary | ICD-10-CM | POA: Diagnosis not present

## 2023-04-22 DIAGNOSIS — E785 Hyperlipidemia, unspecified: Secondary | ICD-10-CM | POA: Diagnosis not present

## 2023-04-22 DIAGNOSIS — I129 Hypertensive chronic kidney disease with stage 1 through stage 4 chronic kidney disease, or unspecified chronic kidney disease: Secondary | ICD-10-CM | POA: Diagnosis not present

## 2023-04-22 DIAGNOSIS — R351 Nocturia: Secondary | ICD-10-CM | POA: Diagnosis not present

## 2023-04-22 DIAGNOSIS — Z79899 Other long term (current) drug therapy: Secondary | ICD-10-CM | POA: Diagnosis not present

## 2023-04-22 DIAGNOSIS — Z510 Encounter for antineoplastic radiation therapy: Secondary | ICD-10-CM | POA: Diagnosis not present

## 2023-04-22 DIAGNOSIS — E1122 Type 2 diabetes mellitus with diabetic chronic kidney disease: Secondary | ICD-10-CM | POA: Diagnosis not present

## 2023-04-22 DIAGNOSIS — Z191 Hormone sensitive malignancy status: Secondary | ICD-10-CM | POA: Diagnosis not present

## 2023-04-22 DIAGNOSIS — C61 Malignant neoplasm of prostate: Secondary | ICD-10-CM | POA: Diagnosis not present

## 2023-04-22 DIAGNOSIS — Z87891 Personal history of nicotine dependence: Secondary | ICD-10-CM | POA: Diagnosis not present

## 2023-04-22 LAB — RAD ONC ARIA SESSION SUMMARY
Course Elapsed Days: 34
Plan Fractions Treated to Date: 23
Plan Prescribed Dose Per Fraction: 2.538 Gy
Plan Total Fractions Prescribed: 26
Plan Total Prescribed Dose: 65.988 Gy
Reference Point Dosage Given to Date: 62.374 Gy
Reference Point Session Dosage Given: 2.538 Gy
Session Number: 25

## 2023-04-23 ENCOUNTER — Other Ambulatory Visit: Payer: Self-pay

## 2023-04-23 ENCOUNTER — Ambulatory Visit
Admission: RE | Admit: 2023-04-23 | Discharge: 2023-04-23 | Disposition: A | Discharge status: Home / Self Care | Payer: Medicare HMO | Source: Ambulatory Visit | Attending: Radiation Oncology | Admitting: Radiation Oncology

## 2023-04-23 DIAGNOSIS — Z510 Encounter for antineoplastic radiation therapy: Secondary | ICD-10-CM | POA: Diagnosis not present

## 2023-04-23 DIAGNOSIS — E1122 Type 2 diabetes mellitus with diabetic chronic kidney disease: Secondary | ICD-10-CM | POA: Diagnosis not present

## 2023-04-23 DIAGNOSIS — R351 Nocturia: Secondary | ICD-10-CM | POA: Diagnosis not present

## 2023-04-23 DIAGNOSIS — Z7982 Long term (current) use of aspirin: Secondary | ICD-10-CM | POA: Diagnosis not present

## 2023-04-23 DIAGNOSIS — Z79899 Other long term (current) drug therapy: Secondary | ICD-10-CM | POA: Diagnosis not present

## 2023-04-23 DIAGNOSIS — N183 Chronic kidney disease, stage 3 unspecified: Secondary | ICD-10-CM | POA: Diagnosis not present

## 2023-04-23 DIAGNOSIS — E785 Hyperlipidemia, unspecified: Secondary | ICD-10-CM | POA: Diagnosis not present

## 2023-04-23 DIAGNOSIS — C61 Malignant neoplasm of prostate: Secondary | ICD-10-CM | POA: Diagnosis not present

## 2023-04-23 DIAGNOSIS — Z87891 Personal history of nicotine dependence: Secondary | ICD-10-CM | POA: Diagnosis not present

## 2023-04-23 DIAGNOSIS — I129 Hypertensive chronic kidney disease with stage 1 through stage 4 chronic kidney disease, or unspecified chronic kidney disease: Secondary | ICD-10-CM | POA: Diagnosis not present

## 2023-04-23 DIAGNOSIS — Z191 Hormone sensitive malignancy status: Secondary | ICD-10-CM | POA: Diagnosis not present

## 2023-04-23 LAB — RAD ONC ARIA SESSION SUMMARY
Course Elapsed Days: 35
Plan Fractions Treated to Date: 24
Plan Prescribed Dose Per Fraction: 2.538 Gy
Plan Total Fractions Prescribed: 26
Plan Total Prescribed Dose: 65.988 Gy
Reference Point Dosage Given to Date: 64.912 Gy
Reference Point Session Dosage Given: 2.538 Gy
Session Number: 26

## 2023-04-24 ENCOUNTER — Other Ambulatory Visit: Payer: Self-pay

## 2023-04-24 ENCOUNTER — Ambulatory Visit
Admission: RE | Admit: 2023-04-24 | Discharge: 2023-04-24 | Disposition: A | Discharge status: Home / Self Care | Payer: Medicare HMO | Source: Ambulatory Visit | Attending: Radiation Oncology | Admitting: Radiation Oncology

## 2023-04-24 DIAGNOSIS — E1122 Type 2 diabetes mellitus with diabetic chronic kidney disease: Secondary | ICD-10-CM | POA: Diagnosis not present

## 2023-04-24 DIAGNOSIS — E785 Hyperlipidemia, unspecified: Secondary | ICD-10-CM | POA: Diagnosis not present

## 2023-04-24 DIAGNOSIS — Z87891 Personal history of nicotine dependence: Secondary | ICD-10-CM | POA: Diagnosis not present

## 2023-04-24 DIAGNOSIS — Z191 Hormone sensitive malignancy status: Secondary | ICD-10-CM | POA: Diagnosis not present

## 2023-04-24 DIAGNOSIS — Z79899 Other long term (current) drug therapy: Secondary | ICD-10-CM | POA: Diagnosis not present

## 2023-04-24 DIAGNOSIS — I129 Hypertensive chronic kidney disease with stage 1 through stage 4 chronic kidney disease, or unspecified chronic kidney disease: Secondary | ICD-10-CM | POA: Diagnosis not present

## 2023-04-24 DIAGNOSIS — C61 Malignant neoplasm of prostate: Secondary | ICD-10-CM | POA: Diagnosis not present

## 2023-04-24 DIAGNOSIS — R351 Nocturia: Secondary | ICD-10-CM | POA: Diagnosis not present

## 2023-04-24 DIAGNOSIS — N183 Chronic kidney disease, stage 3 unspecified: Secondary | ICD-10-CM | POA: Diagnosis not present

## 2023-04-24 DIAGNOSIS — Z510 Encounter for antineoplastic radiation therapy: Secondary | ICD-10-CM | POA: Diagnosis not present

## 2023-04-24 DIAGNOSIS — Z7982 Long term (current) use of aspirin: Secondary | ICD-10-CM | POA: Diagnosis not present

## 2023-04-24 LAB — RAD ONC ARIA SESSION SUMMARY
Course Elapsed Days: 36
Plan Fractions Treated to Date: 25
Plan Prescribed Dose Per Fraction: 2.538 Gy
Plan Total Fractions Prescribed: 26
Plan Total Prescribed Dose: 65.988 Gy
Reference Point Dosage Given to Date: 67.45 Gy
Reference Point Session Dosage Given: 2.538 Gy
Session Number: 27

## 2023-04-25 ENCOUNTER — Ambulatory Visit
Admission: RE | Admit: 2023-04-25 | Discharge: 2023-04-25 | Disposition: A | Discharge status: Home / Self Care | Payer: Medicare HMO | Source: Ambulatory Visit | Attending: Radiation Oncology | Admitting: Radiation Oncology

## 2023-04-25 ENCOUNTER — Other Ambulatory Visit: Payer: Self-pay

## 2023-04-25 DIAGNOSIS — C61 Malignant neoplasm of prostate: Secondary | ICD-10-CM | POA: Diagnosis not present

## 2023-04-25 DIAGNOSIS — E785 Hyperlipidemia, unspecified: Secondary | ICD-10-CM | POA: Diagnosis not present

## 2023-04-25 DIAGNOSIS — Z191 Hormone sensitive malignancy status: Secondary | ICD-10-CM | POA: Diagnosis not present

## 2023-04-25 DIAGNOSIS — E1122 Type 2 diabetes mellitus with diabetic chronic kidney disease: Secondary | ICD-10-CM | POA: Diagnosis not present

## 2023-04-25 DIAGNOSIS — N183 Chronic kidney disease, stage 3 unspecified: Secondary | ICD-10-CM | POA: Diagnosis not present

## 2023-04-25 DIAGNOSIS — Z510 Encounter for antineoplastic radiation therapy: Secondary | ICD-10-CM | POA: Diagnosis not present

## 2023-04-25 DIAGNOSIS — Z79899 Other long term (current) drug therapy: Secondary | ICD-10-CM | POA: Diagnosis not present

## 2023-04-25 DIAGNOSIS — R351 Nocturia: Secondary | ICD-10-CM | POA: Diagnosis not present

## 2023-04-25 DIAGNOSIS — Z7982 Long term (current) use of aspirin: Secondary | ICD-10-CM | POA: Diagnosis not present

## 2023-04-25 DIAGNOSIS — Z87891 Personal history of nicotine dependence: Secondary | ICD-10-CM | POA: Diagnosis not present

## 2023-04-25 DIAGNOSIS — I129 Hypertensive chronic kidney disease with stage 1 through stage 4 chronic kidney disease, or unspecified chronic kidney disease: Secondary | ICD-10-CM | POA: Diagnosis not present

## 2023-04-25 LAB — RAD ONC ARIA SESSION SUMMARY
Course Elapsed Days: 37
Plan Fractions Treated to Date: 26
Plan Prescribed Dose Per Fraction: 2.538 Gy
Plan Total Fractions Prescribed: 26
Plan Total Prescribed Dose: 65.988 Gy
Reference Point Dosage Given to Date: 69.988 Gy
Reference Point Session Dosage Given: 2.538 Gy
Session Number: 28

## 2023-04-29 ENCOUNTER — Ambulatory Visit: Payer: Medicare HMO

## 2023-04-30 ENCOUNTER — Ambulatory Visit: Payer: Medicare HMO

## 2023-05-01 ENCOUNTER — Ambulatory Visit: Payer: Medicare HMO

## 2023-05-02 ENCOUNTER — Ambulatory Visit: Payer: Medicare HMO

## 2023-05-05 ENCOUNTER — Ambulatory Visit: Payer: Medicare HMO

## 2023-05-05 ENCOUNTER — Inpatient Hospital Stay: Payer: Medicare HMO | Attending: Radiation Oncology

## 2023-05-05 DIAGNOSIS — C61 Malignant neoplasm of prostate: Secondary | ICD-10-CM | POA: Insufficient documentation

## 2023-05-05 LAB — CBC (CANCER CENTER ONLY)
HCT: 35.8 % — ABNORMAL LOW (ref 39.0–52.0)
Hemoglobin: 11.7 g/dL — ABNORMAL LOW (ref 13.0–17.0)
MCH: 26.8 pg (ref 26.0–34.0)
MCHC: 32.7 g/dL (ref 30.0–36.0)
MCV: 81.9 fL (ref 80.0–100.0)
Platelet Count: 235 10*3/uL (ref 150–400)
RBC: 4.37 MIL/uL (ref 4.22–5.81)
RDW: 12.7 % (ref 11.5–15.5)
WBC Count: 4 10*3/uL (ref 4.0–10.5)
nRBC: 0 % (ref 0.0–0.2)

## 2023-05-06 ENCOUNTER — Ambulatory Visit: Payer: Medicare HMO

## 2023-05-07 ENCOUNTER — Ambulatory Visit: Payer: Medicare HMO

## 2023-05-07 ENCOUNTER — Ambulatory Visit: Payer: Medicare HMO | Admitting: Nurse Practitioner

## 2023-05-08 ENCOUNTER — Ambulatory Visit: Payer: Medicare HMO

## 2023-05-09 ENCOUNTER — Ambulatory Visit: Payer: Medicare HMO

## 2023-05-10 NOTE — Patient Instructions (Signed)
Be Involved in Caring For Your Health:  Taking Medications When medications are taken as directed, they can greatly improve your health. But if they are not taken as prescribed, they may not work. In some cases, not taking them correctly can be harmful. To help ensure your treatment remains effective and safe, understand your medications and how to take them. Bring your medications to each visit for review by your provider.  Your lab results, notes, and after visit summary will be available on My Chart. We strongly encourage you to use this feature. If lab results are abnormal the clinic will contact you with the appropriate steps. If the clinic does not contact you assume the results are satisfactory. You can always view your results on My Chart. If you have questions regarding your health or results, please contact the clinic during office hours. You can also ask questions on My Chart.  We at [facility] are grateful that you chose [place] to provide your care. We strive to provide evidence-based and compassionate care and are always looking for feedback. If you get a survey from the clinic please complete this so we can hear your opinions.   Diabetes Mellitus and Nutrition, Adult When you have diabetes, or diabetes mellitus, it is very important to have healthy eating habits because your blood sugar (glucose) levels are greatly affected by what you eat and drink. Eating healthy foods in the right amounts, at about the same times every day, can help you: Manage your blood glucose. Lower your risk of heart disease. Improve your blood pressure. Reach or maintain a healthy weight. What can affect my meal plan? Every person with diabetes is different, and each person has different needs for a meal plan. Your health care provider may recommend that you work with a dietitian to make a meal plan that is best for you. Your meal plan may vary depending on factors such as: The calories you  need. The medicines you take. Your weight. Your blood glucose, blood pressure, and cholesterol levels. Your activity level. Other health conditions you have, such as heart or kidney disease. How do carbohydrates affect me? Carbohydrates, also called carbs, affect your blood glucose level more than any other type of food. Eating carbs raises the amount of glucose in your blood. It is important to know how many carbs you can safely have in each meal. This is different for every person. Your dietitian can help you calculate how many carbs you should have at each meal and for each snack. How does alcohol affect me? Alcohol can cause a decrease in blood glucose (hypoglycemia), especially if you use insulin or take certain diabetes medicines by mouth. Hypoglycemia can be a life-threatening condition. Symptoms of hypoglycemia, such as sleepiness, dizziness, and confusion, are similar to symptoms of having too much alcohol. Do not drink alcohol if: Your health care provider tells you not to drink. You are pregnant, may be pregnant, or are planning to become pregnant. If you drink alcohol: Limit how much you have to: 0-1 drink a day for women. 0-2 drinks a day for men. Know how much alcohol is in your drink. In the [place] one drink equals one 12 oz bottle of beer (355 mL), one 5 oz glass of wine (148 mL), or one 1 oz glass of hard liquor (44 mL). Keep yourself hydrated with water, diet soda, or unsweetened iced tea. Keep in mind that regular soda, juice, and other mixers may contain a lot of sugar and  must be counted as carbs. What are tips for following this plan?  Reading food labels Start by checking the serving size on the Nutrition Facts label of packaged foods and drinks. The number of calories and the amount of carbs, fats, and other nutrients listed on the label are based on one serving of the item. Many items contain more than one serving per package. Check the total grams (g) of carbs in one  serving. Check the number of grams of saturated fats and trans fats in one serving. Choose foods that have a low amount or none of these fats. Check the number of milligrams (mg) of salt (sodium) in one serving. Most people should limit total sodium intake to less than 2,300 mg per day. Always check the nutrition information of foods labeled as "low-fat" or "nonfat." These foods may be higher in added sugar or refined carbs and should be avoided. Talk to your dietitian to identify your daily goals for nutrients listed on the label. Shopping Avoid buying canned, pre-made, or processed foods. These foods tend to be high in fat, sodium, and added sugar. Shop around the outside edge of the grocery store. This is where you will most often find fresh fruits and vegetables, bulk grains, fresh meats, and fresh dairy products. Cooking Use low-heat cooking methods, such as baking, instead of high-heat cooking methods, such as deep frying. Cook using healthy oils, such as olive, canola, or sunflower oil. Avoid cooking with butter, cream, or high-fat meats. Meal planning Eat meals and snacks regularly, preferably at the same times every day. Avoid going long periods of time without eating. Eat foods that are high in fiber, such as fresh fruits, vegetables, beans, and whole grains. Eat 4-6 oz (112-168 g) of lean protein each day, such as lean meat, chicken, fish, eggs, or tofu. One ounce (oz) (28 g) of lean protein is equal to: 1 oz (28 g) of meat, chicken, or fish. 1 egg.  cup (62 g) of tofu. Eat some foods each day that contain healthy fats, such as avocado, nuts, seeds, and fish. What foods should I eat? Fruits Berries. Apples. Oranges. Peaches. Apricots. Plums. Grapes. Mangoes. Papayas. Pomegranates. Kiwi. Cherries. Vegetables Leafy greens, including lettuce, spinach, kale, chard, collard greens, mustard greens, and cabbage. Beets. Cauliflower. Broccoli. Carrots. Green beans. Tomatoes. Peppers.  Onions. Cucumbers. Brussels sprouts. Grains Whole grains, such as whole-wheat or whole-grain bread, crackers, tortillas, cereal, and pasta. Unsweetened oatmeal. Quinoa. Brown or wild rice. Meats and other proteins Seafood. Poultry without skin. Lean cuts of poultry and beef. Tofu. Nuts. Seeds. Dairy Low-fat or fat-free dairy products such as milk, yogurt, and cheese. The items listed above may not be a complete list of foods and beverages you can eat and drink. Contact a dietitian for more information. What foods should I avoid? Fruits Fruits canned with syrup. Vegetables Canned vegetables. Frozen vegetables with butter or cream sauce. Grains Refined white flour and flour products such as bread, pasta, snack foods, and cereals. Avoid all processed foods. Meats and other proteins Fatty cuts of meat. Poultry with skin. Breaded or fried meats. Processed meat. Avoid saturated fats. Dairy Full-fat yogurt, cheese, or milk. Beverages Sweetened drinks, such as soda or iced tea. The items listed above may not be a complete list of foods and beverages you should avoid. Contact a dietitian for more information. Questions to ask a health care provider Do I need to meet with a certified diabetes care and education specialist? Do I need to meet with  a dietitian? What number can I call if I have questions? When are the best times to check my blood glucose? Where to find more information: American Diabetes Association: diabetes.org Academy of Nutrition and Dietetics: eatright[employer] of Diabetes and Digestive and Kidney Diseases: [email or web address] Association of Diabetes Care & Education Specialists: diabeteseducator.org Summary It is important to have healthy eating habits because your blood sugar (glucose) levels are greatly affected by what you eat and drink. It is important to use alcohol carefully. A healthy meal plan will help you manage your blood glucose and lower your risk of  heart disease. Your health care provider may recommend that you work with a dietitian to make a meal plan that is best for you. This information is not intended to replace advice given to you by your health care provider. Make sure you discuss any questions you have with your health care provider. Document Revised: 03/15/2020 Document Reviewed: 03/15/2020 Elsevier Patient Education  2024 [employer].

## 2023-05-12 ENCOUNTER — Encounter: Payer: Self-pay | Admitting: Nurse Practitioner

## 2023-05-12 ENCOUNTER — Ambulatory Visit: Payer: Medicare HMO

## 2023-05-12 ENCOUNTER — Ambulatory Visit (INDEPENDENT_AMBULATORY_CARE_PROVIDER_SITE_OTHER): Payer: Medicare HMO | Admitting: Nurse Practitioner

## 2023-05-12 VITALS — BP 109/64 | HR 69 | Temp 98.1°F | Ht 70.5 in | Wt 207.8 lb

## 2023-05-12 DIAGNOSIS — Z87891 Personal history of nicotine dependence: Secondary | ICD-10-CM | POA: Diagnosis not present

## 2023-05-12 DIAGNOSIS — E1159 Type 2 diabetes mellitus with other circulatory complications: Secondary | ICD-10-CM | POA: Diagnosis not present

## 2023-05-12 DIAGNOSIS — E1169 Type 2 diabetes mellitus with other specified complication: Secondary | ICD-10-CM | POA: Diagnosis not present

## 2023-05-12 DIAGNOSIS — E1121 Type 2 diabetes mellitus with diabetic nephropathy: Secondary | ICD-10-CM

## 2023-05-12 DIAGNOSIS — C61 Malignant neoplasm of prostate: Secondary | ICD-10-CM | POA: Diagnosis not present

## 2023-05-12 DIAGNOSIS — N1831 Chronic kidney disease, stage 3a: Secondary | ICD-10-CM | POA: Diagnosis not present

## 2023-05-12 DIAGNOSIS — Z0000 Encounter for general adult medical examination without abnormal findings: Secondary | ICD-10-CM | POA: Diagnosis not present

## 2023-05-12 DIAGNOSIS — Z7984 Long term (current) use of oral hypoglycemic drugs: Secondary | ICD-10-CM

## 2023-05-12 DIAGNOSIS — E785 Hyperlipidemia, unspecified: Secondary | ICD-10-CM | POA: Diagnosis not present

## 2023-05-12 DIAGNOSIS — Z23 Encounter for immunization: Secondary | ICD-10-CM | POA: Diagnosis not present

## 2023-05-12 DIAGNOSIS — I152 Hypertension secondary to endocrine disorders: Secondary | ICD-10-CM

## 2023-05-12 DIAGNOSIS — E669 Obesity, unspecified: Secondary | ICD-10-CM

## 2023-05-12 MED ORDER — LISINOPRIL-HYDROCHLOROTHIAZIDE 20-12.5 MG PO TABS: 1.0000 | ORAL_TABLET | Freq: Every day | ORAL | 4 refills | Status: DC

## 2023-05-12 MED ORDER — METFORMIN HCL 500 MG PO TABS: 500.0000 mg | ORAL_TABLET | Freq: Two times a day (BID) | ORAL | 4 refills | Status: DC

## 2023-05-12 MED ORDER — AMLODIPINE BESYLATE 10 MG PO TABS: 10.0000 mg | ORAL_TABLET | Freq: Every day | ORAL | 4 refills | Status: DC

## 2023-05-12 MED ORDER — ROSUVASTATIN CALCIUM 20 MG PO TABS: 20.0000 mg | ORAL_TABLET | Freq: Every day | ORAL | 4 refills | Status: DC

## 2023-05-12 NOTE — Assessment & Plan Note (Signed)
Ongoing.  Continue to collaborate with urology, reviewed recent notes and labs.  He would like PSA checked today.

## 2023-05-12 NOTE — Assessment & Plan Note (Signed)
Recommend continued cessation.  Up To Date on AAA screening.

## 2023-05-12 NOTE — Progress Notes (Addendum)
BP 109/64   Pulse 69   Temp 98.1 F (36.7 C) (Oral)   Ht 5' 10.5" (1.791 m)   Wt 207 lb 12.8 oz (94.3 kg)   SpO2 96%   BMI 29.39 kg/m    Subjective:    Patient ID: Fernando Frederick, male    DOB: [birth year], 67 y.o.   MRN: [id number]  HPI: Fernando Frederick is a 67 y.o. male presenting on 05/12/2023 for comprehensive medical examination. Current medical complaints include:none  He currently lives with: wife Interim Problems from his last visit: no   DIABETES March A1c 6.1%. Continues on Metformin 500 MG BID.  He is a former smoker, quit >20 years ago.  Smoked for 40 years.  A pack of cigarettes last 2-3 days.   Hypoglycemic episodes:no Polydipsia/polyuria: no Visual disturbance: no Chest pain: no Paresthesias: no Glucose Monitoring: yes             Accucheck frequency: rarely             Fasting glucose: 101 yesterday             Post prandial:             Evening:             Before meals: Taking Insulin?: no             Long acting insulin:             Short acting insulin: Blood Pressure Monitoring: not checking Retinal Examination: Up To Date -- October 10th with Dr. Patsi Sears Exam: Up to Date Pneumovax: Up to Date Influenza: Up to Date Aspirin: yes    HYPERTENSION / HYPERLIPIDEMIA Current Amlodipine, Lisinopril-HCTZ and Crestor. Satisfied with current treatment? yes Duration of hypertension: chronic BP monitoring frequency: not checking BP range:  BP medication side effects: no Duration of hyperlipidemia: chronic Cholesterol medication side effects: no Cholesterol supplements: none Medication compliance: good compliance Aspirin: yes Recent stressors: no Recurrent headaches: no Visual changes: no Palpitations: no Dyspnea: no Chest pain: no Lower extremity edema: no Dizzy/lightheaded: no    CHRONIC KIDNEY DISEASE (CKD 3a) & PROSTATE CA Continues on Lisinopril for kidney protection.    Follows with urology for prostate CA with last visit 02/03/23 --  IMRT performed 03/19/23 to 04/25/23.  May PSA 6.0. CKD status: stable Medications renally dose: yes Previous renal evaluation: no Pneumovax:  Up to Date Influenza Vaccine: Up to Date  Functional Status Survey: Is the patient deaf or have difficulty hearing?: No Does the patient have difficulty seeing, even when wearing glasses/contacts?: No Does the patient have difficulty concentrating, remembering, or making decisions?: No Does the patient have difficulty walking or climbing stairs?: No Does the patient have difficulty dressing or bathing?: No Does the patient have difficulty doing errands alone such as visiting a doctor's office or shopping?: No  FALL RISK:    05/12/2023    8:18 AM 11/04/2022    9:37 AM 05/14/2022    1:43 PM 05/06/2022   10:17 AM 11/01/2021   10:10 AM  Fall Risk   Falls in the past year? 0 0 0 0 0  Number falls in past yr: 0 0 0 0 0  Injury with Fall? 0 0 0 0 0  Risk for fall due to : No Fall Risks No Fall Risks No Fall Risks No Fall Risks No Fall Risks  Follow up Falls evaluation completed Falls evaluation completed Falls evaluation completed Falls evaluation completed Falls evaluation  completed   Depression Screen    05/12/2023    8:18 AM 11/04/2022    9:37 AM 05/14/2022    1:42 PM 05/06/2022   10:17 AM 11/01/2021   10:10 AM  Depression screen PHQ 2/9  Decreased Interest 0 0 0 0 0  Down, Depressed, Hopeless 0 0 0 0 0  PHQ - 2 Score 0 0 0 0 0  Altered sleeping 0 0 0 0 0  Tired, decreased energy 0 0 0 0 1  Change in appetite 0 0 0 0 0  Feeling bad or failure about yourself  3 0 0 0 0  Trouble concentrating 0 0 0 0 0  Moving slowly or fidgety/restless 0 0 0 0 0  Suicidal thoughts 0 0 0 0 0  PHQ-9 Score 3 0 0 0 1  Difficult doing work/chores Somewhat difficult Not difficult at all Not difficult at all Not difficult at all       05/12/2023    8:18 AM 11/04/2022    9:37 AM 05/06/2022   10:18 AM 11/01/2021   10:10 AM  GAD 7 : Generalized Anxiety Score  Nervous,  Anxious, on Edge 0 0 0 0  Control/stop worrying 0 0 0 0  Worry too much - different things 0 0 0 0  Trouble relaxing 0 0 0 0  Restless 0 0 0 0  Easily annoyed or irritable 0 0 0 0  Afraid - awful might happen 0 0 0 0  Total GAD 7 Score 0 0 0 0  Anxiety Difficulty Not difficult at all Not difficult at all Not difficult at all Not difficult at all     Past Medical History:  Past Medical History:  Diagnosis Date   Benign hypertension with chronic kidney disease 04/24/2015   Chronic kidney disease    CKD (chronic kidney disease), stage III (HCC) 04/24/2015   Diabetes (HCC) 04/24/2015   Diabetes mellitus without complication (HCC)    Gout 04/24/2015   Hyperlipidemia    Hypertension     Surgical History:  Past Surgical History:  Procedure Laterality Date   COLONOSCOPY WITH PROPOFOL N/A 12/08/2020   Procedure: COLONOSCOPY WITH PROPOFOL;  Surgeon: Midge Minium, MD;  Location: [facility] ENDOSCOPY;  Service: Endoscopy;  Laterality: N/A;   COLONOSCOPY WITH PROPOFOL N/A 02/20/2021   Procedure: COLONOSCOPY WITH PROPOFOL;  Surgeon: Midge Minium, MD;  Location: [facility] ENDOSCOPY;  Service: Endoscopy;  Laterality: N/A;   COLONOSCOPY WITH PROPOFOL N/A 06/11/2022   Procedure: COLONOSCOPY WITH PROPOFOL;  Surgeon: Midge Minium, MD;  Location: [facility] ENDOSCOPY;  Service: Endoscopy;  Laterality: N/A;   partial thumb amputation     PROSTATE SURGERY     Prostate Biopsy    Medications:  Current Outpatient Medications on File Prior to Visit  Medication Sig   aspirin 81 MG tablet Take 81 mg by mouth daily.   Cholecalciferol (VITAMIN D-3) 1000 UNITS CAPS Take 1,000 Units by mouth daily.   cyanocobalamin 2000 MCG tablet Take 2,000 mcg by mouth daily. One tablet daily   finasteride (PROSCAR) 5 MG tablet Take 1 tablet (5 mg total) by mouth daily.   Multiple Vitamin (MULTIVITAMIN) tablet Take 1 tablet by mouth daily.   Omega-3 Fatty Acids (FISH OIL) 1000 MG CAPS Take 1,000 mg by mouth daily.   No current  facility-administered medications on file prior to visit.    Allergies:  No Known Allergies  Social History:  Social History   Socioeconomic History   Marital status: Married    Spouse  name: Not on file   Number of children: Not on file   Years of education: Not on file   Highest education level: Not on file  Occupational History   Not on file  Tobacco Use   Smoking status: Former    Current packs/day: 0.00    Types: Cigarettes    Quit date: 08/26/2004    Years since quitting: 18.7    Passive exposure: Past   Smokeless tobacco: Never  Vaping Use   Vaping status: Never Used  Substance and Sexual Activity   Alcohol use: Yes    Alcohol/week: 6.0 standard drinks of alcohol    Types: 6 Cans of beer per week    Comment: ocassionally    Drug use: No   Sexual activity: Yes  Other Topics Concern   Not on file  Social History Narrative   Not on file   Social Determinants of Health   Financial Resource Strain: Low Risk  (05/14/2022)   Overall Financial Resource Strain (CARDIA)    Difficulty of Paying Living Expenses: Not hard at all  Food Insecurity: No Food Insecurity (05/14/2022)   Hunger Vital Sign    Worried About Running Out of Food in the Last Year: Never true    Ran Out of Food in the Last Year: Never true  Transportation Needs: No Transportation Needs (05/14/2022)   PRAPARE - Administrator, Civil Service (Medical): No    Lack of Transportation (Non-Medical): No  Physical Activity: Insufficiently Active (05/14/2022)   Exercise Vital Sign    Days of Exercise per Week: 7 days    Minutes of Exercise per Session: 20 min  Stress: No Stress Concern Present (05/14/2022)   [employer] of Occupational Health - Occupational Stress Questionnaire    Feeling of Stress : Not at all  Social Connections: Moderately Integrated (05/14/2022)   Social Connection and Isolation Panel [NHANES]    Frequency of Communication with Friends and Family: More than three times a  week    Frequency of Social Gatherings with Friends and Family: Once a week    Attends Religious Services: More than 4 times per year    Active Member of [employer] or Organizations: No    Attends Banker Meetings: Never    Marital Status: Married  Catering manager Violence: Not At Risk (05/14/2022)   Humiliation, Afraid, Rape, and Kick questionnaire    Fear of Current or Ex-Partner: No    Emotionally Abused: No    Physically Abused: No    Sexually Abused: No   Social History   Tobacco Use  Smoking Status Former   Current packs/day: 0.00   Types: Cigarettes   Quit date: 08/26/2004   Years since quitting: 18.7   Passive exposure: Past  Smokeless Tobacco Never   Social History   Substance and Sexual Activity  Alcohol Use Yes   Alcohol/week: 6.0 standard drinks of alcohol   Types: 6 Cans of beer per week   Comment: ocassionally     Family History:  Family History  Problem Relation Age of Onset   Cancer Mother        lung   Hypertension Mother    Diabetes Father    Hypertension Father    Kidney disease Father    Hypertension Sister    Hypertension Sister    Hypertension Sister    Kidney disease Brother     Past medical history, surgical history, medications, allergies, family history and social history reviewed with patient  today and changes made to appropriate areas of the chart.   Review of Systems - negative All other ROS negative except what is listed above and in the HPI.      Objective:    BP 109/64   Pulse 69   Temp 98.1 F (36.7 C) (Oral)   Ht 5' 10.5" (1.791 m)   Wt 207 lb 12.8 oz (94.3 kg)   SpO2 96%   BMI 29.39 kg/m   Wt Readings from Last 3 Encounters:  05/12/23 207 lb 12.8 oz (94.3 kg)  02/03/23 210 lb 11.2 oz (95.6 kg)  02/03/23 210 lb (95.3 kg)    Physical Exam Vitals and nursing note reviewed.  Constitutional:      General: He is awake. He is not in acute distress.    Appearance: He is well-developed and well-groomed. He is  not ill-appearing or toxic-appearing.  HENT:     Head: Normocephalic and atraumatic.     Right Ear: Hearing, tympanic membrane, ear canal and external ear normal. No drainage.     Left Ear: Hearing, tympanic membrane, ear canal and external ear normal. No drainage.     Nose: Nose normal.     Mouth/Throat:     Pharynx: Uvula midline.  Eyes:     General: Lids are normal.        Right eye: No discharge.        Left eye: No discharge.     Extraocular Movements: Extraocular movements intact.     Conjunctiva/sclera: Conjunctivae normal.     Pupils: Pupils are equal, round, and reactive to light.     Visual Fields: Right eye visual fields normal and left eye visual fields normal.  Neck:     Thyroid: No thyromegaly.     Vascular: No carotid bruit or JVD.     Trachea: Trachea normal.  Cardiovascular:     Rate and Rhythm: Normal rate and regular rhythm.     Heart sounds: Normal heart sounds, S1 normal and S2 normal. No murmur heard.    No gallop.  Pulmonary:     Effort: Pulmonary effort is normal. No accessory muscle usage or respiratory distress.     Breath sounds: Normal breath sounds.  Abdominal:     General: Bowel sounds are normal.     Palpations: Abdomen is soft. There is no hepatomegaly or splenomegaly.     Tenderness: There is no abdominal tenderness.  Musculoskeletal:        General: Normal range of motion.     Cervical back: Normal range of motion and neck supple.     Right lower leg: No edema.     Left lower leg: No edema.  Lymphadenopathy:     Head:     Right side of head: No submental, submandibular, tonsillar, preauricular or posterior auricular adenopathy.     Left side of head: No submental, submandibular, tonsillar, preauricular or posterior auricular adenopathy.     Cervical: No cervical adenopathy.  Skin:    General: Skin is warm and dry.     Capillary Refill: Capillary refill takes less than 2 seconds.     Findings: No rash.  Neurological:     Mental Status: He  is alert and oriented to person, place, and time.     Gait: Gait is intact.     Deep Tendon Reflexes: Reflexes are normal and symmetric.     Reflex Scores:      Brachioradialis reflexes are 2+ on the right side and  2+ on the left side.      Patellar reflexes are 2+ on the right side and 2+ on the left side. Psychiatric:        Attention and Perception: Attention normal.        Mood and Affect: Mood normal.        Speech: Speech normal.        Behavior: Behavior normal. Behavior is cooperative.        Thought Content: Thought content normal.        Cognition and Memory: Cognition normal.        Judgment: Judgment normal.     Diabetic Foot Exam - Simple   Simple Foot Form Visual Inspection No deformities, no ulcerations, no other skin breakdown bilaterally: Yes Sensation Testing Intact to touch and monofilament testing bilaterally: Yes Pulse Check Posterior Tibialis and Dorsalis pulse intact bilaterally: Yes Comments     Results for orders placed or performed in visit on 05/05/23  CBC (Cancer Center Only)  Result Value Ref Range   WBC Count 4.0 4.0 - 10.5 K/uL   RBC 4.37 4.22 - 5.81 MIL/uL   Hemoglobin 11.7 (L) 13.0 - 17.0 g/dL   HCT 78.2 (L) 95.6 - 21.3 %   MCV 81.9 80.0 - 100.0 fL   MCH 26.8 26.0 - 34.0 pg   MCHC 32.7 30.0 - 36.0 g/dL   RDW 08.6 57.8 - 46.9 %   Platelet Count 235 150 - 400 K/uL   nRBC 0.0 0.0 - 0.2 %      Assessment & Plan:   Problem List Items Addressed This Visit       Cardiovascular and Mediastinum   Hypertension associated with diabetes (HCC)    Chronic, stable.  BP at goal in office today.  Recommend he check BP at least 3 days a week at home and document & focus on DASH diet. Continue current medication regimen and adjust as needed.  Urine ALB 03 November 2022, continue Lisinopril for kidney protection.  LABS: CBC, CMP, TSH.  Could consider discontinuation of HCTZ if kidney function remains lower side and increase Lisinopril as needed for BP.    Refills sent in.  Return in 6 months.      Relevant Medications   rosuvastatin (CRESTOR) 20 MG tablet   lisinopril-hydrochlorothiazide (ZESTORETIC) 20-12.5 MG tablet   metFORMIN (GLUCOPHAGE) 500 MG tablet   amLODipine (NORVASC) 10 MG tablet   Other Relevant Orders   Comprehensive metabolic panel   TSH   CBC with Differential/Platelet     Endocrine   Hyperlipidemia associated with type 2 diabetes mellitus (HCC)    Chronic, ongoing.  Continue current medication regimen and adjust as needed.  Lipid panel today.  Return in 6 months.      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  A1c 6.1% last visit, recheck today, and urine ALB 03 November 2022.  At this time continue Metformin 500 MG BID, had a trial lowering but sugars increased at home -- could consider discontinuation of this in future and changing to [place] for more kidney protection, discussed with him today.  Continue Lisinopril for kidney protection.  Recommend he continue to check  BS a few times a week at home.  Refills sent in.  Return in 6 months for follow-up.        Relevant Medications   rosuvastatin (CRESTOR) 20 MG tablet   lisinopril-hydrochlorothiazide (ZESTORETIC) 20-12.5 MG tablet   metFORMIN (GLUCOPHAGE) 500 MG tablet   Other Relevant Orders   HgB A1c     Genitourinary   CKD (chronic kidney disease), stage III (HCC)    Chronic, ongoing. Continue Lisinopril for kidney protection.  CMP today, urine ALB 10 in March 2024.  Consider nephrology referral if decline noted.  Could consider discontinuation of HCTZ if decline noted and then increase Lisinopril as needed for BP control.  Also could consider change  from Metformin to [place] in future.      Relevant Orders   Comprehensive metabolic panel   CBC with Differential/Platelet   PTH, intact and calcium   Prostate cancer (HCC)    Ongoing.  Continue to collaborate with urology, reviewed recent notes and labs.  He would like PSA checked today.      Relevant Orders   PSA     Other   History of smoking    Recommend continued cessation.  Up To Date on AAA screening.      Other Visit Diagnoses     Flu vaccine need       Flu vaccine in office today, educated patient.   Relevant Orders   Flu Vaccine Trivalent High Dose (Fluad) (Completed)   Encounter for annual physical exam       Annual physical today with labs and health maintenance reviewed, discussed with patient.        Discussed aspirin prophylaxis for myocardial infarction prevention and decision was made to continue ASA  LABORATORY TESTING:  Health maintenance labs ordered today as discussed above.   The natural history of prostate cancer and ongoing controversy regarding screening and potential treatment outcomes of prostate cancer has been discussed with the patient. The meaning of a false positive PSA and a false negative PSA has been discussed. He indicates understanding of the limitations of this screening test and wishes to proceed with screening PSA testing.  These are obtained with urology, however he wishes performed today due to completing treatment.   IMMUNIZATIONS:   - Tdap: Tetanus vaccination status reviewed: last tetanus booster within 10 years. - Influenza: Provided today - Pneumovax: Up To Date - Prevnar: Up To Date - Zostavax vaccine: Up To Date  SCREENING: - Colonoscopy: Up To Date Discussed with patient purpose of the colonoscopy is to detect colon cancer at curable precancerous or early stages   - AAA Screening: Up To Date was negative -Hearing Test: Not applicable  -Spirometry: Not applicable   PATIENT COUNSELING:    Sexuality: Discussed  sexually transmitted diseases, partner selection, use of condoms, avoidance of unintended pregnancy  and contraceptive alternatives.   Advised to avoid cigarette smoking.  I discussed with the patient that most people either abstain from alcohol or drink within safe limits (<=14/week and <=4 drinks/occasion for males, <=7/weeks and <= 3 drinks/occasion for females) and that the risk for alcohol disorders and other health effects rises proportionally with the number of drinks per week and how often a drinker exceeds daily limits.  Discussed cessation/primary prevention of drug use and availability of treatment for abuse.   Diet: Encouraged to adjust caloric intake to maintain  or achieve ideal body weight, to reduce intake of dietary saturated fat and total fat, to limit sodium intake by  avoiding high sodium foods and not adding table salt, and to maintain adequate dietary potassium and calcium preferably from fresh fruits, vegetables, and low-fat dairy products.    Stressed the importance of regular exercise  Injury prevention: Discussed safety belts, safety helmets, smoke detector, smoking near bedding or upholstery.   Dental health: Discussed importance of regular tooth brushing, flossing, and dental visits.   Follow up plan: NEXT PREVENTATIVE PHYSICAL DUE IN 1 YEAR. Return in about 6 months (around 11/09/2023) for T2DM, HTN/HLD, PROSTATE CA, OBESITY + MEDICARE WELL WITH NURSE AFTER 05/15/23.

## 2023-05-12 NOTE — Assessment & Plan Note (Signed)
Chronic, stable.  A1c 6.1% last visit, recheck today, and urine ALB 03 November 2022.  At this time continue Metformin 500 MG BID, had a trial lowering but sugars increased at home -- could consider discontinuation of this in future and changing to [place] for more kidney protection, discussed with him today.  Continue Lisinopril for kidney protection.  Recommend he continue to check BS a few times a week at home.  Refills sent in.  Return in 6 months for follow-up.

## 2023-05-12 NOTE — Assessment & Plan Note (Signed)
Chronic, ongoing. Continue Lisinopril for kidney protection.  CMP today, urine ALB 10 in March 2024.  Consider nephrology referral if decline noted.  Could consider discontinuation of HCTZ if decline noted and then increase Lisinopril as needed for BP control.  Also could consider change from Metformin to [place] in future.

## 2023-05-12 NOTE — Assessment & Plan Note (Signed)
Chronic, ongoing.  Continue current medication regimen and adjust as needed.  Lipid panel today.  Return in 6 months.

## 2023-05-12 NOTE — Assessment & Plan Note (Signed)
Chronic, stable.  BP at goal in office today.  Recommend he check BP at least 3 days a week at home and document & focus on DASH diet. Continue current medication regimen and adjust as needed.  Urine ALB 03 November 2022, continue Lisinopril for kidney protection.  LABS: CBC, CMP, TSH.  Could consider discontinuation of HCTZ if kidney function remains lower side and increase Lisinopril as needed for BP.   Refills sent in.  Return in 6 months.

## 2023-05-13 ENCOUNTER — Ambulatory Visit: Payer: Medicare HMO

## 2023-05-13 LAB — CBC WITH DIFFERENTIAL/PLATELET
Basophils Absolute: 0 10*3/uL (ref 0.0–0.2)
Basos: 0 %
EOS (ABSOLUTE): 0.2 10*3/uL (ref 0.0–0.4)
Eos: 6 %
Hematocrit: 37.7 % (ref 37.5–51.0)
Hemoglobin: 11.9 g/dL — ABNORMAL LOW (ref 13.0–17.7)
Immature Grans (Abs): 0 10*3/uL (ref 0.0–0.1)
Immature Granulocytes: 0 %
Lymphocytes Absolute: 1.2 10*3/uL (ref 0.7–3.1)
Lymphs: 33 %
MCH: 27.2 pg (ref 26.6–33.0)
MCHC: 31.6 g/dL (ref 31.5–35.7)
MCV: 86 fL (ref 79–97)
Monocytes Absolute: 0.4 10*3/uL (ref 0.1–0.9)
Monocytes: 11 %
Neutrophils Absolute: 1.8 10*3/uL (ref 1.4–7.0)
Neutrophils: 50 %
Platelets: 262 10*3/uL (ref 150–450)
RBC: 4.38 x10E6/uL (ref 4.14–5.80)
RDW: 14 % (ref 11.6–15.4)
WBC: 3.6 10*3/uL (ref 3.4–10.8)

## 2023-05-13 LAB — HEMOGLOBIN A1C
Est. average glucose Bld gHb Est-mCnc: 131 mg/dL
Hgb A1c MFr Bld: 6.2 % — ABNORMAL HIGH (ref 4.8–5.6)

## 2023-05-13 LAB — COMPREHENSIVE METABOLIC PANEL
ALT: 15 IU/L (ref 0–44)
AST: 17 IU/L (ref 0–40)
Albumin: 4.2 g/dL (ref 3.9–4.9)
Alkaline Phosphatase: 63 IU/L (ref 44–121)
BUN/Creatinine Ratio: 17 (ref 10–24)
BUN: 31 mg/dL — ABNORMAL HIGH (ref 8–27)
Bilirubin Total: 0.3 mg/dL (ref 0.0–1.2)
CO2: 21 mmol/L (ref 20–29)
Calcium: 9.9 mg/dL (ref 8.6–10.2)
Chloride: 102 mmol/L (ref 96–106)
Creatinine, Ser: 1.79 mg/dL — ABNORMAL HIGH (ref 0.76–1.27)
Globulin, Total: 2.7 g/dL (ref 1.5–4.5)
Glucose: 97 mg/dL (ref 70–99)
Potassium: 4.2 mmol/L (ref 3.5–5.2)
Sodium: 140 mmol/L (ref 134–144)
Total Protein: 6.9 g/dL (ref 6.0–8.5)
eGFR: 41 mL/min/{1.73_m2} — ABNORMAL LOW (ref >59)

## 2023-05-13 LAB — LIPID PANEL W/O CHOL/HDL RATIO
Cholesterol, Total: 150 mg/dL (ref 100–199)
HDL: 37 mg/dL — ABNORMAL LOW (ref >39)
LDL Chol Calc (NIH): 72 mg/dL (ref 0–99)
Triglycerides: 248 mg/dL — ABNORMAL HIGH (ref 0–149)
VLDL Cholesterol Cal: 41 mg/dL — ABNORMAL HIGH (ref 5–40)

## 2023-05-13 LAB — TSH: TSH: 1.18 u[IU]/mL (ref 0.450–4.500)

## 2023-05-13 LAB — PSA: Prostate Specific Ag, Serum: 0.2 ng/mL (ref 0.0–4.0)

## 2023-05-13 LAB — PTH, INTACT AND CALCIUM: PTH: 16 pg/mL (ref 15–65)

## 2023-05-13 NOTE — Progress Notes (Signed)
Contacted via MyChart   Good afternoon Fernando Frederick, your labs have returned: - Your kidney function, creatinine and eGFR, continues to show stage 3b Kidney disease with no worsening.  We will continue to monitor closely ad send you to kidney doctor as needed.  Ensure good water intake daily.  Liver function, AST and ALT, is normal. - Cholesterol levels show stable LDL, bad cholesterol, but triglycerides a little elevated still.  Please continue Rosuvastatin and we may increase to 40 MG next visit.  Also look into taking Omega 3 supplement. - CBC continues to show mildly low hemoglobin, but hematocrit is stable.  We will monitor closely. - A1c is 6.2%, great news!!  Continue Metformin. - Remainder of labs stable AND your PSA has come down from 6.0 to now 0.2!!  Great news!!  Any questions? Keep being amazing!!  Thank you for allowing me to participate in your care.  I appreciate you. Kindest regards, Raveen Wieseler

## 2023-05-14 ENCOUNTER — Ambulatory Visit: Payer: Medicare HMO

## 2023-05-19 ENCOUNTER — Ambulatory Visit: Payer: Medicare HMO | Admitting: Radiation Oncology

## 2023-05-26 ENCOUNTER — Ambulatory Visit
Admission: RE | Admit: 2023-05-26 | Discharge: 2023-05-26 | Disposition: A | Discharge status: Home / Self Care | Payer: Medicare HMO | Source: Ambulatory Visit | Attending: Radiation Oncology | Admitting: Radiation Oncology

## 2023-05-26 ENCOUNTER — Other Ambulatory Visit: Payer: Self-pay | Admitting: *Deleted

## 2023-05-26 ENCOUNTER — Encounter: Payer: Self-pay | Admitting: Radiation Oncology

## 2023-05-26 VITALS — BP 128/87 | HR 65 | Temp 98.6°F | Resp 20 | Wt 206.7 lb

## 2023-05-26 DIAGNOSIS — C61 Malignant neoplasm of prostate: Secondary | ICD-10-CM | POA: Diagnosis not present

## 2023-05-26 DIAGNOSIS — Z923 Personal history of irradiation: Secondary | ICD-10-CM | POA: Diagnosis not present

## 2023-05-26 NOTE — Progress Notes (Signed)
Radiation Oncology Follow up Note  Name: Fernando Frederick   Date:   05/26/2023 MRN:  [id number] DOB: 11-08-1955    This 67 y.o. male presents to the clinic today for 1 month follow-up status post image guided IMRT radiation therapy for Gleason 6 adenocarcinoma the prostate presenting with a PSA in the 6 range.  REFERRING PROVIDER: Marjie Skiff, NP  HPI: Patient is a 67 year old male now out 1 month having completed image guided IMRT radiation therapy to his prostate for Gleason 6 adenocarcinoma.  Seen today in routine follow-up he is doing well.  He specifically denies any increased lower urinary tract symptoms diarrhea.  He is noticed he has been working outside International aid/development worker work that he is sometimes becomes a little lightheaded.  He is on hypertensive medication I have asked him to see his GP about that..  COMPLICATIONS OF TREATMENT: none  FOLLOW UP COMPLIANCE: keeps appointments   PHYSICAL EXAM:  BP 128/87   Pulse 65   Temp 98.6 F (37 C)   Resp 20   Wt 206 lb 11.2 oz (93.8 kg)   SpO2 100%   BMI 29.24 kg/m  Well-developed well-nourished patient in NAD. HEENT reveals PERLA, EOMI, discs not visualized.  Oral cavity is clear. No oral mucosal lesions are identified. Neck is clear without evidence of cervical or supraclavicular adenopathy. Lungs are clear to A&P. Cardiac examination is essentially unremarkable with regular rate and rhythm without murmur rub or thrill. Abdomen is benign with no organomegaly or masses noted. Motor sensory and DTR levels are equal and symmetric in the upper and lower extremities. Cranial nerves II through XII are grossly intact. Proprioception is intact. No peripheral adenopathy or edema is identified. No motor or sensory levels are noted. Crude visual fields are within normal range.  RADIOLOGY RESULTS: No current films for review  PLAN: Present time patient is doing well with very low side effect profile from his radiation therapy.  I have asked him  to see his GP about his complaints of some lightheadedness which may be hypotensive episodes related to his blood pressure medicine.  I have also explained of asked to see him back in 3 months with a follow-up PSA at that time.  Patient knows to call with any concerns at any time.  I would like to take this opportunity to thank you for allowing me to participate in the care of your patient.Carmina Miller, MD

## 2023-06-05 DIAGNOSIS — E113393 Type 2 diabetes mellitus with moderate nonproliferative diabetic retinopathy without macular edema, bilateral: Secondary | ICD-10-CM | POA: Diagnosis not present

## 2023-06-10 ENCOUNTER — Ambulatory Visit: Payer: Medicare HMO | Admitting: Emergency Medicine

## 2023-06-10 VITALS — Ht 70.5 in | Wt 208.0 lb

## 2023-06-10 DIAGNOSIS — Z1211 Encounter for screening for malignant neoplasm of colon: Secondary | ICD-10-CM

## 2023-06-10 DIAGNOSIS — Z0000 Encounter for general adult medical examination without abnormal findings: Secondary | ICD-10-CM

## 2023-06-10 NOTE — Progress Notes (Signed)
Subjective:   Fernando Frederick is a 67 y.o. male who presents for Medicare Annual/Subsequent preventive examination.  Visit Complete: Virtual I connected with  Lilli Light on 06/10/23 by a audio enabled telemedicine application and verified that I am speaking with the correct person using two identifiers.  Patient Location: Home  Provider Location: Office/Clinic  I discussed the limitations of evaluation and management by telemedicine. The patient expressed understanding and agreed to proceed.  Vital Signs: Because this visit was a virtual/telehealth visit, some criteria may be missing or patient reported. Any vitals not documented were not able to be obtained and vitals that have been documented are patient reported.   Cardiac Risk Factors include: advanced age (>72men, >97 women);male gender;diabetes mellitus;dyslipidemia;hypertension     Objective:    Today's Vitals   06/10/23 1124  Weight: 208 lb (94.3 kg)  Height: 5' 10.5" (1.791 m)   Body mass index is 29.42 kg/m.     06/10/2023   11:37 AM 05/26/2023    8:58 AM 02/03/2023    9:01 AM 06/11/2022    9:47 AM 02/20/2021    7:08 AM 12/08/2020    7:15 AM  Advanced Directives  Does Patient Have a Medical Advance Directive? Yes No No No No No  Type of Estate agent of [place];Living will       Does patient want to make changes to medical advance directive? No - Patient declined       Copy of Healthcare Power of Attorney in Chart? No - copy requested       Would patient like information on creating a medical advance directive?  No - Patient declined No - Patient declined  No - Patient declined     Current Medications (verified) Outpatient Encounter Medications as of 06/10/2023  Medication Sig   amLODipine (NORVASC) 10 MG tablet Take 1 tablet (10 mg total) by mouth daily.   aspirin 81 MG tablet Take 81 mg by mouth daily.   Calcium-Magnesium-Vitamin D (CALCIUM 1200+D3 PO) Take 1 tablet by mouth daily.    Cholecalciferol (VITAMIN D-3) 1000 UNITS CAPS Take 1,000 Units by mouth daily.   cyanocobalamin 2000 MCG tablet Take 2,000 mcg by mouth daily. One tablet daily   finasteride (PROSCAR) 5 MG tablet Take 1 tablet (5 mg total) by mouth daily.   lisinopril-hydrochlorothiazide (ZESTORETIC) 20-12.5 MG tablet Take 1 tablet by mouth daily.   metFORMIN (GLUCOPHAGE) 500 MG tablet Take 1 tablet (500 mg total) by mouth 2 (two) times daily with a meal.   Multiple Vitamin (MULTIVITAMIN) tablet Take 1 tablet by mouth daily.   Omega-3 Fatty Acids (FISH OIL) 1000 MG CAPS Take 1,000 mg by mouth daily.   rosuvastatin (CRESTOR) 20 MG tablet Take 1 tablet (20 mg total) by mouth daily.   No facility-administered encounter medications on file as of 06/10/2023.    Allergies (verified) Patient has no known allergies.   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   Chronic kidney disease    CKD (chronic kidney disease), stage III (HCC) 04/24/2015   Diabetes (HCC) 04/24/2015   Diabetes mellitus without complication (HCC)    Gout 04/24/2015   Hyperlipidemia    Hypertension    Past Surgical History:  Procedure Laterality Date   COLONOSCOPY WITH PROPOFOL N/A 12/08/2020   Procedure: COLONOSCOPY WITH PROPOFOL;  Surgeon: Midge Minium, MD;  Location: [facility] ENDOSCOPY;  Service: Endoscopy;  Laterality: N/A;   COLONOSCOPY WITH PROPOFOL  N/A 02/20/2021   Procedure: COLONOSCOPY WITH PROPOFOL;  Surgeon: Midge Minium, MD;  Location: [facility] ENDOSCOPY;  Service: Endoscopy;  Laterality: N/A;   COLONOSCOPY WITH PROPOFOL N/A 06/11/2022   Procedure: COLONOSCOPY WITH PROPOFOL;  Surgeon: Midge Minium, MD;  Location: [facility] ENDOSCOPY;  Service: Endoscopy;  Laterality: N/A;   partial thumb amputation     PROSTATE SURGERY     Prostate Biopsy   Family History  Problem Relation Age of Onset   Cancer Mother        lung   Hypertension Mother    Diabetes Father     Hypertension Father    Kidney disease Father    Hypertension Sister    Hypertension Sister    Hypertension Sister    Kidney disease Brother    Social History   Socioeconomic History   Marital status: Married    Spouse name: Liborio Nixon   Number of children: 3   Years of education: Not on file   Highest education level: Not on file  Occupational History   Occupation: retired  Tobacco Use   Smoking status: Former    Current packs/day: 0.00    Average packs/day: 0.3 packs/day for 40.0 years (10.0 ttl pk-yrs)    Types: Cigarettes    Start date: 1966    Quit date: 08/26/2004    Years since quitting: 18.8    Passive exposure: Past   Smokeless tobacco: Never  Vaping Use   Vaping status: Never Used  Substance and Sexual Activity   Alcohol use: Yes    Alcohol/week: 6.0 standard drinks of alcohol    Types: 6 Cans of beer per week    Comment: drinks 2 beers 3 days per week   Drug use: No   Sexual activity: Yes  Other Topics Concern   Not on file  Social History Narrative   Married, 3 sons-2 living and 1 deceased.   Social Determinants of Health   Financial Resource Strain: Low Risk  (06/10/2023)   Overall Financial Resource Strain (CARDIA)    Difficulty of Paying Living Expenses: Not hard at all  Food Insecurity: No Food Insecurity (06/10/2023)   Hunger Vital Sign    Worried About Running Out of Food in the Last Year: Never true    Ran Out of Food in the Last Year: Never true  Transportation Needs: No Transportation Needs (06/10/2023)   PRAPARE - Administrator, Civil Service (Medical): No    Lack of Transportation (Non-Medical): No  Physical Activity: Insufficiently Active (06/10/2023)   Exercise Vital Sign    Days of Exercise per Week: 3 days    Minutes of Exercise per Session: 30 min  Stress: No Stress Concern Present (06/10/2023)   [employer] of Occupational Health - Occupational Stress Questionnaire    Feeling of Stress : Not at all  Social  Connections: Moderately Integrated (06/10/2023)   Social Connection and Isolation Panel [NHANES]    Frequency of Communication with Friends and Family: More than three times a week    Frequency of Social Gatherings with Friends and Family: More than three times a week    Attends Religious Services: More than 4 times per year    Active Member of [employer] or Organizations: No    Attends Banker Meetings: Never    Marital Status: Married    Tobacco Counseling Counseling given: Not Answered   Clinical Intake:  Pre-visit preparation completed: Yes  Pain : No/denies pain     BMI - recorded:  29.42 Nutritional Status: BMI 25 -29 Overweight Nutritional Risks: None Diabetes: Yes CBG done?: No (FBS 103 per patient) Did pt. bring in CBG monitor from home?: No  How often do you need to have someone help you when you read instructions, pamphlets, or other written materials from your doctor or pharmacy?: 1 - Never  Interpreter Needed?: No  Information entered by :: Tora Kindred, CMA   Activities of Daily Living    06/10/2023   11:27 AM 05/12/2023    8:15 AM  In your present state of health, do you have any difficulty performing the following activities:  Hearing? 0 0  Vision? 0 0  Difficulty concentrating or making decisions? 0 0  Walking or climbing stairs? 0 0  Dressing or bathing? 0 0  Doing errands, shopping? 0 0  Preparing Food and eating ? N   Using the Toilet? N   In the past six months, have you accidently leaked urine? Y   Comment prostate cancer   Do you have problems with loss of bowel control? N   Managing your Medications? N   Managing your Finances? N   Housekeeping or managing your Housekeeping? N     Patient Care Team: Marjie Skiff, NP as PCP - General (Nurse Practitioner) Carmina Miller, MD as Consulting Physician (Radiation Oncology) [facility] (Ophthalmology)  Indicate any recent Medical Services you may have received  from other than [facility] providers in the past year (date may be approximate).     Assessment:   This is a routine wellness examination for [place].  Hearing/Vision screen Hearing Screening - Comments:: Denies hearing loss Vision Screening - Comments:: Gets routine eye exams   Goals Addressed             This Visit's Progress    Patient Stated       Continue to drink water and eat healthy      Depression Screen    06/10/2023   11:35 AM 05/12/2023    8:18 AM 11/04/2022    9:37 AM 05/14/2022    1:42 PM 05/06/2022   10:17 AM 11/01/2021   10:10 AM 05/12/2021   11:14 AM  PHQ 2/9 Scores  PHQ - 2 Score 0 0 0 0 0 0 0  PHQ- 9 Score 0 3 0 0 0 1 1    Fall Risk    06/10/2023   11:39 AM 05/12/2023    8:18 AM 11/04/2022    9:37 AM 05/14/2022    1:43 PM 05/06/2022   10:17 AM  Fall Risk   Falls in the past year? 0 0 0 0 0  Number falls in past yr: 0 0 0 0 0  Injury with Fall? 0 0 0 0 0  Risk for fall due to : No Fall Risks No Fall Risks No Fall Risks No Fall Risks No Fall Risks  Follow up Falls prevention discussed Falls evaluation completed Falls evaluation completed Falls evaluation completed Falls evaluation completed    MEDICARE RISK AT HOME: Medicare Risk at Home Any stairs in or around the home?: Yes If so, are there any without handrails?: No Home free of loose throw rugs in walkways, pet beds, electrical cords, etc?: Yes Adequate lighting in your home to reduce risk of falls?: Yes Life alert?: No Use of a cane, walker or w/c?: No Grab bars in the bathroom?: No Shower chair or bench in shower?: Yes Elevated toilet seat or a handicapped toilet?: Yes  TIMED UP AND  GO:  Was the test performed?  No    Cognitive Function:        06/10/2023   11:41 AM 05/14/2022    1:44 PM 05/12/2021   11:10 AM  6CIT Screen  What Year? 0 points 0 points 0 points  What month? 0 points 0 points 0 points  What time? 0 points 0 points 0 points  Count back from 20 0 points 0 points 0 points   Months in reverse 0 points 0 points 0 points  Repeat phrase 0 points 0 points 0 points  Total Score 0 points 0 points 0 points    Immunizations Immunization History  Administered Date(s) Administered   Fluad Quad(high Dose 65+) 05/03/2020, 05/16/2022   Fluad Trivalent(High Dose 65+) 05/12/2023   Influenza,inj,Quad PF,6+ Mos 05/14/2019   Influenza-Unspecified 05/27/2015, 05/27/2016   Moderna Sars-Covid-2 Vaccination 01/11/2020, 02/08/2020, 08/15/2020   Pneumococcal Conjugate-13 05/03/2020   Pneumococcal Polysaccharide-23 01/11/2013, 05/04/2021   Td 03/11/2012, 11/04/2022   Tdap 04/07/2012   Zoster Recombinant(Shingrix) 08/07/2022, 12/02/2022    TDAP status: Up to date  Flu Vaccine status: Up to date  Pneumococcal vaccine status: Up to date  Covid-19 vaccine status: Declined, Education has been provided regarding the importance of this vaccine but patient still declined. Advised may receive this vaccine at local pharmacy or Health Dept.or vaccine clinic. Aware to provide a copy of the vaccination record if obtained from local pharmacy or Health Dept. Verbalized acceptance and understanding.  Qualifies for Shingles Vaccine? Yes   Zostavax completed No   Shingrix Completed?: Yes  Screening Tests Health Maintenance  Topic Date Due   OPHTHALMOLOGY EXAM  08/08/2021   COVID-19 Vaccine (4 - 2023-24 season) 04/27/2023   Colonoscopy  06/12/2023   Diabetic kidney evaluation - Urine ACR  11/04/2023   HEMOGLOBIN A1C  11/09/2023   Diabetic kidney evaluation - eGFR measurement  05/11/2024   FOOT EXAM  05/11/2024   Medicare Annual Wellness (AWV)  06/09/2024   DTaP/Tdap/Td (4 - Td or Tdap) 11/03/2032   Pneumonia Vaccine 73+ Years old  Completed   INFLUENZA VACCINE  Completed   Hepatitis C Screening  Completed   Zoster Vaccines- Shingrix  Completed   HPV VACCINES  Aged Out    Health Maintenance  Health Maintenance Due  Topic Date Due   OPHTHALMOLOGY EXAM  08/08/2021   COVID-19  Vaccine (4 - 2023-24 season) 04/27/2023   Colonoscopy  06/12/2023    Colorectal cancer screening: Type of screening: Colonoscopy. Completed 06/11/22. Repeat every 1 years. Placed a referral to GI.  Lung Cancer Screening: (Low Dose CT Chest recommended if Age 48-80 years, 20 pack-year currently smoking OR have quit w/in 15years.) does not qualify.   Lung Cancer Screening Referral: n/a  Additional Screening:  Hepatitis C Screening: does not qualify; Completed 04/23/16  Vision Screening: Recommended annual ophthalmology exams for early detection of glaucoma and other disorders of the eye. Is the patient up to date with their annual eye exam?  Yes , requested last eye exam from 06/05/23 Who is the provider or what is the name of the office in which the patient attends annual eye exams? Dr. Alvester Morin If pt is not established with a provider, would they like to be referred to a provider to establish care? No .   Dental Screening: Recommended annual dental exams for proper oral hygiene  Diabetic Foot Exam: Diabetic Foot Exam: Completed 05/12/23  Community Resource Referral / Chronic Care Management: CRR required this visit?  No   CCM  required this visit?  No     Plan:     I have personally reviewed and noted the following in the patient's chart:   Medical and social history Use of alcohol, tobacco or illicit drugs  Current medications and supplements including opioid prescriptions. Patient is not currently taking opioid prescriptions. Functional ability and status Nutritional status Physical activity Advanced directives List of other physicians Hospitalizations, surgeries, and ER visits in previous 12 months Vitals Screenings to include cognitive, depression, and falls Referrals and appointments  In addition, I have reviewed and discussed with patient certain preventive protocols, quality metrics, and best practice recommendations. A written personalized care plan for preventive  services as well as general preventive health recommendations were provided to patient.     Tora Kindred, CMA   06/10/2023   After Visit Summary: (MyChart) Due to this being a telephonic visit, the after visit summary with patients personalized plan was offered to patient via MyChart   Nurse Notes:  Declined DM & Nutrition Education Denied covid vaccine Requested last eye exam from Dr. Alvester Morin (done 05/2023) Placed referral to GI for colonoscopy

## 2023-06-10 NOTE — Patient Instructions (Addendum)
Fernando Frederick , Thank you for taking time to come for your Medicare Wellness Visit. I appreciate your ongoing commitment to your health goals. Please review the following plan we discussed and let me know if I can assist you in the future.   Referrals/Orders/Follow-Ups/Clinician Recommendations: I have placed a referral to GI/Dr. Servando Snare for a colonoscopy. Someone from his office should call you to schedule an appointment.  This is a list of the screening recommended for you and due dates:  Health Maintenance  Topic Date Due   Eye exam for diabetics  08/08/2021   COVID-19 Vaccine (4 - 2023-24 season) 04/27/2023   Colon Cancer Screening  06/12/2023   Yearly kidney health urinalysis for diabetes  11/04/2023   Hemoglobin A1C  11/09/2023   Yearly kidney function blood test for diabetes  05/11/2024   Complete foot exam   05/11/2024   Medicare Annual Wellness Visit  06/09/2024   DTaP/Tdap/Td vaccine (4 - Td or Tdap) 11/03/2032   Pneumonia Vaccine  Completed   Flu Shot  Completed   Hepatitis C Screening  Completed   Zoster (Shingles) Vaccine  Completed   HPV Vaccine  Aged Out    Advanced directives: (Copy Requested) Please bring a copy of your health care power of attorney and living will to the office to be added to your chart at your convenience.  Next Medicare Annual Wellness Visit scheduled for next year: Yes, 06/15/24 @ 10:40am

## 2023-08-12 ENCOUNTER — Encounter: Payer: Self-pay | Admitting: Nurse Practitioner

## 2023-08-12 NOTE — Telephone Encounter (Signed)
 Care team updated and letter sent for eye exam notes.

## 2023-08-18 ENCOUNTER — Inpatient Hospital Stay: Payer: Medicare HMO | Attending: Radiation Oncology

## 2023-08-18 DIAGNOSIS — C61 Malignant neoplasm of prostate: Secondary | ICD-10-CM | POA: Diagnosis not present

## 2023-08-18 LAB — CBC (CANCER CENTER ONLY)
HCT: 37 % — ABNORMAL LOW (ref 39.0–52.0)
Hemoglobin: 12.5 g/dL — ABNORMAL LOW (ref 13.0–17.0)
MCH: 27.8 pg (ref 26.0–34.0)
MCHC: 33.8 g/dL (ref 30.0–36.0)
MCV: 82.2 fL (ref 80.0–100.0)
Platelet Count: 256 10*3/uL (ref 150–400)
RBC: 4.5 MIL/uL (ref 4.22–5.81)
RDW: 12.4 % (ref 11.5–15.5)
WBC Count: 4.9 10*3/uL (ref 4.0–10.5)
nRBC: 0 % (ref 0.0–0.2)

## 2023-08-18 LAB — PSA: Prostatic Specific Antigen: 0.05 ng/mL (ref 0.00–4.00)

## 2023-08-25 ENCOUNTER — Ambulatory Visit
Admission: RE | Admit: 2023-08-25 | Discharge: 2023-08-25 | Disposition: A | Discharge status: Home / Self Care | Payer: Medicare HMO | Source: Ambulatory Visit | Attending: Radiation Oncology | Admitting: Radiation Oncology

## 2023-08-25 ENCOUNTER — Other Ambulatory Visit: Payer: Self-pay | Admitting: *Deleted

## 2023-08-25 ENCOUNTER — Encounter: Payer: Self-pay | Admitting: Radiation Oncology

## 2023-08-25 VITALS — BP 121/77 | HR 76 | Temp 97.7°F | Resp 16 | Wt 211.0 lb

## 2023-08-25 DIAGNOSIS — C61 Malignant neoplasm of prostate: Secondary | ICD-10-CM

## 2023-08-25 DIAGNOSIS — R351 Nocturia: Secondary | ICD-10-CM | POA: Insufficient documentation

## 2023-08-25 DIAGNOSIS — Z191 Hormone sensitive malignancy status: Secondary | ICD-10-CM | POA: Diagnosis not present

## 2023-08-25 DIAGNOSIS — Z923 Personal history of irradiation: Secondary | ICD-10-CM | POA: Diagnosis not present

## 2023-08-25 NOTE — Progress Notes (Signed)
Radiation Oncology Follow up Note  Name: Fernando Frederick   Date:   08/25/2023 MRN:  [id number] DOB: [birth year]    This 67 y.o. male presents to the clinic today for 33-month follow-up status post IMRT radiation therapy to his prostate for Gleason 6 adenocarcinoma presenting with a PSA in the 6 range.  REFERRING PROVIDER: Marjie Skiff, NP  HPI: Patient is a 67 year old male now out for months having completed image guided IMRT radiation therapy for Gleason 6 adenocarcinoma the prostate.  Seen today in routine follow-up he is doing well he has nocturia x 2-3.  No significant bowel problems.  He had a piece PSA recently which was 0.05.  COMPLICATIONS OF TREATMENT: none  FOLLOW UP COMPLIANCE: keeps appointments   PHYSICAL EXAM:  BP 121/77   Pulse 76   Temp 97.7 F (36.5 C)   Resp 16   Wt 211 lb (95.7 kg)   BMI 29.85 kg/m  Well-developed well-nourished patient in NAD. HEENT reveals PERLA, EOMI, discs not visualized.  Oral cavity is clear. No oral mucosal lesions are identified. Neck is clear without evidence of cervical or supraclavicular adenopathy. Lungs are clear to A&P. Cardiac examination is essentially unremarkable with regular rate and rhythm without murmur rub or thrill. Abdomen is benign with no organomegaly or masses noted. Motor sensory and DTR levels are equal and symmetric in the upper and lower extremities. Cranial nerves II through XII are grossly intact. Proprioception is intact. No peripheral adenopathy or edema is identified. No motor or sensory levels are noted. Crude visual fields are within normal range.  RADIOLOGY RESULTS: No current films for review  PLAN: Present time patient is doing well under excellent biochemical control of his prostate cancer.  Of asked to see him back in 6 months for follow-up.  He continues follow-up care with urology.  Patient is to call with any concerns.  I would like to take this opportunity to thank you for allowing me to participate  in the care of your patient.Carmina Miller, MD

## 2023-09-03 ENCOUNTER — Ambulatory Visit: Payer: Medicare HMO | Admitting: Urology

## 2023-09-03 VITALS — BP 129/82 | HR 74 | Ht 70.0 in | Wt 210.0 lb

## 2023-09-03 DIAGNOSIS — R351 Nocturia: Secondary | ICD-10-CM | POA: Diagnosis not present

## 2023-09-03 DIAGNOSIS — Z8546 Personal history of malignant neoplasm of prostate: Secondary | ICD-10-CM | POA: Diagnosis not present

## 2023-09-03 DIAGNOSIS — C61 Malignant neoplasm of prostate: Secondary | ICD-10-CM

## 2023-09-03 NOTE — Progress Notes (Signed)
 LILLETTE Venetia PEDLAR Plume,acting as a scribe for Rosina Riis, MD.,have documented all relevant documentation on the behalf of Rosina Riis, MD,as directed by  Rosina Riis, MD while in the presence of Rosina Riis, MD.  09/03/23 11:22 AM   Fernando Frederick 01-07-1956 [id number]  Referring provider: Valerio Melanie DASEN, NP [address] [place],  [place] [address]  Chief Complaint  Patient presents with   Prostate Cancer    HPI: 68 year-old male with a personal history of prostate cancer who returns today for follow up.   He underwent prostate biopsy on 09/06/2020.  This revealed Gleason 3+3 involving 4 of 12 cores up to 100% of the tissue, at the left mid and apex.   Preprocedure PSA 11/21 was 3.2 on finasteride .TRUS volume 46.25 g.   He underwent prostate MRI to serve as a baseline which shows a PI-RADS 4, nearly 5 lesion at the left posterior lateral peripheral zone in the mid gland measuring 1.4 cm in long axis.  This abutted but did not spread beyond the capsule.  There is no evidence of pelvic lymphadenopathy or metastasis.   He underwent a repeat fusion biopsy on 10/03/2021 surgical pathology was consistent with Gleason 3+3 involving 3 cores affecting up to 50% and Gleason 3+4 involving 1 core affecting 10 %.   At the time of his diagnosis, his PSA was 5.5.    Ultimately, he elected to undergo IMRT with ADT. He received a single 6 month depo in 01/2023 and underwent fiducial marker placement followed by IMRT completed on 04/25/2023.   His most recent PSA on 08/18/2023 was 0.05.   Today, he reports experiencing urinary frequency, particularly at night, getting up three to four times.  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   Chronic kidney disease    CKD (chronic kidney disease), stage III (HCC) 04/24/2015   Diabetes (HCC) 04/24/2015   Diabetes mellitus without complication (HCC)    Gout 04/24/2015   Hyperlipidemia     Hypertension     Surgical History: Past Surgical History:  Procedure Laterality Date   COLONOSCOPY WITH PROPOFOL  N/A 12/08/2020   Procedure: COLONOSCOPY WITH PROPOFOL ;  Surgeon: Jinny Carmine, MD;  Location: [facility] ENDOSCOPY;  Service: Endoscopy;  Laterality: N/A;   COLONOSCOPY WITH PROPOFOL  N/A 02/20/2021   Procedure: COLONOSCOPY WITH PROPOFOL ;  Surgeon: Jinny Carmine, MD;  Location: [facility] ENDOSCOPY;  Service: Endoscopy;  Laterality: N/A;   COLONOSCOPY WITH PROPOFOL  N/A 06/11/2022   Procedure: COLONOSCOPY WITH PROPOFOL ;  Surgeon: Jinny Carmine, MD;  Location: [facility] ENDOSCOPY;  Service: Endoscopy;  Laterality: N/A;   partial thumb amputation     PROSTATE SURGERY     Prostate Biopsy    Home Medications:  Allergies as of 09/03/2023   No Known Allergies      Medication List        Accurate as of September 03, 2023 11:22 AM. If you have any questions, ask your nurse or doctor.          STOP taking these medications    finasteride  5 MG tablet Commonly known as: PROSCAR        TAKE these medications    amLODipine  10 MG tablet Commonly known as: NORVASC  Take 1 tablet (10 mg total) by mouth daily.   aspirin 81 MG tablet Take 81 mg by mouth daily.   CALCIUM  1200+D3 PO Take 1 tablet by mouth daily.   cyanocobalamin 2000 MCG tablet Take 2,000 mcg by  mouth daily. One tablet daily   Fish Oil 1000 MG Caps Take 1,000 mg by mouth daily.   lisinopril -hydrochlorothiazide  20-12.5 MG tablet Commonly known as: ZESTORETIC  Take 1 tablet by mouth daily.   metFORMIN  500 MG tablet Commonly known as: GLUCOPHAGE  Take 1 tablet (500 mg total) by mouth 2 (two) times daily with a meal.   multivitamin tablet Take 1 tablet by mouth daily.   rosuvastatin  20 MG tablet Commonly known as: CRESTOR  Take 1 tablet (20 mg total) by mouth daily.   Vitamin D -3 25 MCG (1000 UT) Caps Take 1,000 Units by mouth daily.         Family History: Family History  Problem Relation Age of Onset    Cancer Mother        lung   Hypertension Mother    Diabetes Father    Hypertension Father    Kidney disease Father    Hypertension Sister    Hypertension Sister    Hypertension Sister    Kidney disease Brother     Social History:  reports that he quit smoking about 19 years ago. His smoking use included cigarettes. He started smoking about 59 years ago. He has a 10 pack-year smoking history. He has been exposed to tobacco smoke. He has never used smokeless tobacco. He reports current alcohol use of about 6.0 standard drinks of alcohol per week. He reports that he does not use drugs.   Physical Exam: BP 129/82   Pulse 74   Ht 5' 10 (1.778 m)   Wt 210 lb (95.3 kg)   BMI 30.13 kg/m   Constitutional:  Alert and oriented, No acute distress. HEENT: [place] AT, moist mucus membranes.  Trachea midline, no masses. Neurologic: Grossly intact, no focal deficits, moving all 4 extremities. Psychiatric: Normal mood and affect.    Assessment & Plan:    1. Prostate cancer - Status post IMRT and 6 months of ADT completed in 2024 - His PSA is low and we will continue to check it on a Q6 month basis, possibly alternating with radiation oncology - Discontinue calcium  supplementation as hormone therapy is completed. Continue vitamin D  as needed  2. Nocturia - Discontinue finasteride . Monitor for any worsening of urinary symptoms.  - Discuss potential sleep apnea with primary care provider due to nocturnal symptoms and dry mouth upon waking.  Return in about 6 months (around 03/02/2024) for repeat PSA.  I have reviewed the above documentation for accuracy and completeness, and I agree with the above.   Rosina Riis, MD   [facility] [address] [place], [place] [address] [phone]

## 2023-11-08 DIAGNOSIS — E119 Type 2 diabetes mellitus without complications: Secondary | ICD-10-CM | POA: Insufficient documentation

## 2023-11-08 NOTE — Patient Instructions (Signed)
 Be Involved in Caring For Your Health:  Taking Medications When medications are taken as directed, they can greatly improve your health. But if they are not taken as prescribed, they may not work. In some cases, not taking them correctly can be harmful. To help ensure your treatment remains effective and safe, understand your medications and how to take them. Bring your medications to each visit for review by your provider.  Your lab results, notes, and after visit summary will be available on My Chart. We strongly encourage you to use this feature. If lab results are abnormal the clinic will contact you with the appropriate steps. If the clinic does not contact you assume the results are satisfactory. You can always view your results on My Chart. If you have questions regarding your health or results, please contact the clinic during office hours. You can also ask questions on My Chart.  We at [facility] are grateful that you chose [place] to provide your care. We strive to provide evidence-based and compassionate care and are always looking for feedback. If you get a survey from the clinic please complete this so we can hear your opinions.  Diabetes Mellitus and Foot Care Diabetes, also called diabetes mellitus, may cause problems with your feet and legs because of poor blood flow (circulation). Poor circulation may make your skin: Become thinner and drier. Break more easily. Heal more slowly. Peel and crack. You may also have nerve damage (neuropathy). This can cause decreased feeling in your legs and feet. This means that you may not notice minor injuries to your feet that could lead to more serious problems. Finding and treating problems early is the best way to prevent future foot problems. How to care for your feet Foot hygiene  Wash your feet daily with warm water and mild soap. Do not use hot water. Then, pat your feet and the areas between your toes until they are fully dry. Do  not soak your feet. This can dry your skin. Trim your toenails straight across. Do not dig under them or around the cuticle. File the edges of your nails with an emery board or nail file. Apply a moisturizing lotion or petroleum jelly to the skin on your feet and to dry, brittle toenails. Use lotion that does not contain alcohol and is unscented. Do not apply lotion between your toes. Shoes and socks Wear clean socks or stockings every day. Make sure they are not too tight. Do not wear knee-high stockings. These may decrease blood flow to your legs. Wear shoes that fit well and have enough cushioning. Always look in your shoes before you put them on to be sure there are no objects inside. To break in new shoes, wear them for just a few hours a day. This prevents injuries on your feet. Wounds, scrapes, corns, and calluses  Check your feet daily for blisters, cuts, bruises, sores, and redness. If you cannot see the bottom of your feet, use a mirror or ask someone for help. Do not cut off corns or calluses or try to remove them with medicine. If you find a minor scrape, cut, or break in the skin on your feet, keep it and the skin around it clean and dry. You may clean these areas with mild soap and water. Do not clean the area with peroxide, alcohol, or iodine. If you have a wound, scrape, corn, or callus on your foot, look at it several times a day to make sure it  is healing and not infected. Check for: Redness, swelling, or pain. Fluid or blood. Warmth. Pus or a bad smell. General tips Do not cross your legs. This may decrease blood flow to your feet. Do not use heating pads or hot water bottles on your feet. They may burn your skin. If you have lost feeling in your feet or legs, you may not know this is happening until it is too late. Protect your feet from hot and cold by wearing shoes, such as at the beach or on hot pavement. Schedule a complete foot exam at least once a year or more often if  you have foot problems. Report any cuts, sores, or bruises to your health care provider right away. Where to find more information American Diabetes Association: diabetes.org Association of Diabetes Care & Education Specialists: diabeteseducator.org Contact a health care provider if: You have a condition that increases your risk of infection, and you have any cuts, sores, or bruises on your feet. You have an injury that is not healing. You have redness on your legs or feet. You feel burning or tingling in your legs or feet. You have pain or cramps in your legs and feet. Your legs or feet are numb. Your feet always feel cold. You have pain around any toenails. Get help right away if: You have a wound, scrape, corn, or callus on your foot and: You have signs of infection. You have a fever. You have a red line going up your leg. This information is not intended to replace advice given to you by your health care provider. Make sure you discuss any questions you have with your health care provider. Document Revised: 02/13/2022 Document Reviewed: 02/13/2022 Elsevier Patient Education  2024 [employer].

## 2023-11-10 ENCOUNTER — Ambulatory Visit (INDEPENDENT_AMBULATORY_CARE_PROVIDER_SITE_OTHER): Payer: Medicare HMO | Admitting: Nurse Practitioner

## 2023-11-10 ENCOUNTER — Encounter: Payer: Self-pay | Admitting: Nurse Practitioner

## 2023-11-10 VITALS — BP 124/71 | HR 73 | Temp 98.2°F | Ht 70.0 in | Wt 210.8 lb

## 2023-11-10 DIAGNOSIS — E119 Type 2 diabetes mellitus without complications: Secondary | ICD-10-CM | POA: Diagnosis not present

## 2023-11-10 DIAGNOSIS — E785 Hyperlipidemia, unspecified: Secondary | ICD-10-CM

## 2023-11-10 DIAGNOSIS — I152 Hypertension secondary to endocrine disorders: Secondary | ICD-10-CM

## 2023-11-10 DIAGNOSIS — E1169 Type 2 diabetes mellitus with other specified complication: Secondary | ICD-10-CM | POA: Diagnosis not present

## 2023-11-10 DIAGNOSIS — C61 Malignant neoplasm of prostate: Secondary | ICD-10-CM

## 2023-11-10 DIAGNOSIS — E1159 Type 2 diabetes mellitus with other circulatory complications: Secondary | ICD-10-CM

## 2023-11-10 DIAGNOSIS — N1831 Chronic kidney disease, stage 3a: Secondary | ICD-10-CM

## 2023-11-10 DIAGNOSIS — E1121 Type 2 diabetes mellitus with diabetic nephropathy: Secondary | ICD-10-CM

## 2023-11-10 DIAGNOSIS — Z87891 Personal history of nicotine dependence: Secondary | ICD-10-CM

## 2023-11-10 DIAGNOSIS — Z7984 Long term (current) use of oral hypoglycemic drugs: Secondary | ICD-10-CM

## 2023-11-10 LAB — BAYER DCA HB A1C WAIVED: HB A1C (BAYER DCA - WAIVED): 6.2 % — ABNORMAL HIGH (ref 4.8–5.6)

## 2023-11-10 LAB — MICROALBUMIN, URINE WAIVED
Creatinine, Urine Waived: 300 mg/dL (ref 10–300)
Microalb, Ur Waived: 80 mg/L — ABNORMAL HIGH (ref 0–19)

## 2023-11-10 MED ORDER — ROSUVASTATIN CALCIUM 40 MG PO TABS: 40.0000 mg | ORAL_TABLET | Freq: Every day | ORAL | 3 refills | Status: DC

## 2023-11-10 NOTE — Assessment & Plan Note (Signed)
 Chronic, ongoing. Will increase Crestor to 40 mg daily. Labs: Lipid panel. Continue fish oil supplement. Return in 6 months.

## 2023-11-10 NOTE — Assessment & Plan Note (Signed)
 Chronic, stable. A1c 6.2% today. Labs: urine ALB. Continue Metformin 500 mg BID. Recommend monitoring blood sugars at home and documenting. Continue Lisinopril for kidney protection. Return in 6 months.

## 2023-11-10 NOTE — Assessment & Plan Note (Signed)
 Chronic, ongoing. Continue current medication regimen. Labs: CMP, urine ALB. Continue Lisinopril for kidney protection. Follow up in 6 months.

## 2023-11-10 NOTE — Assessment & Plan Note (Signed)
 Recommend continued cessation. Stated at age 68 and quit at about age 44.

## 2023-11-10 NOTE — Assessment & Plan Note (Signed)
 Chronic, stable. Blood pressure reading within goal. Recommend monitoring blood pressure at home and documenting. Continue to focus on DASH diet and current medication regimen. Will adjust as needed. Labs: CMP. Refills sent to pharmacy of choice. Return in 6 months.

## 2023-11-10 NOTE — Assessment & Plan Note (Signed)
 Ongoing. Will check PSA today. Continue to collaborate with urology. He will follow up with urology concerning intermittent rectal bleeding. Follow up in 6 months.

## 2023-11-10 NOTE — Assessment & Plan Note (Signed)
 Chronic, stable. A1c 6.2% today. Labs: urine ALB. Continue Metformin 500 mg BID. Recommend monitoring blood sugars at home and documenting. Return in 6 months.

## 2023-11-10 NOTE — Progress Notes (Deleted)
 BP 124/71   Pulse 73   Temp 98.2 F (36.8 C) (Oral)   Ht 5\' 10"  (1.778 m)   Wt 210 lb 12.8 oz (95.6 kg)   SpO2 98%   BMI 30.25 kg/m    Subjective:    Patient ID: Fernando Frederick, male    DOB: Oct 23, 1955, 68 y.o.   MRN: [id number]  HPI: Fernando Frederick is a 68 y.o. male  Chief Complaint  Patient presents with   Diabetes    No recent eye exam per patient    Hyperlipidemia   Hypertension   DIABETES Last A1c 6.2%.  Taking Metformin. Hypoglycemic episodes:{Blank single:19197::"yes","no"} Polydipsia/polyuria: {Blank single:19197::"yes","no"} Visual disturbance: {Blank single:19197::"yes","no"} Chest pain: {Blank single:19197::"yes","no"} Paresthesias: {Blank single:19197::"yes","no"} Glucose Monitoring: {Blank single:19197::"yes","no"}  Accucheck frequency: {Blank single:19197::"Not Checking","Daily","BID","TID"}  Fasting glucose:  Post prandial:  Evening:  Before meals: Taking Insulin?: {Blank single:19197::"yes","no"}  Long acting insulin:  Short acting insulin: Blood Pressure Monitoring: {Blank single:19197::"not checking","rarely","daily","weekly","monthly","a few times a day","a few times a week","a few times a month"} Retinal Examination: {Blank single:19197::"Up to Date","Not up to Date"} Foot Exam: {Blank single:19197::"Up to Date","Not up to Date"} Diabetic Education: {Blank single:19197::"Completed","Not Completed"} Pneumovax: {Blank single:19197::"Up to Date","Not up to Date","unknown"} Influenza: {Blank single:19197::"Up to Date","Not up to Date","unknown"} Aspirin: {Blank single:19197::"yes","no"}   HYPERTENSION / HYPERLIPIDEMIA Taking Zestoretic, Amlodipine, and Crestor. Satisfied with current treatment? {Blank single:19197::"yes","no"} Duration of hypertension: {Blank single:19197::"chronic","months","years"} BP monitoring frequency: {Blank single:19197::"not checking","rarely","daily","weekly","monthly","a few times a day","a few times a week","a few  times a month"} BP range:  BP medication side effects: {Blank single:19197::"yes","no"} Duration of hyperlipidemia: {Blank single:19197::"chronic","months","years"} Cholesterol medication side effects: {Blank single:19197::"yes","no"} Cholesterol supplements: {Blank multiple:19196::"none","fish oil","niacin","red yeast rice"} Medication compliance: {Blank single:19197::"excellent compliance","good compliance","fair compliance","poor compliance"} Aspirin: {Blank single:19197::"yes","no"} Recent stressors: {Blank single:19197::"yes","no"} Recurrent headaches: {Blank single:19197::"yes","no"} Visual changes: {Blank single:19197::"yes","no"} Palpitations: {Blank single:19197::"yes","no"} Dyspnea: {Blank single:19197::"yes","no"} Chest pain: {Blank single:19197::"yes","no"} Lower extremity edema: {Blank single:19197::"yes","no"} Dizzy/lightheaded: {Blank single:19197::"yes","no"}   CHRONIC KIDNEY DISEASE & PROSTATE CANCER Last saw urology on 09/03/23.  History of IMRT.  Has history of smoking, quit years ago. CKD status: {Blank single:19197::"controlled","uncontrolled","better","worse","exacerbated","stable"} Medications renally dose: {Blank single:19197::"yes","no"} Previous renal evaluation: {Blank single:19197::"yes","no"} Pneumovax:  {Blank single:19197::"Up to Date","Not up to Date","unknown"} Influenza Vaccine:  {Blank single:19197::"Up to Date","Not up to Date","unknown"}   Relevant past medical, surgical, family and social history reviewed and updated as indicated. Interim medical history since our last visit reviewed. Allergies and medications reviewed and updated.  Review of Systems  Per HPI unless specifically indicated above     Objective:    BP 124/71   Pulse 73   Temp 98.2 F (36.8 C) (Oral)   Ht 5\' 10"  (1.778 m)   Wt 210 lb 12.8 oz (95.6 kg)   SpO2 98%   BMI 30.25 kg/m   Wt Readings from Last 3 Encounters:  11/10/23 210 lb 12.8 oz (95.6 kg)  09/03/23 210 lb (95.3  kg)  08/25/23 211 lb (95.7 kg)    Physical Exam  Results for orders placed or performed in visit on 08/18/23  PSA   Collection Time: 08/18/23  9:52 AM  Result Value Ref Range   Prostatic Specific Antigen 0.05 0.00 - 4.00 ng/mL  CBC (Cancer Center Only)   Collection Time: 08/18/23  9:53 AM  Result Value Ref Range   WBC Count 4.9 4.0 - 10.5 K/uL   RBC 4.50 4.22 - 5.81 MIL/uL   Hemoglobin 12.5 (L) 13.0 - 17.0 g/dL   HCT 40.9 (L) 81.1 - 91.4 %   MCV 82.2 80.0 -  100.0 fL   MCH 27.8 26.0 - 34.0 pg   MCHC 33.8 30.0 - 36.0 g/dL   RDW 29.5 62.1 - 30.8 %   Platelet Count 256 150 - 400 K/uL   nRBC 0.0 0.0 - 0.2 %      Assessment & Plan:   Problem List Items Addressed This Visit       Cardiovascular and Mediastinum   Hypertension associated with diabetes (HCC)   Relevant Orders   Bayer DCA Hb A1c Waived   Microalbumin, Urine Waived   Comprehensive metabolic panel     Endocrine   Diabetes mellitus treated with oral medication (HCC)   Relevant Orders   Bayer DCA Hb A1c Waived   Hyperlipidemia associated with type 2 diabetes mellitus (HCC)   Relevant Orders   Bayer DCA Hb A1c Waived   Comprehensive metabolic panel   Lipid Panel w/o Chol/HDL Ratio   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

## 2023-11-10 NOTE — Progress Notes (Signed)
 BP 124/71   Pulse 73   Temp 98.2 F (36.8 C) (Oral)   Ht 5\' 10"  (1.778 m)   Wt 210 lb 12.8 oz (95.6 kg)   SpO2 98%   BMI 30.25 kg/m    Subjective:    Patient ID: Fernando Frederick, male    DOB: [birth year], 68 y.o.   MRN: [id number]  HPI: Fernando Frederick is a 68 y.o. male. Having some blood in stool. Will follow up with Urology. Concerned with Triglycerides being elevated.  Chief Complaint  Patient presents with   Diabetes    No recent eye exam per patient    Hyperlipidemia   Hypertension   NOTE WRITTEN BY DNP STUDENT.  ASSESSMENT AND PLAN OF CARE REVIEWED WITH STUDENT, AGREE WITH ABOVE FINDINGS AND PLAN.   DIABETES Last A1c 6.2%.  Taking Metformin. Hypoglycemic episodes:no Polydipsia/polyuria: yes 3x nightly Visual disturbance: no Chest pain: no Paresthesias: no Glucose Monitoring: no  Accucheck frequency:  rarely  Fasting glucose:  Post prandial:  Evening:  Before meals: Taking Insulin?: no  Long acting insulin:  Short acting insulin: Blood Pressure Monitoring: not checking Retinal Examination: Not up to Date Foot Exam: Not up to Date Diabetic Education: Completed Pneumovax: Up to Date Influenza: Not up to Date Aspirin: yes   HYPERTENSION / HYPERLIPIDEMIA Taking Zestoretic, Amlodipine, and Crestor. Satisfied with current treatment? yes Duration of hypertension: chronic BP monitoring frequency: not checking BP range:  BP medication side effects: no Duration of hyperlipidemia: chronic Cholesterol medication side effects: no Cholesterol supplements: fish oil Medication compliance: good compliance Aspirin: yes Recent stressors: no Recurrent headaches: no Visual changes: no Palpitations: no Dyspnea: no Chest pain: no Lower extremity edema: no Dizzy/lightheaded: no   CHRONIC KIDNEY DISEASE & PROSTATE CANCER Last saw urology on 09/03/23.  History of IMRT.  Has history of smoking, quit years ago. CKD status: controlled Medications renally dose:  yes Previous renal evaluation: no Pneumovax:  Up to Date Influenza Vaccine:  Not up to Date   Relevant past medical, surgical, family and social history reviewed and updated as indicated. Interim medical history since our last visit reviewed. Allergies and medications reviewed and updated.  Review of Systems  Constitutional:  Negative for activity change, diaphoresis, fatigue and fever.  Respiratory:  Negative for cough, chest tightness, shortness of breath and wheezing.   Cardiovascular:  Negative for chest pain, palpitations and leg swelling.  Gastrointestinal:  Negative for abdominal distention, abdominal pain, constipation, diarrhea, nausea and vomiting.  Endocrine: Negative for cold intolerance, heat intolerance, polydipsia, polyphagia and polyuria.  Musculoskeletal: Negative.   Skin: Negative.   Neurological:  Negative for dizziness, syncope, weakness, light-headedness, numbness and headaches.  Psychiatric/Behavioral: Negative.      Per HPI unless specifically indicated above     Objective:    BP 124/71   Pulse 73   Temp 98.2 F (36.8 C) (Oral)   Ht 5\' 10"  (1.778 m)   Wt 210 lb 12.8 oz (95.6 kg)   SpO2 98%   BMI 30.25 kg/m   Wt Readings from Last 3 Encounters:  11/10/23 210 lb 12.8 oz (95.6 kg)  09/03/23 210 lb (95.3 kg)  08/25/23 211 lb (95.7 kg)    Physical Exam Vitals and nursing note reviewed.  Constitutional:      General: He is not in acute distress.    Appearance: Normal appearance. He is well-developed, well-groomed and normal weight. He is not ill-appearing.  Neck:     Thyroid: No thyromegaly or thyroid tenderness.  Vascular: No carotid bruit.     Trachea: Trachea normal.  Cardiovascular:     Rate and Rhythm: Normal rate and regular rhythm.     Heart sounds: Normal heart sounds. No murmur heard. Pulmonary:     Effort: Pulmonary effort is normal. No respiratory distress.     Breath sounds: Normal breath sounds. No wheezing.  Abdominal:      General: Bowel sounds are normal. There is no distension.     Palpations: Abdomen is soft.     Tenderness: There is no abdominal tenderness.  Musculoskeletal:        General: Normal range of motion.     Cervical back: Normal range of motion and neck supple. No tenderness.     Right lower leg: No edema.     Left lower leg: No edema.  Lymphadenopathy:     Head:     Right side of head: No tonsillar adenopathy.     Left side of head: No tonsillar adenopathy.     Cervical:     Right cervical: No superficial cervical adenopathy.    Left cervical: No superficial cervical adenopathy.     Upper Body:     Right upper body: No supraclavicular adenopathy.     Left upper body: No supraclavicular adenopathy.  Skin:    General: Skin is warm and dry.  Neurological:     General: No focal deficit present.     Mental Status: He is alert and oriented to person, place, and time. Mental status is at baseline.     Deep Tendon Reflexes: Reflexes are normal and symmetric.  Psychiatric:        Attention and Perception: Attention and perception normal.        Mood and Affect: Mood and affect normal.        Speech: Speech normal.        Behavior: Behavior normal. Behavior is cooperative.        Thought Content: Thought content normal.        Cognition and Memory: Cognition and memory normal.        Judgment: Judgment normal.     Results for orders placed or performed in visit on 11/10/23  Bayer DCA Hb A1c Waived   Collection Time: 11/10/23  8:27 AM  Result Value Ref Range   HB A1C (BAYER DCA - WAIVED) 6.2 (H) 4.8 - 5.6 %  Microalbumin, Urine Waived   Collection Time: 11/10/23  8:27 AM  Result Value Ref Range   Microalb, Ur Waived 80 (H) 0 - 19 mg/L   Creatinine, Urine Waived 300 10 - 300 mg/dL   Microalb/Creat Ratio 30-300 (H) <30 mg/g      Assessment & Plan:   Problem List Items Addressed This Visit       Cardiovascular and Mediastinum   Hypertension associated with diabetes (HCC)    Chronic, stable. Blood pressure reading within goal. Recommend monitoring blood pressure at home and documenting. Continue to focus on DASH diet and current medication regimen. Will adjust as needed. Labs: CMP. Refills sent to pharmacy of choice. Return in 6 months.      Relevant Medications   rosuvastatin (CRESTOR) 40 MG tablet   Other Relevant Orders   Bayer DCA Hb A1c Waived (Completed)   Microalbumin, Urine Waived (Completed)   Comprehensive metabolic panel     Endocrine   Diabetes mellitus treated with oral medication (HCC)   Chronic, stable. A1c 6.2% today. Labs: urine ALB. Continue Metformin 500 mg  BID. Recommend monitoring blood sugars at home and documenting. Return in 6 months.       Relevant Medications   rosuvastatin (CRESTOR) 40 MG tablet   Other Relevant Orders   Bayer DCA Hb A1c Waived (Completed)   Hyperlipidemia associated with type 2 diabetes mellitus (HCC)   Chronic, ongoing. Will increase Crestor to 40 mg daily. Labs: Lipid panel. Continue fish oil supplement. Return in 6 months.      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 A1c 6.2% today. Labs: urine ALB. Continue Metformin 500 mg BID. Recommend monitoring blood sugars at home and documenting. Continue Lisinopril for kidney protection. Return in 6 months.      Relevant Medications   rosuvastatin (CRESTOR) 40 MG tablet   Other Relevant Orders   Bayer DCA Hb A1c Waived (Completed)   Microalbumin, Urine Waived (Completed)     Genitourinary   CKD (chronic kidney disease), stage III (HCC)   Chronic, ongoing. Continue current medication regimen. Labs: CMP, urine ALB. Continue Lisinopril for kidney protection. Follow up in 6 months.      Relevant Orders   Microalbumin, Urine Waived (Completed)    Comprehensive metabolic panel   Prostate cancer (HCC)   Ongoing. Will check PSA today. Continue to collaborate with urology. He will follow up with urology concerning intermittent rectal bleeding. Follow up in 6 months.      Relevant Orders   PSA     Other   History of smoking   Recommend continued cessation. Stated at age 74 and quit at about age 64.        Follow up plan: Return in about 6 months (around 05/12/2024) for Annual Physical after 05/11/24.

## 2023-11-11 ENCOUNTER — Encounter: Payer: Self-pay | Admitting: Nurse Practitioner

## 2023-11-11 LAB — COMPREHENSIVE METABOLIC PANEL
ALT: 16 IU/L (ref 0–44)
AST: 19 IU/L (ref 0–40)
Albumin: 4.5 g/dL (ref 3.9–4.9)
Alkaline Phosphatase: 78 IU/L (ref 44–121)
BUN/Creatinine Ratio: 13 (ref 10–24)
BUN: 22 mg/dL (ref 8–27)
Bilirubin Total: 0.3 mg/dL (ref 0.0–1.2)
CO2: 22 mmol/L (ref 20–29)
Calcium: 9.5 mg/dL (ref 8.6–10.2)
Chloride: 106 mmol/L (ref 96–106)
Creatinine, Ser: 1.72 mg/dL — ABNORMAL HIGH (ref 0.76–1.27)
Globulin, Total: 2.6 g/dL (ref 1.5–4.5)
Glucose: 111 mg/dL — ABNORMAL HIGH (ref 70–99)
Potassium: 4.4 mmol/L (ref 3.5–5.2)
Sodium: 143 mmol/L (ref 134–144)
Total Protein: 7.1 g/dL (ref 6.0–8.5)
eGFR: 42 mL/min/{1.73_m2} — ABNORMAL LOW (ref >59)

## 2023-11-11 LAB — LIPID PANEL W/O CHOL/HDL RATIO
Cholesterol, Total: 156 mg/dL (ref 100–199)
HDL: 42 mg/dL (ref >39)
LDL Chol Calc (NIH): 74 mg/dL (ref 0–99)
Triglycerides: 245 mg/dL — ABNORMAL HIGH (ref 0–149)
VLDL Cholesterol Cal: 40 mg/dL (ref 5–40)

## 2023-11-11 LAB — PSA: Prostate Specific Ag, Serum: <0.1 ng/mL (ref 0.0–4.0)

## 2023-11-11 NOTE — Progress Notes (Signed)
 Contacted via MyChart   Good morning [place], your labs have returned: - Kidney function, eGFR, and creatinine, is showing some ongoing stable kidney disease Stage 3b.  Liver function, AST and ALT, is normal. - Lipid panel continues to show elevations in triglycerides but remainder is stable.  Continue statin as we discussed yesterday. - PSA is normal.  Great news!! Any questions? Keep being stellar!!  Thank you for allowing me to participate in your care.  I appreciate you. Kindest regards, Renise Gillies

## 2024-02-16 ENCOUNTER — Inpatient Hospital Stay: Payer: Medicare HMO | Attending: Radiation Oncology

## 2024-02-16 DIAGNOSIS — C61 Malignant neoplasm of prostate: Secondary | ICD-10-CM | POA: Diagnosis present

## 2024-02-16 LAB — PSA: Prostatic Specific Antigen: 0.1 ng/mL (ref 0.00–4.00)

## 2024-02-23 ENCOUNTER — Ambulatory Visit
Admission: RE | Admit: 2024-02-23 | Discharge: 2024-02-23 | Disposition: A | Discharge status: Home / Self Care | Payer: Medicare HMO | Source: Ambulatory Visit | Attending: Radiation Oncology | Admitting: Radiation Oncology

## 2024-02-23 ENCOUNTER — Encounter: Payer: Self-pay | Admitting: Radiation Oncology

## 2024-02-23 VITALS — BP 97/60 | HR 76 | Temp 98.6°F | Resp 19 | Wt 206.4 lb

## 2024-02-23 DIAGNOSIS — C61 Malignant neoplasm of prostate: Secondary | ICD-10-CM

## 2024-02-23 NOTE — Progress Notes (Signed)
 Radiation Oncology Follow up Note  Name: Fernando Frederick   Date:   02/23/2024 MRN:  [id number] DOB: 23-Oct-1955    This 68 y.o. 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  REFERRING PROVIDER: Valerio Melanie DASEN, NP  HPI: Patient is a 68 year old male now out 10 months having completed image guided IMRT radiation therapy for Gleason 6 adenocarcinoma the prostate seen today in routine follow-up he is doing well.  He specifically denies any increased lower Neri tract symptoms diarrhea or fatigue he has been seeing some bright red blood per rectum.  Also has nocturia x 3.  He is on daily aspirin when asked him to discontinue that for the next several weeks to see if that improves.  His most recent hematocrit is 37.0 although this is 35 months old.  His most recent PSA is 0.1 showing excellent biochemical control of his prostate cancer  COMPLICATIONS OF TREATMENT: none  FOLLOW UP COMPLIANCE: keeps appointments   PHYSICAL EXAM:  BP 97/60   Pulse 76   Temp 98.6 F (37 C)   Resp 19   Wt 206 lb 6.4 oz (93.6 kg)   SpO2 99%   BMI 29.62 kg/m  Well-developed well-nourished patient in NAD. HEENT reveals PERLA, EOMI, discs not visualized.  Oral cavity is clear. No oral mucosal lesions are identified. Neck is clear without evidence of cervical or supraclavicular adenopathy. Lungs are clear to A&P. Cardiac examination is essentially unremarkable with regular rate and rhythm without murmur rub or thrill. Abdomen is benign with no organomegaly or masses noted. Motor sensory and DTR levels are equal and symmetric in the upper and lower extremities. Cranial nerves II through XII are grossly intact. Proprioception is intact. No peripheral adenopathy or edema is identified. No motor or sensory levels are noted. Crude visual fields are within normal range.  RADIOLOGY RESULTS: No current  films for review  PLAN: Present time patient is doing well under excellent biochemical control of his prostate cancer.  Pleased with his overall progress.  Of asked him to discontinue aspirin for about 2 weeks see if that improves with most likely external hemorrhoid bleeding.  He is being followed by his GM P for that.  I have asked to see him back in 6 months for follow-up with repeat PSA.  Patient knows to call with any concerns.  I would like to take this opportunity to thank you for allowing me to participate in the care of your patient.SABRA Marcey Penton, MD

## 2024-04-20 ENCOUNTER — Encounter: Payer: Self-pay | Admitting: Urology

## 2024-04-28 ENCOUNTER — Ambulatory Visit: Admitting: Nurse Practitioner

## 2024-05-04 DIAGNOSIS — H2511 Age-related nuclear cataract, right eye: Secondary | ICD-10-CM | POA: Diagnosis not present

## 2024-05-04 DIAGNOSIS — H25043 Posterior subcapsular polar age-related cataract, bilateral: Secondary | ICD-10-CM | POA: Diagnosis not present

## 2024-05-04 DIAGNOSIS — H25013 Cortical age-related cataract, bilateral: Secondary | ICD-10-CM | POA: Diagnosis not present

## 2024-05-04 DIAGNOSIS — H18413 Arcus senilis, bilateral: Secondary | ICD-10-CM | POA: Diagnosis not present

## 2024-05-04 DIAGNOSIS — H2513 Age-related nuclear cataract, bilateral: Secondary | ICD-10-CM | POA: Diagnosis not present

## 2024-06-06 NOTE — Patient Instructions (Signed)

## 2024-06-10 ENCOUNTER — Ambulatory Visit (INDEPENDENT_AMBULATORY_CARE_PROVIDER_SITE_OTHER): Admitting: Nurse Practitioner

## 2024-06-10 ENCOUNTER — Ambulatory Visit: Attending: Nurse Practitioner

## 2024-06-10 ENCOUNTER — Encounter: Payer: Self-pay | Admitting: Nurse Practitioner

## 2024-06-10 VITALS — BP 124/75 | HR 69 | Temp 98.4°F | Resp 15 | Ht 70.0 in | Wt 213.4 lb

## 2024-06-10 DIAGNOSIS — I152 Hypertension secondary to endocrine disorders: Secondary | ICD-10-CM

## 2024-06-10 DIAGNOSIS — E785 Hyperlipidemia, unspecified: Secondary | ICD-10-CM | POA: Diagnosis not present

## 2024-06-10 DIAGNOSIS — R002 Palpitations: Secondary | ICD-10-CM

## 2024-06-10 DIAGNOSIS — N1831 Chronic kidney disease, stage 3a: Secondary | ICD-10-CM | POA: Diagnosis not present

## 2024-06-10 DIAGNOSIS — E1159 Type 2 diabetes mellitus with other circulatory complications: Secondary | ICD-10-CM

## 2024-06-10 DIAGNOSIS — E119 Type 2 diabetes mellitus without complications: Secondary | ICD-10-CM | POA: Diagnosis not present

## 2024-06-10 DIAGNOSIS — C61 Malignant neoplasm of prostate: Secondary | ICD-10-CM | POA: Diagnosis not present

## 2024-06-10 DIAGNOSIS — Z87891 Personal history of nicotine dependence: Secondary | ICD-10-CM

## 2024-06-10 DIAGNOSIS — Z23 Encounter for immunization: Secondary | ICD-10-CM

## 2024-06-10 DIAGNOSIS — Z7984 Long term (current) use of oral hypoglycemic drugs: Secondary | ICD-10-CM

## 2024-06-10 DIAGNOSIS — E1169 Type 2 diabetes mellitus with other specified complication: Secondary | ICD-10-CM | POA: Diagnosis not present

## 2024-06-10 DIAGNOSIS — Z0000 Encounter for general adult medical examination without abnormal findings: Secondary | ICD-10-CM

## 2024-06-10 DIAGNOSIS — E1121 Type 2 diabetes mellitus with diabetic nephropathy: Secondary | ICD-10-CM | POA: Diagnosis not present

## 2024-06-10 LAB — BAYER DCA HB A1C WAIVED: HB A1C (BAYER DCA - WAIVED): 5.7 % — ABNORMAL HIGH (ref 4.8–5.6)

## 2024-06-10 MED ORDER — LISINOPRIL-HYDROCHLOROTHIAZIDE 20-12.5 MG PO TABS
1.0000 | ORAL_TABLET | Freq: Every day | ORAL | 4 refills | Status: AC
Start: 2024-06-10 — End: ?

## 2024-06-10 MED ORDER — ROSUVASTATIN CALCIUM 40 MG PO TABS: 40.0000 mg | ORAL_TABLET | Freq: Every day | ORAL | 3 refills | Status: AC

## 2024-06-10 MED ORDER — METFORMIN HCL 500 MG PO TABS: 500.0000 mg | ORAL_TABLET | Freq: Two times a day (BID) | ORAL | 4 refills | Status: AC

## 2024-06-10 MED ORDER — AMLODIPINE BESYLATE 10 MG PO TABS: 10.0000 mg | ORAL_TABLET | Freq: Every day | ORAL | 4 refills | Status: DC

## 2024-06-10 NOTE — Assessment & Plan Note (Signed)
 Chronic, ongoing. Continue Lisinopril  for kidney protection.  CMP today, urine ALB 80 March 2025.  Consider nephrology referral if decline noted.  Could consider discontinuation of HCTZ if decline noted and then increase Lisinopril  as needed for BP control.  Also could consider change from Metformin  to Farxiga in future.

## 2024-06-10 NOTE — Assessment & Plan Note (Signed)
Recommend continued cessation.  Up To Date on AAA screening.

## 2024-06-10 NOTE — Assessment & Plan Note (Signed)
 Refer to diabetes with nephropathy plan of care.

## 2024-06-10 NOTE — Assessment & Plan Note (Signed)
 Chronic, stable.  A1c 5.7% today and urine ALB 80 March 2025.  At this time continue Metformin  500 MG BID, had a trial lowering in past but sugars increased at home -- could consider discontinuation of this in future and changing to Farxiga for more kidney protection, discussed with him today.  Continue Lisinopril  for kidney protection.  Recommend he continue to check BS a few times a week at home.  Refills sent in.  Return in 6 months for follow-up.

## 2024-06-10 NOTE — Assessment & Plan Note (Signed)
Chronic, ongoing.  Continue current medication regimen and adjust as needed.  Lipid panel today.  Return in 6 months. 

## 2024-06-10 NOTE — Assessment & Plan Note (Signed)
 Started in August on and off.  Had episode of dizziness in August and SOB. EKG in office today reassuring. Continue all current medications.  Order Zio and Echo to further assess.  Referral to cardiology placed.

## 2024-06-10 NOTE — Assessment & Plan Note (Signed)
Chronic, stable.  BP at goal in office today.  Recommend he check BP at least 3 days a week at home and document & focus on DASH diet. Continue current medication regimen and adjust as needed.  Urine ALB 03 November 2022, continue Lisinopril for kidney protection.  LABS: CBC, CMP, TSH.  Could consider discontinuation of HCTZ if kidney function remains lower side and increase Lisinopril as needed for BP.   Refills sent in.  Return in 6 months.

## 2024-06-10 NOTE — Progress Notes (Signed)
 BP 124/75 (BP Location: Left Arm, Patient Position: Sitting, Cuff Size: Large)   Pulse 69   Temp 98.4 F (36.9 C) (Oral)   Resp 15   Ht 5' 10 (1.778 m)   Wt 213 lb 6.4 oz (96.8 kg)   SpO2 100%   BMI 30.62 kg/m    Subjective:    Patient ID: Fernando Frederick, male    DOB: Jan 11, 1956, 68 y.o.   MRN: [id number]  HPI: Fernando Frederick is a 68 y.o. male presenting on 06/10/2024 for comprehensive medical examination. Current medical complaints include:none  He currently lives with: wife Interim Problems from his last visit: no   DIABETES March A1c 6.2%. Takes  Metformin  500 MG BID. A former smoker, quit >20 years ago.  Smoked for 40 years.  A pack of cigarettes lasted 2-3 days.   Hypoglycemic episodes:no Polydipsia/polyuria: no Visual disturbance: no Chest pain: no Paresthesias: no Glucose Monitoring: yes             Accucheck frequency: occasional             Fasting glucose: 109 recently             Post prandial:             Evening:             Before meals: Taking Insulin?: no             Long acting insulin:             Short acting insulin: Blood Pressure Monitoring: not checking Retinal Examination: Up To Date -- scheduled for laser surgery at end of month Foot Exam: Up to Date Pneumovax: Up to Date Influenza: Up to Date Aspirin: yes    HYPERTENSION / HYPERLIPIDEMIA Taking Amlodipine , Lisinopril -HCTZ and Crestor .  Satisfied with current treatment? yes Duration of hypertension: chronic BP monitoring frequency: not checking BP range:  BP medication side effects: no Duration of hyperlipidemia: chronic Cholesterol medication side effects: no Cholesterol supplements: none Medication compliance: good compliance Aspirin: yes Recent stressors: no Recurrent headaches: no Visual changes: no Palpitations: has been having heart flutters since August event noted below, no CP with these. Flutters are less frequent now, but still notices them.  Mainly at night. Dyspnea:  see below, one episode Chest pain: none Lower extremity edema: no Dizzy/lightheaded: yes, had one episode in August when working outside + was short of breath with this.  Did not have LOC.  No CP.   CHRONIC KIDNEY DISEASE (CKD 3a) & PROSTATE CA Takes Lisinopril  for kidney protection.    Follows with urology for prostate CA with last visit 09/03/23 -- IMRT performed 03/19/23 to 04/25/23.  June PSA 0.10. Had visit with Dr. Marcey 02/23/24. CKD status: stable Medications renally dose: yes Previous renal evaluation: no Pneumovax:  Up to Date Influenza Vaccine: Up to Date  Functional Status Survey: Is the patient deaf or have difficulty hearing?: No Does the patient have difficulty seeing, even when wearing glasses/contacts?: No Does the patient have difficulty concentrating, remembering, or making decisions?: No Does the patient have difficulty walking or climbing stairs?: No Does the patient have difficulty dressing or bathing?: No Does the patient have difficulty doing errands alone such as visiting a doctor's office or shopping?: No  FALL RISK:    06/10/2024    9:01 AM 11/10/2023    8:23 AM 06/10/2023   11:39 AM 05/12/2023    8:18 AM 11/04/2022    9:37 AM  Fall  Risk   Falls in the past year? 0 0 0 0 0  Number falls in past yr: 0 0 0 0 0  Injury with Fall? 0 0 0 0 0  Risk for fall due to : No Fall Risks No Fall Risks No Fall Risks No Fall Risks No Fall Risks  Follow up Falls evaluation completed Falls evaluation completed Falls prevention discussed Falls evaluation completed Falls evaluation completed   Depression Screen    06/10/2024    9:08 AM 11/10/2023    8:23 AM 06/10/2023   11:35 AM 05/12/2023    8:18 AM 11/04/2022    9:37 AM  Depression screen PHQ 2/9  Decreased Interest 0 0 0 0 0  Down, Depressed, Hopeless 0 0 0 0 0  PHQ - 2 Score 0 0 0 0 0  Altered sleeping 0 0 0 0 0  Tired, decreased energy 2 0 0 0 0  Change in appetite 0 0 0 0 0  Feeling bad or failure about yourself   0 0 0 3 0  Trouble concentrating 0 0 0 0 0  Moving slowly or fidgety/restless 0 0 0 0 0  Suicidal thoughts 0 0 0 0 0  PHQ-9 Score 2 0 0 3 0  Difficult doing work/chores  Not difficult at all Not difficult at all Somewhat difficult Not difficult at all      06/10/2024    9:09 AM 11/10/2023    8:24 AM 05/12/2023    8:18 AM 11/04/2022    9:37 AM  GAD 7 : Generalized Anxiety Score  Nervous, Anxious, on Edge 0 0 0 0  Control/stop worrying 0 0 0 0  Worry too much - different things 0 0 0 0  Trouble relaxing 0 0 0 0  Restless 0 0 0 0  Easily annoyed or irritable 0 0 0 0  Afraid - awful might happen 0 0 0 0  Total GAD 7 Score 0 0 0 0  Anxiety Difficulty  Not difficult at all Not difficult at all Not difficult at all     Past Medical History:  Past Medical History:  Diagnosis Date   Benign hypertension with chronic kidney disease 04/24/2015   Chronic kidney disease    CKD (chronic kidney disease), stage III (HCC) 04/24/2015   Diabetes (HCC) 04/24/2015   Diabetes mellitus without complication (HCC)    Gout 04/24/2015   Hyperlipidemia    Hypertension     Surgical History:  Past Surgical History:  Procedure Laterality Date   COLONOSCOPY WITH PROPOFOL  N/A 12/08/2020   Procedure: COLONOSCOPY WITH PROPOFOL ;  Surgeon: Jinny Carmine, MD;  Location: [facility] ENDOSCOPY;  Service: Endoscopy;  Laterality: N/A;   COLONOSCOPY WITH PROPOFOL  N/A 02/20/2021   Procedure: COLONOSCOPY WITH PROPOFOL ;  Surgeon: Jinny Carmine, MD;  Location: [facility] ENDOSCOPY;  Service: Endoscopy;  Laterality: N/A;   COLONOSCOPY WITH PROPOFOL  N/A 06/11/2022   Procedure: COLONOSCOPY WITH PROPOFOL ;  Surgeon: Jinny Carmine, MD;  Location: [facility] ENDOSCOPY;  Service: Endoscopy;  Laterality: N/A;   partial thumb amputation     PROSTATE SURGERY     Prostate Biopsy   Medications:  Current Outpatient Medications on File Prior to Visit  Medication Sig   aspirin 81 MG tablet Take 81 mg by mouth daily.   Calcium -Magnesium-Vitamin D  (CALCIUM   1200+D3 PO) Take 1 tablet by mouth daily.   Cholecalciferol (VITAMIN D -3) 1000 UNITS CAPS Take 1,000 Units by mouth daily.   cyanocobalamin 2000 MCG tablet Take 2,000 mcg by mouth daily. One  tablet daily   Multiple Vitamin (MULTIVITAMIN) tablet Take 1 tablet by mouth daily.   Omega-3 Fatty Acids (FISH OIL) 1000 MG CAPS Take 1,000 mg by mouth daily.   No current facility-administered medications on file prior to visit.    Allergies:  No Known Allergies  Social History:  Social History   Socioeconomic History   Marital status: Married    Spouse name: Romero   Number of children: 3   Years of education: Not on file   Highest education level: Not on file  Occupational History   Occupation: retired  Tobacco Use   Smoking status: Former    Current packs/day: 0.00    Average packs/day: 0.3 packs/day for 40.0 years (10.0 ttl pk-yrs)    Types: Cigarettes    Start date: 1966    Quit date: 08/26/2004    Years since quitting: 19.8    Passive exposure: Past   Smokeless tobacco: Never  Vaping Use   Vaping status: Never Used  Substance and Sexual Activity   Alcohol use: Yes    Alcohol/week: 6.0 standard drinks of alcohol    Types: 6 Cans of beer per week    Comment: drinks 2 beers 3 days per week   Drug use: No   Sexual activity: Yes  Other Topics Concern   Not on file  Social History Narrative   Married, 3 sons-2 living and 1 deceased.   Social Drivers of Corporate investment banker Strain: Low Risk  (06/10/2023)   Overall Financial Resource Strain (CARDIA)    Difficulty of Paying Living Expenses: Not hard at all  Food Insecurity: No Food Insecurity (06/10/2023)   Hunger Vital Sign    Worried About Running Out of Food in the Last Year: Never true    Ran Out of Food in the Last Year: Never true  Transportation Needs: No Transportation Needs (06/10/2023)   PRAPARE - Administrator, Civil Service (Medical): No    Lack of Transportation (Non-Medical): No  Physical  Activity: Insufficiently Active (06/10/2023)   Exercise Vital Sign    Days of Exercise per Week: 3 days    Minutes of Exercise per Session: 30 min  Stress: No Stress Concern Present (06/10/2023)   [employer] of Occupational Health - Occupational Stress Questionnaire    Feeling of Stress : Not at all  Social Connections: Moderately Integrated (06/10/2023)   Social Connection and Isolation Panel    Frequency of Communication with Friends and Family: More than three times a week    Frequency of Social Gatherings with Friends and Family: More than three times a week    Attends Religious Services: More than 4 times per year    Active Member of [employer] or Organizations: No    Attends Banker Meetings: Never    Marital Status: Married  Catering manager Violence: Not At Risk (06/10/2023)   Humiliation, Afraid, Rape, and Kick questionnaire    Fear of Current or Ex-Partner: No    Emotionally Abused: No    Physically Abused: No    Sexually Abused: No   Social History   Tobacco Use  Smoking Status Former   Current packs/day: 0.00   Average packs/day: 0.3 packs/day for 40.0 years (10.0 ttl pk-yrs)   Types: Cigarettes   Start date: 1966   Quit date: 08/26/2004   Years since quitting: 19.8   Passive exposure: Past  Smokeless Tobacco Never   Social History   Substance and Sexual Activity  Alcohol  Use Yes   Alcohol/week: 6.0 standard drinks of alcohol   Types: 6 Cans of beer per week   Comment: drinks 2 beers 3 days per week    Family History:  Family History  Problem Relation Age of Onset   Cancer Mother        lung   Hypertension Mother    Diabetes Father    Hypertension Father    Kidney disease Father    Hypertension Sister    Hypertension Sister    Hypertension Sister    Kidney disease Brother     Past medical history, surgical history, medications, allergies, family history and social history reviewed with patient today and changes made to appropriate  areas of the chart.   Review of Systems - negative All other ROS negative except what is listed above and in the HPI.      Objective:    BP 124/75 (BP Location: Left Arm, Patient Position: Sitting, Cuff Size: Large)   Pulse 69   Temp 98.4 F (36.9 C) (Oral)   Resp 15   Ht 5' 10 (1.778 m)   Wt 213 lb 6.4 oz (96.8 kg)   SpO2 100%   BMI 30.62 kg/m   Wt Readings from Last 3 Encounters:  06/10/24 213 lb 6.4 oz (96.8 kg)  02/23/24 206 lb 6.4 oz (93.6 kg)  11/10/23 210 lb 12.8 oz (95.6 kg)    Physical Exam Vitals and nursing note reviewed.  Constitutional:      General: He is awake. He is not in acute distress.    Appearance: He is well-developed and well-groomed. He is not ill-appearing or toxic-appearing.  HENT:     Head: Normocephalic and atraumatic.     Right Ear: Hearing, tympanic membrane, ear Frederick and external ear normal. No drainage.     Left Ear: Hearing, tympanic membrane, ear Frederick and external ear normal. No drainage.     Nose: Nose normal.     Mouth/Throat:     Pharynx: Uvula midline.  Eyes:     General: Lids are normal.        Right eye: No discharge.        Left eye: No discharge.     Extraocular Movements: Extraocular movements intact.     Conjunctiva/sclera: Conjunctivae normal.     Pupils: Pupils are equal, round, and reactive to light.     Visual Fields: Right eye visual fields normal and left eye visual fields normal.  Neck:     Thyroid : No thyromegaly.     Vascular: No carotid bruit or JVD.     Trachea: Trachea normal.  Cardiovascular:     Rate and Rhythm: Normal rate and regular rhythm.     Heart sounds: Normal heart sounds, S1 normal and S2 normal. No murmur heard.    No gallop.  Pulmonary:     Effort: Pulmonary effort is normal. No accessory muscle usage or respiratory distress.     Breath sounds: Normal breath sounds.  Abdominal:     General: Bowel sounds are normal.     Palpations: Abdomen is soft. There is no hepatomegaly or splenomegaly.      Tenderness: There is no abdominal tenderness.  Musculoskeletal:        General: Normal range of motion.     Cervical back: Normal range of motion and neck supple.     Right lower leg: No edema.     Left lower leg: No edema.  Lymphadenopathy:     Head:  Right side of head: No submental, submandibular, tonsillar, preauricular or posterior auricular adenopathy.     Left side of head: No submental, submandibular, tonsillar, preauricular or posterior auricular adenopathy.     Cervical: No cervical adenopathy.  Skin:    General: Skin is warm and dry.     Capillary Refill: Capillary refill takes less than 2 seconds.     Findings: No rash.  Neurological:     Mental Status: He is alert and oriented to person, place, and time.     Gait: Gait is intact.     Deep Tendon Reflexes: Reflexes are normal and symmetric.     Reflex Scores:      Brachioradialis reflexes are 2+ on the right side and 2+ on the left side.      Patellar reflexes are 2+ on the right side and 2+ on the left side. Psychiatric:        Attention and Perception: Attention normal.        Mood and Affect: Mood normal.        Speech: Speech normal.        Behavior: Behavior normal. Behavior is cooperative.        Thought Content: Thought content normal.        Cognition and Memory: Cognition normal.        Judgment: Judgment normal.   EKG My review and personal interpretation at Time: 1010  Indication: palpitations  Rate: 68  Rhythm: sinus Axis: normal Other: No nonspecific st abn, no stemi, no lvh, possible LBBB present   Diabetic Foot Exam - Simple   Simple Foot Form Visual Inspection No deformities, no ulcerations, no other skin breakdown bilaterally: Yes Sensation Testing Intact to touch and monofilament testing bilaterally: Yes Pulse Check Posterior Tibialis and Dorsalis pulse intact bilaterally: Yes Comments     Results for orders placed or performed in visit on 02/16/24  PSA   Collection Time:  02/16/24 11:34 AM  Result Value Ref Range   Prostatic Specific Antigen 0.10 0.00 - 4.00 ng/mL      Assessment & Plan:   Problem List Items Addressed This Visit       Cardiovascular and Mediastinum   Hypertension associated with diabetes (HCC)   Chronic, stable.  BP at goal in office today.  Recommend he check BP at least 3 days a week at home and document & focus on DASH diet. Continue current medication regimen and adjust as needed.  Urine ALB 03 November 2022, continue Lisinopril  for kidney protection.  LABS: CBC, CMP, TSH.  Could consider discontinuation of HCTZ if kidney function remains lower side and increase Lisinopril  as needed for BP.  Refills sent in.  Return in 6 months.      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  A1c 5.7% today and urine ALB 80 March 2025.  At this time continue Metformin  500 MG BID, had a trial lowering in past but sugars increased at home -- could consider discontinuation of this in future and changing to Farxiga for more kidney protection, discussed with him today.  Continue Lisinopril  for kidney protection.  Recommend he continue  to check BS a few times a week at home.  Refills sent in.  Return in 6 months for follow-up.        Relevant Medications   lisinopril -hydrochlorothiazide  (ZESTORETIC ) 20-12.5 MG tablet   metFORMIN  (GLUCOPHAGE ) 500 MG tablet   rosuvastatin  (CRESTOR ) 40 MG tablet   Other Relevant Orders   Bayer DCA Hb A1c Waived   Ambulatory referral to Cardiology   Hyperlipidemia associated with type 2 diabetes mellitus (HCC)   Chronic, ongoing.  Continue current medication regimen and adjust as needed.  Lipid  panel today.  Return in 6 months.      Relevant Medications   amLODipine  (NORVASC ) 10 MG tablet   lisinopril -hydrochlorothiazide  (ZESTORETIC ) 20-12.5 MG tablet   metFORMIN  (GLUCOPHAGE ) 500 MG tablet   rosuvastatin  (CRESTOR ) 40 MG tablet   Other Relevant Orders   Bayer DCA Hb A1c Waived   Comprehensive metabolic panel with GFR   Lipid Panel w/o Chol/HDL Ratio   Diabetes mellitus treated with oral medication (HCC)   Refer to diabetes with nephropathy plan of care.      Relevant Medications   lisinopril -hydrochlorothiazide  (ZESTORETIC ) 20-12.5 MG tablet   metFORMIN  (GLUCOPHAGE ) 500 MG tablet   rosuvastatin  (CRESTOR ) 40 MG tablet   Other Relevant Orders   Bayer DCA Hb A1c Waived   Ambulatory referral to Cardiology     Genitourinary   Prostate cancer [facility])   Ongoing.  Continue to collaborate with urology and oncology, reviewed recent notes and labs.  PSA up to date on labs.      CKD (chronic kidney disease), stage III (HCC)   Chronic, ongoing. Continue Lisinopril  for kidney protection.  CMP today, urine ALB 80 March 2025.  Consider nephrology referral if decline noted.  Could consider discontinuation of HCTZ if decline noted and then increase Lisinopril  as needed for BP control.  Also could consider change from Metformin  to Farxiga in future.      Relevant Orders   Comprehensive metabolic panel with GFR     Other   Palpitations   Started in August on and off.  Had episode of dizziness in August and SOB. EKG in office today reassuring. Continue all current medications.  Order Zio and Echo to further assess.  Referral to cardiology placed.      Relevant Orders   EKG 12-Lead   ECHOCARDIOGRAM COMPLETE   LONG TERM MONITOR XT (3-14 DAYS)   Ambulatory referral to Cardiology   History of smoking   Recommend continued cessation.  Up To Date on AAA screening.      Other Visit Diagnoses       Flu vaccine need       Flu vaccine today, educated patient.   Relevant Orders   Flu  vaccine HIGH DOSE PF(Fluzone Trivalent) (Completed)     Encounter for annual physical exam       Annual physical today with labs and health maintenance reviewed, discussed with patient.        Discussed aspirin prophylaxis for myocardial infarction prevention and decision was made to continue ASA  LABORATORY TESTING:  Health maintenance labs ordered today as discussed above.   The natural history of prostate cancer and ongoing controversy regarding screening and potential treatment outcomes of prostate cancer has been discussed with the patient. The meaning of a false positive PSA and a false negative PSA has been discussed. He indicates understanding of the limitations of this screening test and wishes to proceed with screening PSA testing.  These are obtained with urology,  however he wishes performed today due to completing treatment.   IMMUNIZATIONS:   - Tdap: Tetanus vaccination status reviewed: last tetanus booster within 10 years. - Influenza: Provided today - Pneumovax: Up To Date - Prevnar: Up To Date - Zostavax vaccine: Up To Date  SCREENING: - Colonoscopy: Up To Date Discussed with patient purpose of the colonoscopy is to detect colon cancer at curable precancerous or early stages   - AAA Screening: Up To Date was negative -Hearing Test: Not applicable  -Spirometry: Not applicable   PATIENT COUNSELING:    Sexuality: Discussed sexually transmitted diseases, partner selection, use of condoms, avoidance of unintended pregnancy  and contraceptive alternatives.   Advised to avoid cigarette smoking.  I discussed with the patient that most people either abstain from alcohol or drink within safe limits (<=14/week and <=4 drinks/occasion for males, <=7/weeks and <= 3 drinks/occasion for females) and that the risk for alcohol disorders and other health effects rises proportionally with the number of drinks per week and how often a drinker exceeds daily limits.  Discussed  cessation/primary prevention of drug use and availability of treatment for abuse.   Diet: Encouraged to adjust caloric intake to maintain  or achieve ideal body weight, to reduce intake of dietary saturated fat and total fat, to limit sodium intake by avoiding high sodium foods and not adding table salt, and to maintain adequate dietary potassium and calcium  preferably from fresh fruits, vegetables, and low-fat dairy products.    Stressed the importance of regular exercise  Injury prevention: Discussed safety belts, safety helmets, smoke detector, smoking near bedding or upholstery.   Dental health: Discussed importance of regular tooth brushing, flossing, and dental visits.   Follow up plan: NEXT PREVENTATIVE PHYSICAL DUE IN 1 YEAR. Return in about 8 weeks (around 08/05/2024) for Palpitations and 6 months for T2DM, HTN/HLD.

## 2024-06-10 NOTE — Assessment & Plan Note (Signed)
 Ongoing.  Continue to collaborate with urology and oncology, reviewed recent notes and labs.  PSA up to date on labs.

## 2024-06-11 ENCOUNTER — Ambulatory Visit: Payer: Self-pay | Admitting: Nurse Practitioner

## 2024-06-11 DIAGNOSIS — D649 Anemia, unspecified: Secondary | ICD-10-CM

## 2024-06-11 DIAGNOSIS — N1831 Chronic kidney disease, stage 3a: Secondary | ICD-10-CM

## 2024-06-11 LAB — CBC WITH DIFFERENTIAL/PLATELET
Basophils Absolute: 0 x10E3/uL (ref 0.0–0.2)
Basos: 1 %
EOS (ABSOLUTE): 0.3 x10E3/uL (ref 0.0–0.4)
Eos: 8 %
Hematocrit: 38.4 % (ref 37.5–51.0)
Hemoglobin: 12 g/dL — ABNORMAL LOW (ref 13.0–17.7)
Immature Grans (Abs): 0 x10E3/uL (ref 0.0–0.1)
Immature Granulocytes: 0 %
Lymphocytes Absolute: 1.5 x10E3/uL (ref 0.7–3.1)
Lymphs: 36 %
MCH: 26.3 pg — ABNORMAL LOW (ref 26.6–33.0)
MCHC: 31.3 g/dL — ABNORMAL LOW (ref 31.5–35.7)
MCV: 84 fL (ref 79–97)
Monocytes Absolute: 0.3 x10E3/uL (ref 0.1–0.9)
Monocytes: 8 %
Neutrophils Absolute: 1.9 x10E3/uL (ref 1.4–7.0)
Neutrophils: 47 %
Platelets: 257 x10E3/uL (ref 150–450)
RBC: 4.57 x10E6/uL (ref 4.14–5.80)
RDW: 14 % (ref 11.6–15.4)
WBC: 4 x10E3/uL (ref 3.4–10.8)

## 2024-06-11 LAB — LIPID PANEL W/O CHOL/HDL RATIO
Cholesterol, Total: 143 mg/dL (ref 100–199)
HDL: 40 mg/dL (ref >39)
LDL Chol Calc (NIH): 69 mg/dL (ref 0–99)
Triglycerides: 206 mg/dL — ABNORMAL HIGH (ref 0–149)
VLDL Cholesterol Cal: 34 mg/dL (ref 5–40)

## 2024-06-11 LAB — COMPREHENSIVE METABOLIC PANEL WITH GFR
ALT: 13 IU/L (ref 0–44)
AST: 20 IU/L (ref 0–40)
Albumin: 4.4 g/dL (ref 3.9–4.9)
Alkaline Phosphatase: 73 IU/L (ref 47–123)
BUN/Creatinine Ratio: 17 (ref 10–24)
BUN: 33 mg/dL — ABNORMAL HIGH (ref 8–27)
Bilirubin Total: 0.3 mg/dL (ref 0.0–1.2)
CO2: 21 mmol/L (ref 20–29)
Calcium: 9.8 mg/dL (ref 8.6–10.2)
Chloride: 103 mmol/L (ref 96–106)
Creatinine, Ser: 2 mg/dL — ABNORMAL HIGH (ref 0.76–1.27)
Globulin, Total: 2.9 g/dL (ref 1.5–4.5)
Glucose: 112 mg/dL — ABNORMAL HIGH (ref 70–99)
Potassium: 4.6 mmol/L (ref 3.5–5.2)
Sodium: 139 mmol/L (ref 134–144)
Total Protein: 7.3 g/dL (ref 6.0–8.5)
eGFR: 35 mL/min/1.73 — ABNORMAL LOW (ref >59)

## 2024-06-11 LAB — TSH: TSH: 0.903 u[IU]/mL (ref 0.450–4.500)

## 2024-06-11 MED ORDER — EZETIMIBE 10 MG PO TABS: 10.0000 mg | ORAL_TABLET | Freq: Every day | ORAL | 3 refills | Status: AC

## 2024-06-24 ENCOUNTER — Telehealth: Payer: Self-pay

## 2024-06-24 NOTE — Telephone Encounter (Signed)
 Copied from CRM [id number]. Topic: Clinical - Request for Lab/Test Order >> Jun 23, 2024  2:29 PM Fernando Frederick wrote: Reason for CRM:  [facility] need a PA for pt to get an echocardiogram. No appt until PA is complete   Please call back at [phone]

## 2024-06-24 NOTE — Telephone Encounter (Signed)
 Referral team- can you guys work on this authorization please? Echo was ordered by Jolene

## 2024-06-25 DIAGNOSIS — H2512 Age-related nuclear cataract, left eye: Secondary | ICD-10-CM | POA: Diagnosis not present

## 2024-06-25 DIAGNOSIS — H2511 Age-related nuclear cataract, right eye: Secondary | ICD-10-CM | POA: Diagnosis not present

## 2024-06-25 DIAGNOSIS — H5371 Glare sensitivity: Secondary | ICD-10-CM | POA: Diagnosis not present

## 2024-06-25 DIAGNOSIS — R002 Palpitations: Secondary | ICD-10-CM | POA: Diagnosis not present

## 2024-06-26 ENCOUNTER — Other Ambulatory Visit: Payer: Self-pay | Admitting: Cardiology

## 2024-06-26 DIAGNOSIS — R9431 Abnormal electrocardiogram [ECG] [EKG]: Secondary | ICD-10-CM

## 2024-06-26 DIAGNOSIS — R002 Palpitations: Secondary | ICD-10-CM

## 2024-06-26 DIAGNOSIS — H2511 Age-related nuclear cataract, right eye: Secondary | ICD-10-CM | POA: Diagnosis not present

## 2024-06-26 NOTE — Progress Notes (Signed)
 Contacted via MyChart  Good afternoon Fernando Frederick, your heart monitor returned and you did have frequent episodes of SVT (where the heart beats faster for a period). Due to this ensure to keep your visit with cardiology for further assessment and recommendations.  No atrial fibrillation noted. Any questions?

## 2024-07-01 ENCOUNTER — Ambulatory Visit: Attending: Cardiology | Admitting: Cardiology

## 2024-07-01 ENCOUNTER — Encounter: Payer: Self-pay | Admitting: Cardiology

## 2024-07-01 VITALS — BP 134/66 | HR 70 | Ht 71.0 in | Wt 216.6 lb

## 2024-07-01 DIAGNOSIS — I471 Supraventricular tachycardia, unspecified: Secondary | ICD-10-CM | POA: Diagnosis not present

## 2024-07-01 DIAGNOSIS — E782 Mixed hyperlipidemia: Secondary | ICD-10-CM | POA: Diagnosis not present

## 2024-07-01 DIAGNOSIS — I10 Essential (primary) hypertension: Secondary | ICD-10-CM

## 2024-07-01 MED ORDER — METOPROLOL SUCCINATE ER 50 MG PO TB24: 50.0000 mg | ORAL_TABLET | Freq: Every day | ORAL | 3 refills | Status: DC

## 2024-07-01 MED ORDER — AMLODIPINE BESYLATE 5 MG PO TABS: 5.0000 mg | ORAL_TABLET | Freq: Every day | ORAL | Status: DC

## 2024-07-01 NOTE — Patient Instructions (Signed)
 Medication Instructions:  - START toprol XL 50 mg daily  - REDUCE norvasc  to 5 mg daily   *If you need a refill on your cardiac medications before your next appointment, please call your pharmacy*  Lab Work: No labs ordered today  If you have labs (blood work) drawn today and your tests are completely normal, you will receive your results only by: MyChart Message (if you have MyChart) OR A paper copy in the mail If you have any lab test that is abnormal or we need to change your treatment, we will call you to review the results.  Testing/Procedures: No test ordered today   Follow-Up: At [facility], you and your health needs are our priority.  As part of our continuing mission to provide you with exceptional heart care, our providers are all part of one team.  This team includes your primary Cardiologist (physician) and Advanced Practice Providers or APPs (Physician Assistants and Nurse Practitioners) who all work together to provide you with the care you need, when you need it.  Your next appointment:   4 week(s)  Provider:   Redell Cave, MD    We recommend signing up for the patient portal called MyChart.  Sign up information is provided on this After Visit Summary.  MyChart is used to connect with patients for Virtual Visits (Telemedicine).  Patients are able to view lab/test results, encounter notes, upcoming appointments, etc.  Non-urgent messages can be sent to your provider as well.   To learn more about what you can do with MyChart, go to [email or web address].

## 2024-07-01 NOTE — Progress Notes (Signed)
 Cardiology Office Note:    Date:  07/01/2024   ID:  Fernando Frederick, DOB 22-Sep-1955, MRN [id number]  PCP:  Fernando Melanie DASEN, NP   [facility] Providers Cardiologist:  None     Referring MD: Fernando Melanie DASEN, NP   Chief Complaint  Patient presents with   Palpitations    Patient is doing well on today. Meds reviewed.     History of Present Illness:    Fernando Frederick is a 68 y.o. male with a hx of hypertension, hyperlipidemia, diabetes, CKD presenting due to SVT.  Saw primary care provider a month ago due to symptoms of palpitations.  Cardiac monitor and echocardiogram was obtained.  Echocardiogram currently scheduled for next week, monitor obtained 06/25/2024 showed frequent SVT episodes, longest lasting 31 minutes, 58 seconds.  Denies chest pain, denies shortness of breath, endorses an episode of dizziness 2 to 3 months ago while working out in his yard.  Has not had any episodes since.  Overall doing okay.  Zetia recently added to cholesterol regimen.  Past Medical History:  Diagnosis Date   Benign hypertension with chronic kidney disease 04/24/2015   Chronic kidney disease    CKD (chronic kidney disease), stage III (HCC) 04/24/2015   Diabetes (HCC) 04/24/2015   Diabetes mellitus without complication (HCC)    Gout 04/24/2015   Hyperlipidemia    Hypertension     Past Surgical History:  Procedure Laterality Date   COLONOSCOPY WITH PROPOFOL  N/A 12/08/2020   Procedure: COLONOSCOPY WITH PROPOFOL ;  Surgeon: Jinny Carmine, MD;  Location: [facility] ENDOSCOPY;  Service: Endoscopy;  Laterality: N/A;   COLONOSCOPY WITH PROPOFOL  N/A 02/20/2021   Procedure: COLONOSCOPY WITH PROPOFOL ;  Surgeon: Jinny Carmine, MD;  Location: [facility] ENDOSCOPY;  Service: Endoscopy;  Laterality: N/A;   COLONOSCOPY WITH PROPOFOL  N/A 06/11/2022   Procedure: COLONOSCOPY WITH PROPOFOL ;  Surgeon: Jinny Carmine, MD;  Location: [facility] ENDOSCOPY;  Service: Endoscopy;  Laterality: N/A;   partial thumb amputation      PROSTATE SURGERY     Prostate Biopsy    Current Medications: Current Meds  Medication Sig   aspirin 81 MG tablet Take 81 mg by mouth daily.   Calcium -Magnesium-Vitamin D  (CALCIUM  1200+D3 PO) Take 1 tablet by mouth daily.   Cholecalciferol (VITAMIN D -3) 1000 UNITS CAPS Take 1,000 Units by mouth daily.   cyanocobalamin 2000 MCG tablet Take 2,000 mcg by mouth daily. One tablet daily   ezetimibe (ZETIA) 10 MG tablet Take 1 tablet (10 mg total) by mouth daily.   gatifloxacin (ZYMAXID) 0.5 % SOLN Place 1 drop into the left eye 4 (four) times daily.   ketorolac (ACULAR) 0.5 % ophthalmic solution Place 1 drop into the left eye 2 (two) times daily.   lisinopril -hydrochlorothiazide  (ZESTORETIC ) 20-12.5 MG tablet Take 1 tablet by mouth daily.   metFORMIN  (GLUCOPHAGE ) 500 MG tablet Take 1 tablet (500 mg total) by mouth 2 (two) times daily with a meal.   metoprolol succinate (TOPROL-XL) 50 MG 24 hr tablet Take 1 tablet (50 mg total) by mouth daily. Take with or immediately following a meal.   Multiple Vitamin (MULTIVITAMIN) tablet Take 1 tablet by mouth daily.   Omega-3 Fatty Acids (FISH OIL) 1000 MG CAPS Take 1,000 mg by mouth daily.   prednisoLONE acetate (PRED FORTE) 1 % ophthalmic suspension SMARTSIG:In Eye(s)   rosuvastatin  (CRESTOR ) 40 MG tablet Take 1 tablet (40 mg total) by mouth daily.   [DISCONTINUED] amLODipine  (NORVASC ) 10 MG tablet Take 1 tablet (10 mg total) by mouth  daily.     Allergies:   Patient has no known allergies.   Social History   Socioeconomic History   Marital status: Married    Spouse name: Fernando Frederick   Number of children: 3   Years of education: Not on file   Highest education level: Not on file  Occupational History   Occupation: retired  Tobacco Use   Smoking status: Former    Current packs/day: 0.00    Average packs/day: 0.3 packs/day for 40.0 years (10.0 ttl pk-yrs)    Types: Cigarettes    Start date: 1966    Quit date: 08/26/2004    Years since quitting: 19.8     Passive exposure: Past   Smokeless tobacco: Never  Vaping Use   Vaping status: Never Used  Substance and Sexual Activity   Alcohol use: Yes    Alcohol/week: 6.0 standard drinks of alcohol    Types: 6 Cans of beer per week    Comment: drinks 2 beers 3 days per week   Drug use: No   Sexual activity: Yes  Other Topics Concern   Not on file  Social History Narrative   Married, 3 sons-2 living and 1 deceased.   Social Drivers of Corporate Investment Banker Strain: Low Risk  (06/10/2023)   Overall Financial Resource Strain (CARDIA)    Difficulty of Paying Living Expenses: Not hard at all  Food Insecurity: No Food Insecurity (06/10/2023)   Hunger Vital Sign    Worried About Running Out of Food in the Last Year: Never true    Ran Out of Food in the Last Year: Never true  Transportation Needs: No Transportation Needs (06/10/2023)   PRAPARE - Administrator, Civil Service (Medical): No    Lack of Transportation (Non-Medical): No  Physical Activity: Insufficiently Active (06/10/2023)   Exercise Vital Sign    Days of Exercise per Week: 3 days    Minutes of Exercise per Session: 30 min  Stress: No Stress Concern Present (06/10/2023)   [employer] of Occupational Health - Occupational Stress Questionnaire    Feeling of Stress : Not at all  Social Connections: Moderately Integrated (06/10/2023)   Social Connection and Isolation Panel    Frequency of Communication with Friends and Family: More than three times a week    Frequency of Social Gatherings with Friends and Family: More than three times a week    Attends Religious Services: More than 4 times per year    Active Member of [employer] or Organizations: No    Attends Engineer, Structural: Never    Marital Status: Married     Family History: The patient's family history includes Cancer in his mother; Diabetes in his father; Hypertension in his father, mother, sister, sister, and sister; Kidney disease in  his brother and father.  ROS:   Please see the history of present illness.     All other systems reviewed and are negative.  EKGs/Labs/Other Studies Reviewed:    The following studies were reviewed today:  EKG Interpretation Date/Time:  Thursday July 01 2024 08:25:35 EST Ventricular Rate:  70 PR Interval:  172 QRS Duration:  82 QT Interval:  382 QTC Calculation: 412 R Axis:   -4  Text Interpretation: Normal sinus rhythm Normal ECG Confirmed by Darliss Rogue ([id number]) on 07/01/2024 8:37:52 AM    Recent Labs: 06/10/2024: ALT 13; BUN 33; Creatinine, Ser 2.00; Hemoglobin 12.0; Platelets 257; Potassium 4.6; Sodium 139; TSH 0.903  Recent Lipid Panel  Component Value Date/Time   CHOL 143 06/10/2024 0923   CHOL 147 07/30/2019 1449   TRIG 206 (H) 06/10/2024 0923   TRIG 189 (H) 07/30/2019 1449   HDL 40 06/10/2024 0923   CHOLHDL 3.4 06/05/2018 1550   VLDL 38 (H) 07/30/2019 1449   LDLCALC 69 06/10/2024 0923     Risk Assessment/Calculations:             Physical Exam:    VS:  BP 134/66   Pulse 70   Ht 5' 11 (1.803 m)   Wt 216 lb 9.6 oz (98.2 kg)   SpO2 98%   BMI 30.21 kg/m     Wt Readings from Last 3 Encounters:  07/01/24 216 lb 9.6 oz (98.2 kg)  06/10/24 213 lb 6.4 oz (96.8 kg)  02/23/24 206 lb 6.4 oz (93.6 kg)     GEN:  Well nourished, well developed in no acute distress HEENT: Normal NECK: No JVD; No carotid bruits CARDIAC: RRR, 2/6 systolic murmur loudest at apex. RESPIRATORY:  Clear to auscultation without rales, wheezing or rhonchi  ABDOMEN: Soft, non-tender, non-distended MUSCULOSKELETAL:  No edema; No deformity  SKIN: Warm and dry NEUROLOGIC:  Alert and oriented x 3 PSYCHIATRIC:  Normal affect   ASSESSMENT:    1. SVT (supraventricular tachycardia)   2. Primary hypertension   3. Mixed hyperlipidemia    PLAN:    In order of problems listed above:  SVT, palpitations.  Obtain echo as scheduled next week.  Start Toprol-XL 50 mg  daily. Hypertension, BP controlled.  Reduce Norvasc  to 5 mg daily with starting Toprol-XL as above.  Continue lisinopril -HCTZ 20-12.5 mg daily. Hyperlipidemia, continue Crestor  40 mg daily, Zetia 10 mg daily. Systolic murmur at apex, suggesting mitral regurgitation.  Echo as above.  Follow-up in 5 to 6 weeks.  Check symptoms, check BP.      Medication Adjustments/Labs and Tests Ordered: Current medicines are reviewed at length with the patient today.  Concerns regarding medicines are outlined above.  Orders Placed This Encounter  Procedures   EKG 12-Lead   Meds ordered this encounter  Medications   amLODipine  (NORVASC ) 5 MG tablet    Sig: Take 1 tablet (5 mg total) by mouth daily.   metoprolol succinate (TOPROL-XL) 50 MG 24 hr tablet    Sig: Take 1 tablet (50 mg total) by mouth daily. Take with or immediately following a meal.    Dispense:  90 tablet    Refill:  3    Patient Instructions  Medication Instructions:  - START toprol XL 50 mg daily  - REDUCE norvasc  to 5 mg daily   *If you need a refill on your cardiac medications before your next appointment, please call your pharmacy*  Lab Work: No labs ordered today  If you have labs (blood work) drawn today and your tests are completely normal, you will receive your results only by: MyChart Message (if you have MyChart) OR A paper copy in the mail If you have any lab test that is abnormal or we need to change your treatment, we will call you to review the results.  Testing/Procedures: No test ordered today   Follow-Up: At [facility], you and your health needs are our priority.  As part of our continuing mission to provide you with exceptional heart care, our providers are all part of one team.  This team includes your primary Cardiologist (physician) and Advanced Practice Providers or APPs (Physician Assistants and Nurse Practitioners) who all work together to  provide you with the care you need, when you need  it.  Your next appointment:   4 week(s)  Provider:   Redell Cave, MD    We recommend signing up for the patient portal called MyChart.  Sign up information is provided on this After Visit Summary.  MyChart is used to connect with patients for Virtual Visits (Telemedicine).  Patients are able to view lab/test results, encounter notes, upcoming appointments, etc.  Non-urgent messages can be sent to your provider as well.   To learn more about what you can do with MyChart, go to [email or web address].             Signed, Redell Cave, MD  07/01/2024 12:12 PM    [facility]

## 2024-07-07 ENCOUNTER — Ambulatory Visit
Admission: RE | Admit: 2024-07-07 | Discharge: 2024-07-07 | Disposition: A | Discharge status: Home / Self Care | Source: Ambulatory Visit | Attending: Nurse Practitioner | Admitting: Nurse Practitioner

## 2024-07-07 DIAGNOSIS — E1169 Type 2 diabetes mellitus with other specified complication: Secondary | ICD-10-CM | POA: Diagnosis not present

## 2024-07-07 DIAGNOSIS — I340 Nonrheumatic mitral (valve) insufficiency: Secondary | ICD-10-CM | POA: Insufficient documentation

## 2024-07-07 DIAGNOSIS — N183 Chronic kidney disease, stage 3 unspecified: Secondary | ICD-10-CM | POA: Insufficient documentation

## 2024-07-07 DIAGNOSIS — R0600 Dyspnea, unspecified: Secondary | ICD-10-CM | POA: Insufficient documentation

## 2024-07-07 DIAGNOSIS — I129 Hypertensive chronic kidney disease with stage 1 through stage 4 chronic kidney disease, or unspecified chronic kidney disease: Secondary | ICD-10-CM | POA: Diagnosis not present

## 2024-07-07 DIAGNOSIS — Z87891 Personal history of nicotine dependence: Secondary | ICD-10-CM | POA: Diagnosis not present

## 2024-07-07 DIAGNOSIS — E7849 Other hyperlipidemia: Secondary | ICD-10-CM | POA: Insufficient documentation

## 2024-07-07 DIAGNOSIS — R002 Palpitations: Secondary | ICD-10-CM | POA: Diagnosis not present

## 2024-07-07 DIAGNOSIS — E1122 Type 2 diabetes mellitus with diabetic chronic kidney disease: Secondary | ICD-10-CM | POA: Diagnosis not present

## 2024-07-07 LAB — ECHOCARDIOGRAM COMPLETE
AR max vel: 3.84 cm2
AV Peak grad: 7 mmHg
Ao pk vel: 1.32 m/s
Area-P 1/2: 2.83 cm2
S' Lateral: 2.9 cm

## 2024-07-07 NOTE — Progress Notes (Signed)
 Contacted via MyChart  Good day Fernando Frederick, your echo has returned. Pump of heart is good with ejection fraction of 55 to 60%. Great news. The left ventricle shows mild enlargement, which can be seen at times in patient with high blood pressure and is something we will continue to monitor + maintain good blood pressure control. Mitral valve has mild regurgitation, or flow backwards through valve, but this is mild and should not cause any symptoms you have experienced. Aortic valve has some sclerosis or hardening, but no stenosis (narrowing) which we can monitor. Overall reassuring echo. Any questions?

## 2024-07-07 NOTE — Progress Notes (Signed)
 Echocardiogram 2D Echocardiogram has been performed.  [name] 07/07/2024, 12:13 PM

## 2024-07-14 ENCOUNTER — Other Ambulatory Visit

## 2024-07-14 DIAGNOSIS — N1831 Chronic kidney disease, stage 3a: Secondary | ICD-10-CM | POA: Diagnosis not present

## 2024-07-14 DIAGNOSIS — D649 Anemia, unspecified: Secondary | ICD-10-CM

## 2024-07-15 ENCOUNTER — Encounter: Payer: Self-pay | Admitting: Nurse Practitioner

## 2024-07-15 ENCOUNTER — Ambulatory Visit: Payer: Self-pay | Admitting: Nurse Practitioner

## 2024-07-15 DIAGNOSIS — N1832 Chronic kidney disease, stage 3b: Secondary | ICD-10-CM

## 2024-07-15 LAB — BASIC METABOLIC PANEL WITH GFR
BUN/Creatinine Ratio: 12 (ref 10–24)
BUN: 27 mg/dL (ref 8–27)
CO2: 19 mmol/L — ABNORMAL LOW (ref 20–29)
Calcium: 9.7 mg/dL (ref 8.6–10.2)
Chloride: 107 mmol/L — ABNORMAL HIGH (ref 96–106)
Creatinine, Ser: 2.27 mg/dL — ABNORMAL HIGH (ref 0.76–1.27)
Glucose: 97 mg/dL (ref 70–99)
Potassium: 4.8 mmol/L (ref 3.5–5.2)
Sodium: 142 mmol/L (ref 134–144)
eGFR: 30 mL/min/1.73 — ABNORMAL LOW (ref >59)

## 2024-07-15 LAB — CBC WITH DIFFERENTIAL/PLATELET
Basophils Absolute: 0 x10E3/uL (ref 0.0–0.2)
Basos: 1 %
EOS (ABSOLUTE): 0.3 x10E3/uL (ref 0.0–0.4)
Eos: 5 %
Hematocrit: 37.1 % — ABNORMAL LOW (ref 37.5–51.0)
Hemoglobin: 11.9 g/dL — ABNORMAL LOW (ref 13.0–17.7)
Immature Grans (Abs): 0 x10E3/uL (ref 0.0–0.1)
Immature Granulocytes: 0 %
Lymphocytes Absolute: 1.6 x10E3/uL (ref 0.7–3.1)
Lymphs: 31 %
MCH: 26.8 pg (ref 26.6–33.0)
MCHC: 32.1 g/dL (ref 31.5–35.7)
MCV: 84 fL (ref 79–97)
Monocytes Absolute: 0.6 x10E3/uL (ref 0.1–0.9)
Monocytes: 12 %
Neutrophils Absolute: 2.7 x10E3/uL (ref 1.4–7.0)
Neutrophils: 51 %
Platelets: 245 x10E3/uL (ref 150–450)
RBC: 4.44 x10E6/uL (ref 4.14–5.80)
RDW: 14.7 % (ref 11.6–15.4)
WBC: 5.2 x10E3/uL (ref 3.4–10.8)

## 2024-07-15 LAB — FERRITIN: Ferritin: 31 ng/mL (ref 30–400)

## 2024-07-15 LAB — IRON: Iron: 42 ug/dL (ref 38–169)

## 2024-07-15 NOTE — Progress Notes (Signed)
 Contacted via MyChart  Good afternoon Fernando Frederick, your labs have returned: - Kidney function has trended down a little more. Are you taking any Ibuprofen at home? Any new medications? Any recent imaging with contrast? I am going to place a referral to nephrology, the kidney specialist, for further assessment. We may need to adjust medications. - CBC continues to show slightly low hemoglobin and hematocrit, but this could be related to the kidney disease. We will monitor closely. Any questions? Keep being amazing!!  Thank you for allowing me to participate in your care.  I appreciate you. Kindest regards, Tanyon Alipio

## 2024-07-24 NOTE — Patient Instructions (Signed)
 Be Involved in Caring For Your Health:  Taking Medications When medications are taken as directed, they can greatly improve your health. But if they are not taken as prescribed, they may not work. In some cases, not taking them correctly can be harmful. To help ensure your treatment remains effective and safe, understand your medications and how to take them. Bring your medications to each visit for review by your provider.  Your lab results, notes, and after visit summary will be available on My Chart. We strongly encourage you to use this feature. If lab results are abnormal the clinic will contact you with the appropriate steps. If the clinic does not contact you assume the results are satisfactory. You can always view your results on My Chart. If you have questions regarding your health or results, please contact the clinic during office hours. You can also ask questions on My Chart.  We at [facility] are grateful that you chose [place] to provide your care. We strive to provide evidence-based and compassionate care and are always looking for feedback. If you get a survey from the clinic please complete this so we can hear your opinions.  Chronic Kidney Disease: Eating Plan Chronic kidney disease (CKD) is when your kidneys aren't working well. They can't remove waste, fluids, and other substances from your blood. When these substances build up, they can worsen kidney damage and affect your health. Eating certain foods can lead to a buildup of these substances. Changing your diet can help prevent more kidney damage. Diet changes may also delay dialysis or even keep you from needing it. What nutrients should I limit? Work with your health care team and an expert in healthy eating (dietitian) to make a meal plan that's right for you. Foods you can eat and foods you should limit or avoid will depend on: The stage of your kidney disease. Any other conditions you have. The items listed below  are not a complete list. Talk with a dietitian to learn what's best for you. Potassium Potassium affects how well your heart beats. Too much potassium in your blood can cause an irregular heartbeat or even a heart attack. You may need to limit foods that are high in potassium, such as: Liquid milk and soy milk. Salt substitutes that contain potassium. Fruits like: Bananas. Apricots. Melon. Prunes and raisins. Kiwi. Nectarines and oranges. Vegetables, such as: Potatoes, sweet potatoes, and yams. Tomatoes. Leafy greens. Beets. Avocado. Pumpkin and winter squash. Beans, like lima beans. Nuts. Phosphorus Phosphorus is a mineral found in your bones. You need a balance between calcium and phosphorus to build and maintain healthy bones.  Too much added phosphorus from the foods you eat can pull calcium from your bones. Losing calcium can make your bones weak and more likely to break. Too much phosphorus can also make your skin itch. You may need to limit foods that are high in phosphorus or that have added phosphorus, such as: Liquid milk and dairy products. Dark-colored sodas or soft drinks. Bran cereals and oatmeal. Protein  Protein helps your body make and keep muscle. Protein also helps to repair your body's cells and tissues.  One of the natural breakdown products of protein is a waste product called urea. When your kidneys aren't working well, they can't remove waste like urea. Reducing protein in your diet can help keep urea from building up in your blood. Depending on your stage of kidney disease, you may need to eat smaller portions of foods that  are high in protein. Sources of animal protein include: Meat (all types). Fish and seafood. Poultry. Eggs. Dairy. Other protein foods include: Beans and legumes. Nuts and nut butter. Soy, like tofu.  Sodium Salt (sodium) helps to keep a healthy balance of fluids in your body. Too much salt can increase your blood pressure,  which can harm your heart and lungs. Extra salt can also cause your body to keep too much fluid, making your kidneys work harder. You may need to limit or avoid foods that are high in salt, such as: Salt seasonings. Soy and teriyaki sauce. Meats that are: Packaged. Precooked. Cured. Processed. Salted crackers and snack foods. Fast food. Canned soups and foods. Pickled foods. Boxed mixes or ready-to-eat boxed meals and side dishes. Bottled dressings, sauces, and marinades. Talk with your dietitian about how much potassium, phosphorus, protein, and salt you may have each day. What are tips for following this plan? Reading food labels  Check the amount of salt in foods. Limit foods that have salt listed among the first five ingredients. Try to eat low-salt foods. Check the ingredient list for added phosphorus or potassium. "Phos" in an ingredient is a sign that phosphorus has been added. Do not buy foods that are calcium-enriched or that have calcium added to them (fortified). Buy canned vegetables and beans that say "no salt added." Rinse them before eating. Lifestyle Limit the amount of protein you eat from animal sources each day. Focus on protein from plant sources, like tofu and dried beans, peas, and lentils. Do not add salt to food when cooking or before eating. Do not eat star fruit. It can be toxic for people with kidney problems. Talk with your health care provider before taking any vitamin or mineral supplements. If told by your provider: Track how much liquid you have so you can avoid drinking too much. Try to eat foods that are made mostly from water, like gelatin, ice cream, soups, and juicy fruits and vegetables. If you have diabetes and chronic kidney disease: If you have diabetes and CKD, you need to keep your blood sugar (glucose) in the target range recommended by your provider. Follow your diabetes management plan. This may include: Checking your blood glucose  regularly. Taking medicines by mouth, or taking insulin, or both. Exercising for at least 30 minutes on 5 or more days each week, or as told by your provider. Tracking how many servings of carbohydrates you eat at each meal. Not using orange juice to treat low blood sugars. Instead, use apple juice, cranberry juice, or clear soda. You may be given guidelines on what foods and nutrients you may eat, and how much you can have each day. This depends on your stage of kidney disease and whether you have high blood pressure. Follow the meal plan your dietitian gives you. Where to find more information [employer] of Diabetes and Digestive and Kidney Diseases: [employer]Sync.si National Kidney Foundation: kidney.org This information is not intended to replace advice given to you by your health care provider. Make sure you discuss any questions you have with your health care provider. Document Revised: 03/25/2023 Document Reviewed: 03/25/2023 Elsevier Patient Education  2024 [employer].

## 2024-07-30 ENCOUNTER — Ambulatory Visit: Attending: Cardiology | Admitting: Cardiology

## 2024-07-30 ENCOUNTER — Encounter: Payer: Self-pay | Admitting: Cardiology

## 2024-07-30 VITALS — BP 115/59 | HR 81 | Ht 71.0 in | Wt 212.6 lb

## 2024-07-30 DIAGNOSIS — E782 Mixed hyperlipidemia: Secondary | ICD-10-CM | POA: Diagnosis not present

## 2024-07-30 DIAGNOSIS — I471 Supraventricular tachycardia, unspecified: Secondary | ICD-10-CM | POA: Diagnosis not present

## 2024-07-30 DIAGNOSIS — R011 Cardiac murmur, unspecified: Secondary | ICD-10-CM

## 2024-07-30 DIAGNOSIS — I10 Essential (primary) hypertension: Secondary | ICD-10-CM

## 2024-07-30 MED ORDER — METOPROLOL SUCCINATE ER 100 MG PO TB24: 100.0000 mg | ORAL_TABLET | Freq: Every day | ORAL | 3 refills | Status: AC

## 2024-07-30 NOTE — Progress Notes (Signed)
 Cardiology Office Note:    Date:  07/30/2024   ID:  Fernando Frederick, DOB 1956/03/30, MRN [id number]  PCP:  Valerio Melanie DASEN, NP   [facility] Providers Cardiologist:  None     Referring MD: Valerio Melanie DASEN, NP   Chief Complaint  Patient presents with   Follow-up    4 week follow up pt has been doing well with no complaints of chest pain, chest pressure or SOB, medciation reviewed verbally with patient    History of Present Illness:    Fernando Frederick is a 68 y.o. male with a hx of hypertension, hyperlipidemia, diabetes, SVT, CKD presenting for follow-up.  Previously seen for palpitations, cardiac monitor showed SVT episodes.  Echocardiogram was obtained to evaluate any structural abnormalities.  Toprol -XL 50 mg daily started.  He states still having occasional palpitations usually in the evening.  Denies dizziness, chest pain or shortness of breath.  Tolerating medications as prescribed.  Prior notes/testing Cardiac monitor obtained 06/25/2024 showed frequent SVT episodes, longest lasting 31 minutes, 58 seconds.   Past Medical History:  Diagnosis Date   Benign hypertension with chronic kidney disease 04/24/2015   Chronic kidney disease    CKD (chronic kidney disease), stage III (HCC) 04/24/2015   Diabetes (HCC) 04/24/2015   Diabetes mellitus without complication (HCC)    Gout 04/24/2015   Hyperlipidemia    Hypertension     Past Surgical History:  Procedure Laterality Date   COLONOSCOPY WITH PROPOFOL  N/A 12/08/2020   Procedure: COLONOSCOPY WITH PROPOFOL ;  Surgeon: Jinny Carmine, MD;  Location: [facility] ENDOSCOPY;  Service: Endoscopy;  Laterality: N/A;   COLONOSCOPY WITH PROPOFOL  N/A 02/20/2021   Procedure: COLONOSCOPY WITH PROPOFOL ;  Surgeon: Jinny Carmine, MD;  Location: [facility] ENDOSCOPY;  Service: Endoscopy;  Laterality: N/A;   COLONOSCOPY WITH PROPOFOL  N/A 06/11/2022   Procedure: COLONOSCOPY WITH PROPOFOL ;  Surgeon: Jinny Carmine, MD;  Location: [facility] ENDOSCOPY;  Service:  Endoscopy;  Laterality: N/A;   partial thumb amputation     PROSTATE SURGERY     Prostate Biopsy    Current Medications: Current Meds  Medication Sig   aspirin 81 MG tablet Take 81 mg by mouth daily.   Calcium -Magnesium-Vitamin D  (CALCIUM  1200+D3 PO) Take 1 tablet by mouth daily.   Cholecalciferol (VITAMIN D -3) 1000 UNITS CAPS Take 1,000 Units by mouth daily.   cyanocobalamin 2000 MCG tablet Take 2,000 mcg by mouth daily. One tablet daily   ezetimibe (ZETIA) 10 MG tablet Take 1 tablet (10 mg total) by mouth daily.   gatifloxacin (ZYMAXID) 0.5 % SOLN Place 1 drop into the left eye 4 (four) times daily.   ketorolac (ACULAR) 0.5 % ophthalmic solution Place 1 drop into the left eye 2 (two) times daily.   lisinopril -hydrochlorothiazide  (ZESTORETIC ) 20-12.5 MG tablet Take 1 tablet by mouth daily.   metFORMIN  (GLUCOPHAGE ) 500 MG tablet Take 1 tablet (500 mg total) by mouth 2 (two) times daily with a meal.   Multiple Vitamin (MULTIVITAMIN) tablet Take 1 tablet by mouth daily.   Omega-3 Fatty Acids (FISH OIL) 1000 MG CAPS Take 1,000 mg by mouth daily.   prednisoLONE acetate (PRED FORTE) 1 % ophthalmic suspension SMARTSIG:In Eye(s)   rosuvastatin  (CRESTOR ) 40 MG tablet Take 1 tablet (40 mg total) by mouth daily.   [DISCONTINUED] amLODipine  (NORVASC ) 5 MG tablet Take 1 tablet (5 mg total) by mouth daily.   [DISCONTINUED] metoprolol  succinate (TOPROL -XL) 50 MG 24 hr tablet Take 1 tablet (50 mg total) by mouth daily. Take with or immediately  following a meal.     Allergies:   Patient has no known allergies.   Social History   Socioeconomic History   Marital status: Married    Spouse name: Romero   Number of children: 3   Years of education: Not on file   Highest education level: Not on file  Occupational History   Occupation: retired  Tobacco Use   Smoking status: Former    Current packs/day: 0.00    Average packs/day: 0.3 packs/day for 40.0 years (10.0 ttl pk-yrs)    Types: Cigarettes     Start date: 1966    Quit date: 08/26/2004    Years since quitting: 19.9    Passive exposure: Past   Smokeless tobacco: Never  Vaping Use   Vaping status: Never Used  Substance and Sexual Activity   Alcohol use: Yes    Alcohol/week: 6.0 standard drinks of alcohol    Types: 6 Cans of beer per week    Comment: drinks 2 beers 3 days per week   Drug use: No   Sexual activity: Yes  Other Topics Concern   Not on file  Social History Narrative   Married, 3 sons-2 living and 1 deceased.   Social Drivers of Corporate Investment Banker Strain: Low Risk  (06/10/2023)   Overall Financial Resource Strain (CARDIA)    Difficulty of Paying Living Expenses: Not hard at all  Food Insecurity: No Food Insecurity (06/10/2023)   Hunger Vital Sign    Worried About Running Out of Food in the Last Year: Never true    Ran Out of Food in the Last Year: Never true  Transportation Needs: No Transportation Needs (06/10/2023)   PRAPARE - Administrator, Civil Service (Medical): No    Lack of Transportation (Non-Medical): No  Physical Activity: Insufficiently Active (06/10/2023)   Exercise Vital Sign    Days of Exercise per Week: 3 days    Minutes of Exercise per Session: 30 min  Stress: No Stress Concern Present (06/10/2023)   [employer] of Occupational Health - Occupational Stress Questionnaire    Feeling of Stress : Not at all  Social Connections: Moderately Integrated (06/10/2023)   Social Connection and Isolation Panel    Frequency of Communication with Friends and Family: More than three times a week    Frequency of Social Gatherings with Friends and Family: More than three times a week    Attends Religious Services: More than 4 times per year    Active Member of [employer] or Organizations: No    Attends Engineer, Structural: Never    Marital Status: Married     Family History: The patient's family history includes Cancer in his mother; Diabetes in his father;  Hypertension in his father, mother, sister, sister, and sister; Kidney disease in his brother and father.  ROS:   Please see the history of present illness.     All other systems reviewed and are negative.  EKGs/Labs/Other Studies Reviewed:    The following studies were reviewed today:       Recent Labs: 06/10/2024: ALT 13; TSH 0.903 07/14/2024: BUN 27; Creatinine, Ser 2.27; Hemoglobin 11.9; Platelets 245; Potassium 4.8; Sodium 142  Recent Lipid Panel    Component Value Date/Time   CHOL 143 06/10/2024 0923   CHOL 147 07/30/2019 1449   TRIG 206 (H) 06/10/2024 0923   TRIG 189 (H) 07/30/2019 1449   HDL 40 06/10/2024 0923   CHOLHDL 3.4 06/05/2018 1550   VLDL  38 (H) 07/30/2019 1449   LDLCALC 69 06/10/2024 0923     Risk Assessment/Calculations:             Physical Exam:    VS:  BP (!) 115/59 (BP Location: Left Arm, Patient Position: Sitting, Cuff Size: Normal)   Pulse 81   Ht 5' 11 (1.803 m)   Wt 212 lb 9.6 oz (96.4 kg)   SpO2 98%   BMI 29.65 kg/m     Wt Readings from Last 3 Encounters:  07/30/24 212 lb 9.6 oz (96.4 kg)  07/01/24 216 lb 9.6 oz (98.2 kg)  06/10/24 213 lb 6.4 oz (96.8 kg)     GEN:  Well nourished, well developed in no acute distress HEENT: Normal NECK: No JVD; No carotid bruits CARDIAC: RRR, 2/6 systolic murmur loudest at apex. RESPIRATORY:  Clear to auscultation without rales, wheezing or rhonchi  ABDOMEN: Soft, non-tender, non-distended MUSCULOSKELETAL:  No edema; No deformity  SKIN: Warm and dry NEUROLOGIC:  Alert and oriented x 3 PSYCHIATRIC:  Normal affect   ASSESSMENT:    1. SVT (supraventricular tachycardia)   2. Primary hypertension   3. Mixed hyperlipidemia   4. Systolic murmur    PLAN:    In order of problems listed above:  SVT, palpitations.  Echo 06/2024 EF 55-60, aortic valve sclerosis, mild MR. Increase Toprol -XL to 100 mg daily. Hypertension, BP controlled.  Stop Norvasc  with increasing Toprol -XL to 100 mg daily.   Continue lisinopril -HCTZ 20-12.5 mg daily. Hyperlipidemia, continue Crestor  40 mg daily, Zetia 10 mg daily. Systolic murmur, aortic valve sclerosis likely reason for murmur, no significant stenosis.  Follow-up in 1 year.     Medication Adjustments/Labs and Tests Ordered: Current medicines are reviewed at length with the patient today.  Concerns regarding medicines are outlined above.  No orders of the defined types were placed in this encounter.  Meds ordered this encounter  Medications   metoprolol  succinate (TOPROL -XL) 100 MG 24 hr tablet    Sig: Take 1 tablet (100 mg total) by mouth daily. Take with or immediately following a meal.    Dispense:  90 tablet    Refill:  3    Patient Instructions  Medication Instructions:  - INCREASE toprol  xl to 100 mg daily - STOP amlodipine    *If you need a refill on your cardiac medications before your next appointment, please call your pharmacy*  Lab Work: No labs ordered today  If you have labs (blood work) drawn today and your tests are completely normal, you will receive your results only by: MyChart Message (if you have MyChart) OR A paper copy in the mail If you have any lab test that is abnormal or we need to change your treatment, we will call you to review the results.  Testing/Procedures: No test ordered today   Follow-Up: At [facility], you and your health needs are our priority.  As part of our continuing mission to provide you with exceptional heart care, our providers are all part of one team.  This team includes your primary Cardiologist (physician) and Advanced Practice Providers or APPs (Physician Assistants and Nurse Practitioners) who all work together to provide you with the care you need, when you need it.  Your next appointment:   3 month(s)  Provider:   You may see Dr Darliss or one of the following Advanced Practice Providers on your designated Care Team:   Lonni Meager, NP Lesley Maffucci,  PA-C Bernardino Bring, PA-C Cadence [place], PA-C [place],  NP Barnie Hila, NP    We recommend signing up for the patient portal called MyChart.  Sign up information is provided on this After Visit Summary.  MyChart is used to connect with patients for Virtual Visits (Telemedicine).  Patients are able to view lab/test results, encounter notes, upcoming appointments, etc.  Non-urgent messages can be sent to your provider as well.   To learn more about what you can do with MyChart, go to [email or web address].              Signed, Redell Cave, MD  07/30/2024 10:46 AM    [facility]

## 2024-07-30 NOTE — Patient Instructions (Signed)
 Medication Instructions:  - INCREASE toprol  xl to 100 mg daily - STOP amlodipine    *If you need a refill on your cardiac medications before your next appointment, please call your pharmacy*  Lab Work: No labs ordered today  If you have labs (blood work) drawn today and your tests are completely normal, you will receive your results only by: MyChart Message (if you have MyChart) OR A paper copy in the mail If you have any lab test that is abnormal or we need to change your treatment, we will call you to review the results.  Testing/Procedures: No test ordered today   Follow-Up: At [facility], you and your health needs are our priority.  As part of our continuing mission to provide you with exceptional heart care, our providers are all part of one team.  This team includes your primary Cardiologist (physician) and Advanced Practice Providers or APPs (Physician Assistants and Nurse Practitioners) who all work together to provide you with the care you need, when you need it.  Your next appointment:   3 month(s)  Provider:   You may see Dr Darliss or one of the following Advanced Practice Providers on your designated Care Team:   Lonni Meager, NP Lesley Maffucci, PA-C Bernardino Bring, PA-C Cadence [place], PA-C Tylene Lunch, NP Barnie Hila, NP    We recommend signing up for the patient portal called MyChart.  Sign up information is provided on this After Visit Summary.  MyChart is used to connect with patients for Virtual Visits (Telemedicine).  Patients are able to view lab/test results, encounter notes, upcoming appointments, etc.  Non-urgent messages can be sent to your provider as well.   To learn more about what you can do with MyChart, go to [email or web address].

## 2024-08-04 ENCOUNTER — Encounter: Payer: Self-pay | Admitting: Nurse Practitioner

## 2024-08-04 ENCOUNTER — Ambulatory Visit (INDEPENDENT_AMBULATORY_CARE_PROVIDER_SITE_OTHER): Admitting: Nurse Practitioner

## 2024-08-04 VITALS — BP 124/77 | HR 67 | Temp 98.6°F | Resp 15 | Ht 70.98 in | Wt 210.2 lb

## 2024-08-04 DIAGNOSIS — I471 Supraventricular tachycardia, unspecified: Secondary | ICD-10-CM

## 2024-08-04 DIAGNOSIS — R002 Palpitations: Secondary | ICD-10-CM

## 2024-08-04 DIAGNOSIS — N1832 Chronic kidney disease, stage 3b: Secondary | ICD-10-CM | POA: Diagnosis not present

## 2024-08-04 DIAGNOSIS — R011 Cardiac murmur, unspecified: Secondary | ICD-10-CM | POA: Diagnosis not present

## 2024-08-04 NOTE — Progress Notes (Addendum)
 BP 124/77 (BP Location: Left Arm, Patient Position: Sitting, Cuff Size: Normal)   Pulse 67   Temp 98.6 F (37 C) (Oral)   Resp 15   Ht 5' 10.98 (1.803 m)   Wt 210 lb 3.2 oz (95.3 kg)   SpO2 97%   BMI 29.33 kg/m    Subjective:    Patient ID: Fernando Frederick, male    DOB: Jun 15, 1956, 68 y.o.   MRN: [id number]  HPI: Fernando Frederick is a 68 y.o. male  Chief Complaint  Patient presents with   Palpitations    Cardiology stopped amlodipine  and started metoprolol  which he feels has helped his flutters he was having.     PALPITATIONS Follow-up today for palpitations. Initially presented with this concern at visit on 06/10/24. Zio monitor did note frequent episodes of SVT. EF on echo 55 to 60% with mild LVH. Has been seen by cardiology, last visit on 07/30/24. They stopped his Amlodipine  and initiated Toprol , which they increased at recent visit to 100 MG daily. He feels flutters are feeling better with this on board. He is tolerating Toprol  well without ADR. Underlying CKD, recent labs did note a slight trend down. Scheduled to see nephrology for initial visit in January 2026. Duration: months Symptom description: flutters Duration of episode: minutes Frequency: recurrent, no recent episodes Activity when event occurred: varies Related to exertion: no Dyspnea: no Chest pain: no Syncope: no Anxiety/stress: no Nausea/vomiting: no Diaphoresis: no Coronary artery disease: yes Congestive heart failure: no Arrhythmia:no Thyroid  disease: no Caffeine intake: occasional, but not often Status:  better Treatments attempted: as above   Relevant past medical, surgical, family and social history reviewed and updated as indicated. Interim medical history since our last visit reviewed. Allergies and medications reviewed and updated.  Review of Systems  Constitutional:  Negative for activity change, diaphoresis, fatigue and fever.  Respiratory:  Negative for cough, chest tightness,  shortness of breath and wheezing.   Cardiovascular:  Negative for chest pain, palpitations and leg swelling.  Gastrointestinal: Negative.   Endocrine: Negative for cold intolerance, heat intolerance, polydipsia, polyphagia and polyuria.  Neurological: Negative.   Psychiatric/Behavioral: Negative.      Per HPI unless specifically indicated above     Objective:    BP 124/77 (BP Location: Left Arm, Patient Position: Sitting, Cuff Size: Normal)   Pulse 67   Temp 98.6 F (37 C) (Oral)   Resp 15   Ht 5' 10.98 (1.803 m)   Wt 210 lb 3.2 oz (95.3 kg)   SpO2 97%   BMI 29.33 kg/m   Wt Readings from Last 3 Encounters:  08/04/24 210 lb 3.2 oz (95.3 kg)  07/30/24 212 lb 9.6 oz (96.4 kg)  07/01/24 216 lb 9.6 oz (98.2 kg)    Physical Exam Vitals and nursing note reviewed.  Constitutional:      General: He is awake. He is not in acute distress.    Appearance: He is well-developed and well-groomed. He is not ill-appearing or toxic-appearing.  HENT:     Head: Normocephalic.     Right Ear: Hearing and external ear normal.     Left Ear: Hearing and external ear normal.  Eyes:     General: Lids are normal.     Extraocular Movements: Extraocular movements intact.     Conjunctiva/sclera: Conjunctivae normal.  Neck:     Thyroid : No thyromegaly.     Vascular: No carotid bruit.  Cardiovascular:     Rate and Rhythm: Normal rate and  regular rhythm.     Heart sounds: Murmur heard.     Systolic murmur is present with a grade of 2/6.     No gallop.  Pulmonary:     Effort: No accessory muscle usage or respiratory distress.     Breath sounds: Normal breath sounds.  Abdominal:     General: Bowel sounds are normal. There is no distension.     Palpations: Abdomen is soft.     Tenderness: There is no abdominal tenderness.  Musculoskeletal:     Cervical back: Full passive range of motion without pain.     Right lower leg: No edema.     Left lower leg: No edema.  Lymphadenopathy:     Cervical:  No cervical adenopathy.  Skin:    General: Skin is warm.     Capillary Refill: Capillary refill takes less than 2 seconds.  Neurological:     Mental Status: He is alert and oriented to person, place, and time.     Deep Tendon Reflexes: Reflexes are normal and symmetric.     Reflex Scores:      Brachioradialis reflexes are 2+ on the right side and 2+ on the left side.      Patellar reflexes are 2+ on the right side and 2+ on the left side. Psychiatric:        Attention and Perception: Attention normal.        Mood and Affect: Mood normal.        Speech: Speech normal.        Behavior: Behavior normal. Behavior is cooperative.        Thought Content: Thought content normal.     Results for orders placed or performed in visit on 07/14/24  Iron   Collection Time: 07/14/24  4:00 PM  Result Value Ref Range   Iron 42 38 - 169 ug/dL  Ferritin   Collection Time: 07/14/24  4:00 PM  Result Value Ref Range   Ferritin 31 30 - 400 ng/mL  CBC with Differential/Platelet   Collection Time: 07/14/24  4:00 PM  Result Value Ref Range   WBC 5.2 3.4 - 10.8 x10E3/uL   RBC 4.44 4.14 - 5.80 x10E6/uL   Hemoglobin 11.9 (L) 13.0 - 17.7 g/dL   Hematocrit 62.8 (L) 62.4 - 51.0 %   MCV 84 79 - 97 fL   MCH 26.8 26.6 - 33.0 pg   MCHC 32.1 31.5 - 35.7 g/dL   RDW 85.2 88.3 - 84.5 %   Platelets 245 150 - 450 x10E3/uL   Neutrophils 51 Not Estab. %   Lymphs 31 Not Estab. %   Monocytes 12 Not Estab. %   Eos 5 Not Estab. %   Basos 1 Not Estab. %   Neutrophils Absolute 2.7 1.4 - 7.0 x10E3/uL   Lymphocytes Absolute 1.6 0.7 - 3.1 x10E3/uL   Monocytes Absolute 0.6 0.1 - 0.9 x10E3/uL   EOS (ABSOLUTE) 0.3 0.0 - 0.4 x10E3/uL   Basophils Absolute 0.0 0.0 - 0.2 x10E3/uL   Immature Granulocytes 0 Not Estab. %   Immature Grans (Abs) 0.0 0.0 - 0.1 x10E3/uL  Basic metabolic panel with GFR   Collection Time: 07/14/24  4:00 PM  Result Value Ref Range   Glucose 97 70 - 99 mg/dL   BUN 27 8 - 27 mg/dL   Creatinine,  Ser 7.72 (H) 0.76 - 1.27 mg/dL   eGFR 30 (L) >40 fO/fpw/8.26   BUN/Creatinine Ratio 12 10 - 24   Sodium 142 134 -  144 mmol/L   Potassium 4.8 3.5 - 5.2 mmol/L   Chloride 107 (H) 96 - 106 mmol/L   CO2 19 (L) 20 - 29 mmol/L   Calcium  9.7 8.6 - 10.2 mg/dL      Assessment & Plan:   Problem List Items Addressed This Visit       Cardiovascular and Mediastinum   SVT (supraventricular tachycardia)   Improving with Toprol  on board, will continue this as is tolerating. Continue to collaborate with cardiology, recent notes reviewed.        Genitourinary   Chronic kidney disease, stage 3b (HCC) - Primary   Chronic, ongoing. Continue Lisinopril  for kidney protection. BMP today, urine ALB 80 March 2025. Scheduled to see nephrology in January 2026, appreciate their input.  Could consider discontinuation of HCTZ if further decline noted and then increase Lisinopril  as needed for BP control.  Also could consider change from Metformin  to Farxiga in future, which discussed with patient.      Relevant Orders   Basic metabolic panel with GFR     Other   Heart murmur, systolic   Suspect related to aortic sclerosis, no stenosis on echo. Continue to monitor and repeat echo as needed in future if symptoms present.        Follow up plan: Return in about 4 months (around 12/13/2024) for T2DM, HTN/HLD, PROSTATE CA.

## 2024-08-04 NOTE — Assessment & Plan Note (Addendum)
 Chronic, ongoing. Continue Lisinopril  for kidney protection. BMP today, urine ALB 80 March 2025. Scheduled to see nephrology in January 2026, appreciate their input.  Could consider discontinuation of HCTZ if further decline noted and then increase Lisinopril  as needed for BP control.  Also could consider change from Metformin  to Farxiga in future, which discussed with patient.

## 2024-08-04 NOTE — Assessment & Plan Note (Signed)
 Suspect related to aortic sclerosis, no stenosis on echo. Continue to monitor and repeat echo as needed in future if symptoms present.

## 2024-08-04 NOTE — Assessment & Plan Note (Signed)
 Improving with Toprol  on board, will continue this as is tolerating. Continue to collaborate with cardiology, recent notes reviewed.

## 2024-08-05 ENCOUNTER — Ambulatory Visit: Payer: Self-pay | Admitting: Nurse Practitioner

## 2024-08-05 LAB — BASIC METABOLIC PANEL WITH GFR
BUN/Creatinine Ratio: 13 (ref 10–24)
BUN: 26 mg/dL (ref 8–27)
CO2: 22 mmol/L (ref 20–29)
Calcium: 9.7 mg/dL (ref 8.6–10.2)
Chloride: 109 mmol/L — ABNORMAL HIGH (ref 96–106)
Creatinine, Ser: 2.04 mg/dL — ABNORMAL HIGH (ref 0.76–1.27)
Glucose: 113 mg/dL — ABNORMAL HIGH (ref 70–99)
Potassium: 4.4 mmol/L (ref 3.5–5.2)
Sodium: 145 mmol/L — ABNORMAL HIGH (ref 134–144)
eGFR: 34 mL/min/1.73 — ABNORMAL LOW (ref >59)

## 2024-08-05 NOTE — Progress Notes (Signed)
 Contacted via MyChart  Good evening Fernando Frederick, your labs have returned. Kidney function continues to show Kidney disease Stage 3b, maintain upcoming visit with kidney doctor for further recommendations and continue all current medications. Salt level is a little elevated, reduce salt intake and drink more water at home. Any questions? Keep being awesome!!  Thank you for allowing me to participate in your care.  I appreciate you. Kindest regards, [name]

## 2024-08-10 ENCOUNTER — Ambulatory Visit

## 2024-08-10 VITALS — Ht 71.0 in | Wt 212.0 lb

## 2024-08-10 DIAGNOSIS — Z0000 Encounter for general adult medical examination without abnormal findings: Secondary | ICD-10-CM | POA: Diagnosis not present

## 2024-08-10 NOTE — Patient Instructions (Signed)
 Mr. Weekley,  Thank you for taking the time for your Medicare Wellness Visit. I appreciate your continued commitment to your health goals. Please review the care plan we discussed, and feel free to reach out if I can assist you further.  Please note that Annual Wellness Visits do not include a physical exam. Some assessments may be limited, especially if the visit was conducted virtually. If needed, we may recommend an in-person follow-up with your provider.  Ongoing Care Seeing your primary care provider every 3 to 6 months helps us  monitor your health and provide consistent, personalized care.   Referrals If a referral was made during today's visit and you haven't received any updates within two weeks, please contact the referred provider directly to check on the status.  Recommended Screenings:  You may get a covid vaccine at your doctor's office or at your local pharmacy at your convenience.  Get a diabetic eye exam as scheduled on 08/24/24 with Dr. Manus Edison   Health Maintenance  Topic Date Due   Eye exam for diabetics  12/03/2023   COVID-19 Vaccine (5 - 2025-26 season) 04/26/2024   Medicare Annual Wellness Visit  06/09/2024   Yearly kidney health urinalysis for diabetes  11/09/2024   Hemoglobin A1C  12/09/2024   Complete foot exam   06/10/2025   Colon Cancer Screening  06/11/2025   Yearly kidney function blood test for diabetes  08/04/2025   DTaP/Tdap/Td vaccine (4 - Td or Tdap) 11/03/2032   Pneumococcal Vaccine for age over 91  Completed   Flu Shot  Completed   Hepatitis C Screening  Completed   Zoster (Shingles) Vaccine  Completed   Meningitis B Vaccine  Aged Out       08/10/2024    8:48 AM  Advanced Directives  Does Patient Have a Medical Advance Directive? Yes  Type of Estate Agent of [place];Living will  Does patient want to make changes to medical advance directive? No - Patient declined  Copy of Healthcare Power of Attorney in Chart?  Yes - validated most recent copy scanned in chart (See row information)    Vision: Annual vision screenings are recommended for early detection of glaucoma, cataracts, and diabetic retinopathy. These exams can also reveal signs of chronic conditions such as diabetes and high blood pressure.  Dental: Annual dental screenings help detect early signs of oral cancer, gum disease, and other conditions linked to overall health, including heart disease and diabetes.  Please see the attached documents for additional preventive care recommendations.

## 2024-08-10 NOTE — Progress Notes (Signed)
 Chief Complaint  Patient presents with   Medicare Wellness     Subjective:   Fernando Frederick is a 68 y.o. male who presents for a Medicare Annual Wellness Visit.  Visit info / Clinical Intake: Medicare Wellness Visit Type:: Subsequent Annual Wellness Visit Persons participating in visit and providing information:: patient Medicare Wellness Visit Mode:: Telephone If telephone:: video declined Since this visit was completed virtually, some vitals may be partially provided or unavailable. Missing vitals are due to the limitations of the virtual format.: Documented vitals are patient reported If Telephone or Video please confirm:: I connected with patient using audio/video enable telemedicine. I verified patient identity with two identifiers, discussed telehealth limitations, and patient agreed to proceed. Patient Location:: home Provider Location:: clinic office Interpreter Needed?: No Pre-visit prep was completed: yes AWV questionnaire completed by patient prior to visit?: no Living arrangements:: (!) lives alone Patient's Overall Health Status Rating: good Typical amount of pain: none Does pain affect daily life?: no Are you currently prescribed opioids?: no  Dietary Habits and Nutritional Risks How many meals a day?: 2 Eats fruit and vegetables daily?: (!) no Most meals are obtained by: preparing own meals In the last 2 weeks, have you had any of the following?: none Diabetic:: (!) yes Any non-healing wounds?: no How often do you check your BS?: as needed (once a week) Would you like to be referred to a Nutritionist or for Diabetic Management? : no  Functional Status Activities of Daily Living (to include ambulation/medication): Independent Ambulation: Independent with device- listed below Home Assistive Devices/Equipment: Eyeglasses Medication Administration: Independent Home Management (perform basic housework or laundry): Independent Manage your own finances?:  yes Primary transportation is: driving Concerns about vision?: (!) yes (cataract surgery scheduled 08/23/24) Concerns about hearing?: no  Fall Screening Falls in the past year?: 0 Number of falls in past year: 0 Was there an injury with Fall?: 0 Fall Risk Category Calculator: 0 Patient Fall Risk Level: Low Fall Risk  Fall Risk Patient at Risk for Falls Due to: No Fall Risks Fall risk Follow up: Falls evaluation completed  Home and Transportation Safety: All rugs have non-skid backing?: yes All stairs or steps have railings?: yes Grab bars in the bathtub or shower?: (!) no Have non-skid surface in bathtub or shower?: yes Good home lighting?: yes Regular seat belt use?: yes Hospital stays in the last year:: no  Cognitive Assessment Difficulty concentrating, remembering, or making decisions? : no Will 6CIT or Mini Cog be Completed: yes What year is it?: 0 points What month is it?: 0 points Give patient an address phrase to remember (5 components): [address] [place] About what time is it?: 0 points Count backwards from 20 to 1: 0 points Say the months of the year in reverse: 0 points Repeat the address phrase from earlier: 0 points 6 CIT Score: 0 points  Advance Directives (For Healthcare) Does Patient Have a Medical Advance Directive?: Yes Does patient want to make changes to medical advance directive?: No - Patient declined Type of Advance Directive: Healthcare Power of [place]; Living will Copy of Healthcare Power of Attorney in Chart?: Yes - validated most recent copy scanned in chart (See row information) Copy of Living Will in Chart?: Yes - validated most recent copy scanned in chart (See row information) Would patient like information on creating a medical advance directive?: No - Patient declined  Reviewed/Updated  Reviewed/Updated: Reviewed All (Medical, Surgical, Family, Medications, Allergies, Care Teams, Patient Goals)  Allergies (verified) Patient  has no known allergies.   Current Medications (verified) Outpatient Encounter Medications as of 08/10/2024  Medication Sig   aspirin 81 MG tablet Take 81 mg by mouth daily.   Calcium -Magnesium-Vitamin D  (CALCIUM  1200+D3 PO) Take 1 tablet by mouth daily.   Cholecalciferol (VITAMIN D -3) 1000 UNITS CAPS Take 1,000 Units by mouth daily.   cyanocobalamin 2000 MCG tablet Take 2,000 mcg by mouth daily. One tablet daily   ezetimibe  (ZETIA ) 10 MG tablet Take 1 tablet (10 mg total) by mouth daily.   gatifloxacin (ZYMAXID) 0.5 % SOLN Place 1 drop into the left eye 4 (four) times daily. (Patient taking differently: Place 1 drop into the left eye 4 (four) times daily. Will start 3 days before surgery)   ketorolac (ACULAR) 0.5 % ophthalmic solution Place 1 drop into the left eye 2 (two) times daily. (Patient taking differently: Place 1 drop into the left eye 2 (two) times daily. Will start 3 days prior to surgery)   lisinopril -hydrochlorothiazide  (ZESTORETIC ) 20-12.5 MG tablet Take 1 tablet by mouth daily.   metFORMIN  (GLUCOPHAGE ) 500 MG tablet Take 1 tablet (500 mg total) by mouth 2 (two) times daily with a meal.   metoprolol  succinate (TOPROL -XL) 100 MG 24 hr tablet Take 1 tablet (100 mg total) by mouth daily. Take with or immediately following a meal.   Multiple Vitamin (MULTIVITAMIN) tablet Take 1 tablet by mouth daily.   Omega-3 Fatty Acids (FISH OIL) 1000 MG CAPS Take 1,000 mg by mouth daily.   prednisoLONE acetate (PRED FORTE) 1 % ophthalmic suspension SMARTSIG:In Eye(s) (Patient taking differently: Will start taking 3 days prior to surgery)   rosuvastatin  (CRESTOR ) 40 MG tablet Take 1 tablet (40 mg total) by mouth daily.   No facility-administered encounter medications on file as of 08/10/2024.    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   Chronic kidney disease    CKD (chronic kidney disease), stage III (HCC) 04/24/2015    Diabetes (HCC) 04/24/2015   Diabetes mellitus without complication (HCC)    Gout 04/24/2015   Hyperlipidemia    Hypertension    Past Surgical History:  Procedure Laterality Date   COLONOSCOPY WITH PROPOFOL  N/A 12/08/2020   Procedure: COLONOSCOPY WITH PROPOFOL ;  Surgeon: Jinny Carmine, MD;  Location: [facility] ENDOSCOPY;  Service: Endoscopy;  Laterality: N/A;   COLONOSCOPY WITH PROPOFOL  N/A 02/20/2021   Procedure: COLONOSCOPY WITH PROPOFOL ;  Surgeon: Jinny Carmine, MD;  Location: [facility] ENDOSCOPY;  Service: Endoscopy;  Laterality: N/A;   COLONOSCOPY WITH PROPOFOL  N/A 06/11/2022   Procedure: COLONOSCOPY WITH PROPOFOL ;  Surgeon: Jinny Carmine, MD;  Location: [facility] ENDOSCOPY;  Service: Endoscopy;  Laterality: N/A;   partial thumb amputation     PROSTATE SURGERY     Prostate Biopsy   Family History  Problem Relation Age of Onset   Cancer Mother        lung   Hypertension Mother    Diabetes Father    Hypertension Father    Kidney disease Father    Hypertension Sister    Hypertension Sister    Hypertension Sister    Kidney disease Brother    Social History   Occupational History   Occupation: retired  Tobacco Use   Smoking status: Former    Current packs/day: 0.00    Average packs/day: 0.3 packs/day for 40.0 years (10.0 ttl pk-yrs)    Types: Cigarettes    Start date: 1966    Quit date: 08/26/2004  Years since quitting: 19.9    Passive exposure: Past   Smokeless tobacco: Never  Vaping Use   Vaping status: Never Used  Substance and Sexual Activity   Alcohol use: Yes    Alcohol/week: 6.0 standard drinks of alcohol    Types: 6 Cans of beer per week    Comment: drinks 2 beers 3 days per week   Drug use: No   Sexual activity: Yes   Tobacco Counseling Counseling given: Not Answered  SDOH Screenings   Food Insecurity: No Food Insecurity (08/10/2024)  Housing: Low Risk (08/10/2024)  Transportation Needs: No Transportation Needs (08/10/2024)  Utilities: Not At Risk (08/10/2024)   Alcohol Screen: Low Risk (06/10/2023)  Depression (PHQ2-9): Low Risk (08/10/2024)  Financial Resource Strain: Low Risk (06/10/2023)  Physical Activity: Inactive (08/10/2024)  Social Connections: Moderately Isolated (08/10/2024)  Stress: No Stress Concern Present (08/10/2024)  Tobacco Use: Medium Risk (08/10/2024)  Health Literacy: Adequate Health Literacy (08/10/2024)   See flowsheets for full screening details  Depression Screen PHQ 2 & 9 Depression Scale- Over the past 2 weeks, how often have you been bothered by any of the following problems? Little interest or pleasure in doing things: 0 Feeling down, depressed, or hopeless (PHQ Adolescent also includes...irritable): 0 PHQ-2 Total Score: 0 Trouble falling or staying asleep, or sleeping too much: 0 Feeling tired or having little energy: 0 Poor appetite or overeating (PHQ Adolescent also includes...weight loss): 0 Feeling bad about yourself - or that you are a failure or have let yourself or your family down: 0 Trouble concentrating on things, such as reading the newspaper or watching television (PHQ Adolescent also includes...like school work): 0 Moving or speaking so slowly that other people could have noticed. Or the opposite - being so fidgety or restless that you have been moving around a lot more than usual: 0 Thoughts that you would be better off dead, or of hurting yourself in some way: 0 PHQ-9 Total Score: 0 If you checked off any problems, how difficult have these problems made it for you to do your work, take care of things at home, or get along with other people?: Not difficult at all  Depression Treatment Depression Interventions/Treatment : EYV7-0 Score <4 Follow-up Not Indicated     Goals Addressed             This Visit's Progress    Patient Stated   Not on track    Continue to drink water and eat healthy             Objective:    Today's Vitals   08/10/24 0844  Weight: 212 lb (96.2 kg)  Height:  5' 11 (1.803 m)   Body mass index is 29.57 kg/m.  Hearing/Vision screen Hearing Screening - Comments:: Denies hearing loss  Vision Screening - Comments:: Has DM eye exam scheduled for 08/24/24 with Dr. Manus Edison Immunizations and Health Maintenance Health Maintenance  Topic Date Due   OPHTHALMOLOGY EXAM  12/03/2023   COVID-19 Vaccine (5 - 2025-26 season) 04/26/2024   Diabetic kidney evaluation - Urine ACR  11/09/2024   HEMOGLOBIN A1C  12/09/2024   FOOT EXAM  06/10/2025   Colonoscopy  06/11/2025   Diabetic kidney evaluation - eGFR measurement  08/04/2025   Medicare Annual Wellness (AWV)  08/10/2025   DTaP/Tdap/Td (4 - Td or Tdap) 11/03/2032   Pneumococcal Vaccine: 50+ Years  Completed   Influenza Vaccine  Completed   Hepatitis C Screening  Completed   Zoster Vaccines- Shingrix   Completed  Meningococcal B Vaccine  Aged Out        Assessment/Plan:  This is a routine wellness examination for [place].  Patient Care Team: Cannady, Jolene T, NP as PCP - General (Nurse Practitioner) Lenn Aran, MD as Consulting Physician (Radiation Oncology) [facility] (Ophthalmology) Jinny Carmine, MD as Consulting Physician (Gastroenterology) Darliss Rogue, MD as Consulting Physician (Cardiology) Georganne Penne SAUNDERS, MD as Consulting Physician (Urology) Douglas Rule, MD as Consulting Physician (Nephrology)  I have personally reviewed and noted the following in the patients chart:   Medical and social history Use of alcohol, tobacco or illicit drugs  Current medications and supplements including opioid prescriptions. Functional ability and status Nutritional status Physical activity Advanced directives List of other physicians Hospitalizations, surgeries, and ER visits in previous 12 months Vitals Screenings to include cognitive, depression, and falls Referrals and appointments  No orders of the defined types were placed in this encounter.  In  addition, I have reviewed and discussed with patient certain preventive protocols, quality metrics, and best practice recommendations. A written personalized care plan for preventive services as well as general preventive health recommendations were provided to patient.   Vina Ned, CMA   08/10/2024   Return in 1 year (on 08/11/2025) for Medicare Annual Wellness Visit.  After Visit Summary: (MyChart) Due to this being a telephonic visit, the after visit summary with patients personalized plan was offered to patient via MyChart   Nurse Notes:  Has a DM eye exam scheduled 08/24/24 with Dr. Manus Edison Plans to get a Covid vaccine Declined DM & Nutrition education referral

## 2024-08-25 ENCOUNTER — Other Ambulatory Visit: Payer: Self-pay

## 2024-08-25 DIAGNOSIS — R972 Elevated prostate specific antigen [PSA]: Secondary | ICD-10-CM

## 2024-08-25 DIAGNOSIS — C61 Malignant neoplasm of prostate: Secondary | ICD-10-CM

## 2024-08-27 ENCOUNTER — Other Ambulatory Visit: Payer: Self-pay

## 2024-08-27 DIAGNOSIS — R972 Elevated prostate specific antigen [PSA]: Secondary | ICD-10-CM

## 2024-08-27 DIAGNOSIS — C61 Malignant neoplasm of prostate: Secondary | ICD-10-CM

## 2024-08-28 LAB — PSA: Prostate Specific Ag, Serum: 0.2 ng/mL (ref 0.0–4.0)

## 2024-08-31 ENCOUNTER — Ambulatory Visit: Payer: Self-pay | Admitting: Urology

## 2024-08-31 NOTE — Assessment & Plan Note (Addendum)
 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  Most recent PSA is 0.2 (08/27/24) which is stable from prior.  This is reassuring and supports continued disease remission.  Would continue to check every 6 month PSAs, at least for the first 3 years (through 2027), at which point we may transition to annual thereafter.   - continue q6 mo PSA, next due ~July 2026 - RTC in 1 year

## 2024-08-31 NOTE — Progress Notes (Signed)
" ° °  09/02/2024 11:10 AM   Fernando Frederick February 10, 1956 [id number]  Reason for visit: Follow up prostate Ca   HPI: 69 y.o. male, initial follow up with me today, previously seen by Dr. Penne in Jan 2025  Prior HPI: Hx of prostate Ca  - 12-core TRUS bx (2022) - GS6 in 4/12 cores (up to 100% involvement)   - PSA 3.2 (corrected to 6.4 on proscar )   - MRI prostate - PIRADS 4/5 lesion at left lateral PZ  - MRI fusion bx (Feb 2023) - GS 3+4=7 in 1/13, GS6 in 3/12 cores  - s/p IMRT (Aug 2024) + 102mo ADT  - PSA 0.05 (08/18/23) - ?corrected to 0.1 on finasteride  (appears finasteride  discontinued in Jan 2025)    Physical Exam: BP (!) 152/92   Pulse 62   Ht 5' 10 (1.778 m)   Wt 215 lb (97.5 kg)   BMI 30.85 kg/m    Constitutional:  Alert and oriented, No acute distress.  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  Most recent PSA is 0.2 (08/27/24) which is stable from prior.  This is reassuring and supports continued disease remission.  Would continue to check every 6 month PSAs, at least for the first 3 years (through 2027), at which point we may transition to annual thereafter.   - continue q6 mo PSA, next due ~July 2026 - RTC in 1 year  Orders: -     PSA; Future  Benign  prostatic hyperplasia with nocturia Assessment & Plan: Mild BPH/LUTS  -2-3 episode nocturia  -Drinks fluids until bedtime and throughout the night, by mouth  -Minimal to mild bother at most  Today we reviewed the physiology and common causes of male lower urinary tract symptoms (LUTS). Discussed potential etiologies including infectious, inflammatory, bladder-related, benign prostatic hyperplasia (BPH), and musculoskeletal/pelvic floor contributions. Reviewed the standard diagnostic workup (urinalysis, PVR, uroflow, prostate assessment, possible cystoscopy or imaging) and the spectrum of initial management strategies ranging from behavioral and lifestyle measures to pharmacologic therapy, with procedural options if indicated. All questions were addressed and the patient expressed understanding of the evaluation and treatment pathway.   - Emphasized primarily lifestyle and dietary factors, reduction in nighttime fluids - Reviewed the role of Flomax -he declined at this time - Expectant monitoring, he will let me know if symptoms progress        Penne JONELLE Skye, MD  [facility] [address], [address] [place], [place] [address] [phone] "

## 2024-09-01 ENCOUNTER — Ambulatory Visit
Admission: RE | Admit: 2024-09-01 | Discharge: 2024-09-01 | Disposition: A | Discharge status: Home / Self Care | Source: Ambulatory Visit | Attending: Radiation Oncology | Admitting: Radiation Oncology

## 2024-09-01 ENCOUNTER — Encounter: Payer: Self-pay | Admitting: Radiation Oncology

## 2024-09-01 ENCOUNTER — Other Ambulatory Visit: Payer: Self-pay | Admitting: *Deleted

## 2024-09-01 VITALS — BP 136/89 | HR 63 | Temp 98.2°F | Resp 12 | Wt 215.0 lb

## 2024-09-01 DIAGNOSIS — C61 Malignant neoplasm of prostate: Secondary | ICD-10-CM | POA: Insufficient documentation

## 2024-09-01 DIAGNOSIS — Z923 Personal history of irradiation: Secondary | ICD-10-CM | POA: Diagnosis not present

## 2024-09-01 NOTE — Progress Notes (Signed)
 Radiation Oncology Follow up Note  Name: Fernando Frederick   Date:   09/01/2024 MRN:  [id number] DOB: May 30, 1956    This 69 y.o. male presents to the clinic today for 27-month follow-up status post IMRT radiation therapy to his prostate for Gleason 6 adenocarcinoma presented with a PSA in the 6 range.  REFERRING PROVIDER: Valerio Melanie DASEN, NP  HPI: Patient is a 69 year old male now out 16 months having completed IMRT radiation therapy for Gleason 6 adenocarcinoma the prostate.  Seen today in routine follow-up he is doing well.  He specifically denies any increased lower urinary tract symptoms diarrhea or fatigue.  His most recent PSA is 0.2..  COMPLICATIONS OF TREATMENT: none  FOLLOW UP COMPLIANCE: keeps appointments   PHYSICAL EXAM:  BP 136/89   Pulse 63   Temp 98.2 F (36.8 C) (Tympanic)   Resp 12   Wt 215 lb (97.5 kg)   BMI 29.99 kg/m  Well-developed well-nourished patient in NAD. HEENT reveals PERLA, EOMI, discs not visualized.  Oral cavity is clear. No oral mucosal lesions are identified. Neck is clear without evidence of cervical or supraclavicular adenopathy. Lungs are clear to A&P. Cardiac examination is essentially unremarkable with regular rate and rhythm without murmur rub or thrill. Abdomen is benign with no organomegaly or masses noted. Motor sensory and DTR levels are equal and symmetric in the upper and lower extremities. Cranial nerves II through XII are grossly intact. Proprioception is intact. No peripheral adenopathy or edema is identified. No motor or sensory levels are noted. Crude visual fields are within normal range.  RADIOLOGY RESULTS: No current films for review  PLAN: The present time patient is under excellent biochemical control of his prostate cancer I will go out a whole year and repeat his PSA before his next follow-up visit.  He continues follow-up care with urology.  Patient knows to call with any concerns at any time.  I would like to take this  opportunity to thank you for allowing me to participate in the care of your patient.SABRA Marcey Penton, MD

## 2024-09-02 ENCOUNTER — Ambulatory Visit: Admitting: Urology

## 2024-09-02 VITALS — BP 152/92 | HR 62 | Ht 70.0 in | Wt 215.0 lb

## 2024-09-02 DIAGNOSIS — R351 Nocturia: Secondary | ICD-10-CM

## 2024-09-02 DIAGNOSIS — N401 Enlarged prostate with lower urinary tract symptoms: Secondary | ICD-10-CM | POA: Diagnosis not present

## 2024-09-02 DIAGNOSIS — C61 Malignant neoplasm of prostate: Secondary | ICD-10-CM

## 2024-09-02 DIAGNOSIS — N400 Enlarged prostate without lower urinary tract symptoms: Secondary | ICD-10-CM | POA: Insufficient documentation

## 2024-09-02 NOTE — Assessment & Plan Note (Signed)
 Mild BPH/LUTS  -2-3 episode nocturia  -Drinks fluids until bedtime and throughout the night, by mouth  -Minimal to mild bother at most  Today we reviewed the physiology and common causes of male lower urinary tract symptoms (LUTS). Discussed potential etiologies including infectious, inflammatory, bladder-related, benign prostatic hyperplasia (BPH), and musculoskeletal/pelvic floor contributions. Reviewed the standard diagnostic workup (urinalysis, PVR, uroflow, prostate assessment, possible cystoscopy or imaging) and the spectrum of initial management strategies ranging from behavioral and lifestyle measures to pharmacologic therapy, with procedural options if indicated. All questions were addressed and the patient expressed understanding of the evaluation and treatment pathway.   - Emphasized primarily lifestyle and dietary factors, reduction in nighttime fluids - Reviewed the role of Flomax -he declined at this time - Expectant monitoring, he will let me know if symptoms progress

## 2024-10-29 ENCOUNTER — Ambulatory Visit: Admitting: Cardiology

## 2024-12-03 ENCOUNTER — Ambulatory Visit: Admitting: Nurse Practitioner

## 2024-12-08 ENCOUNTER — Ambulatory Visit: Admitting: Nurse Practitioner

## 2025-08-11 ENCOUNTER — Ambulatory Visit

## 2025-09-01 ENCOUNTER — Ambulatory Visit: Admitting: Radiation Oncology

## 2025-09-02 ENCOUNTER — Other Ambulatory Visit

## 2025-09-09 ENCOUNTER — Ambulatory Visit: Admitting: Urology

## 2026-08-23 ENCOUNTER — Inpatient Hospital Stay
